# Patient Record
Sex: Female | Born: 1985 | Hispanic: No | Marital: Single | Smoking: Never smoker
Health system: Southern US, Community
[De-identification: ages and names within clinical notes are randomized; demographics above are authoritative.]

## PROBLEM LIST (undated history)

## (undated) ENCOUNTER — Inpatient Hospital Stay: Payer: Self-pay

## (undated) DIAGNOSIS — Z6741 Type O blood, Rh negative: Secondary | ICD-10-CM

## (undated) DIAGNOSIS — T7840XA Allergy, unspecified, initial encounter: Secondary | ICD-10-CM

## (undated) DIAGNOSIS — R519 Headache, unspecified: Secondary | ICD-10-CM

## (undated) DIAGNOSIS — O09899 Supervision of other high risk pregnancies, unspecified trimester: Secondary | ICD-10-CM

## (undated) DIAGNOSIS — Z803 Family history of malignant neoplasm of breast: Secondary | ICD-10-CM

## (undated) DIAGNOSIS — O26899 Other specified pregnancy related conditions, unspecified trimester: Secondary | ICD-10-CM

## (undated) DIAGNOSIS — R51 Headache: Secondary | ICD-10-CM

## (undated) DIAGNOSIS — O99210 Obesity complicating pregnancy, unspecified trimester: Secondary | ICD-10-CM

## (undated) DIAGNOSIS — Z98891 History of uterine scar from previous surgery: Secondary | ICD-10-CM

## (undated) DIAGNOSIS — Z6791 Unspecified blood type, Rh negative: Secondary | ICD-10-CM

## (undated) HISTORY — DX: Type O blood, Rh negative: Z67.41

## (undated) HISTORY — DX: Obesity complicating pregnancy, unspecified trimester: O99.210

## (undated) HISTORY — DX: Allergy, unspecified, initial encounter: T78.40XA

## (undated) HISTORY — DX: Other specified pregnancy related conditions, unspecified trimester: O26.899

## (undated) HISTORY — DX: Supervision of other high risk pregnancies, unspecified trimester: O09.899

## (undated) HISTORY — DX: Family history of malignant neoplasm of breast: Z80.3

## (undated) HISTORY — DX: Unspecified blood type, rh negative: Z67.91

## (undated) HISTORY — DX: History of uterine scar from previous surgery: Z98.891

---

## 2014-11-25 ENCOUNTER — Emergency Department: Payer: Self-pay | Admitting: Emergency Medicine

## 2015-05-23 ENCOUNTER — Ambulatory Visit (INDEPENDENT_AMBULATORY_CARE_PROVIDER_SITE_OTHER): Payer: 59 | Admitting: Family Medicine

## 2015-05-23 ENCOUNTER — Encounter: Payer: Self-pay | Admitting: Family Medicine

## 2015-05-23 VITALS — BP 118/82 | HR 79 | Temp 98.1°F | Resp 16 | Ht 65.25 in | Wt 190.7 lb

## 2015-05-23 DIAGNOSIS — N926 Irregular menstruation, unspecified: Secondary | ICD-10-CM | POA: Insufficient documentation

## 2015-05-23 DIAGNOSIS — Z2821 Immunization not carried out because of patient refusal: Secondary | ICD-10-CM

## 2015-05-23 DIAGNOSIS — Z572 Occupational exposure to dust: Secondary | ICD-10-CM

## 2015-05-23 MED ORDER — FLUTICASONE FUROATE-VILANTEROL 100-25 MCG/INH IN AEPB: 1.0000 | INHALATION_SPRAY | Freq: Every day | RESPIRATORY_TRACT | Status: DC

## 2015-05-23 MED ORDER — MONTELUKAST SODIUM 10 MG PO TABS: 10.0000 mg | ORAL_TABLET | Freq: Every day | ORAL | Status: DC

## 2015-05-23 NOTE — Progress Notes (Signed)
Name: Loretta Jensen   MRN: [id number]    DOB: [birth year]   Date:05/23/2015       Progress Note  Subjective  Chief Complaint  Chief Complaint  Patient presents with  . Establish Care  . Breathing Problem    patient has had some issues breathing at her place of employment. she stated that she works aroung a lot of dust and had some shortness of breath.    HPI  Loretta Jensen is a 29 year old female who is here today to establish care. She reports no significant past medical chronic conditions. Denies history of asthma but does have seasonal allergies which changes in season. Works at [employer] with frequent aerosols of particles, including dust, and chemicals. Does not wear masks. For the past 1-2 weeks has had periods of chest tightness and difficulty taking deep satisfying breaths only while at work. Works at [employer] shifts. Not associated with chest pain, palpitations, dizziness, syncope, cough, sinus drainage.  In addition she has had more stress at work. During the month of July this year she had her menses twice, regular flow without clotting. Has had her thyroid checked 2 years ago normal.  She has also had a CXR that was normal 11/2014.   Active Ambulatory Problems    Diagnosis Date Noted  . Occupational exposure to dust 05/23/2015  . Irregular menstrual bleeding 05/23/2015  . Obesity, Class I, BMI 30-34.9 05/23/2015  . Influenza vaccination declined by patient 05/23/2015   Resolved Ambulatory Problems    Diagnosis Date Noted  . No Resolved Ambulatory Problems   Past Medical History  Diagnosis Date  . Allergy    Social History  Substance Use Topics  . Smoking status: Never Smoker   . Smokeless tobacco: Not on file  . Alcohol Use: 0.0 oz/week    0 Standard drinks or equivalent per week     Comment: occasionally     Current outpatient prescriptions:  .  diphenhydrAMINE (BENADRYL) 25 MG tablet, Take 25 mg by mouth every 6 (six) hours as needed., Disp: ,  Rfl:   Past Surgical History  Procedure Laterality Date  . Cesarean section  2009    Family History  Problem Relation Age of Onset  . Cancer Mother     No Known Allergies   Review of Systems  CONSTITUTIONAL: No significant weight changes, fever, chills, weakness or fatigue.  HEENT:  - Eyes: No visual changes.  - Ears: No auditory changes. No pain.  - Nose: No sneezing, congestion, runny nose. - Throat: No sore throat. No changes in swallowing. SKIN: No rash or itching.  CARDIOVASCULAR: No chest pain, chest pressure or chest discomfort. No palpitations or edema.  RESPIRATORY: No shortness of breath, cough or sputum.  GASTROINTESTINAL: No anorexia, nausea, vomiting. No changes in bowel habits. No abdominal pain or blood.  GENITOURINARY: No dysuria. No frequency. No discharge.  NEUROLOGICAL: No headache, dizziness, syncope, paralysis, ataxia, numbness or tingling in the extremities. No memory changes. No change in bowel or bladder control.  MUSCULOSKELETAL: No joint pain. No muscle pain. HEMATOLOGIC: No anemia, bleeding or bruising.  LYMPHATICS: No enlarged lymph nodes.  PSYCHIATRIC: No change in mood. No change in sleep pattern.  ENDOCRINOLOGIC: No reports of sweating, cold or heat intolerance. No polyuria or polydipsia.     Objective  BP 118/82 mmHg  Pulse 79  Temp(Src) 98.1 F (36.7 C) (Oral)  Resp 16  Ht 5' 5.25" (1.657 m)  Wt 190 lb 11.2 oz (86.501 kg)  BMI 31.50 kg/m2  SpO2 98%  LMP 04/29/2015 (Approximate) Body mass index is 31.5 kg/(m^2).  Physical Exam  Constitutional: Patient is overweight and well-nourished. In no distress.  HEENT:  - Head: Normocephalic and atraumatic.  - Ears: Bilateral TMs gray, no erythema or effusion - Nose: Nasal mucosa moist, boggy. - Mouth/Throat: Oropharynx is clear and moist. No tonsillar hypertrophy or erythema. No post nasal drainage.  - Eyes: Conjunctivae clear, EOM movements normal. PERRLA. No scleral icterus.  Neck:  Normal range of motion. Neck supple. No JVD present. No thyromegaly present.  Cardiovascular: Normal rate, regular rhythm and normal heart sounds.  No murmur heard.  Pulmonary/Chest: Effort normal and breath sounds normal. No respiratory distress. Musculoskeletal: Normal range of motion bilateral UE and LE, no joint effusions. Peripheral vascular: Bilateral LE no edema. Neurological: CN II-XII grossly intact with no focal deficits. Alert and oriented to person, place, and time. Coordination, balance, strength, speech and gait are normal.  Skin: Skin is warm and dry. No rash noted. No erythema.  Psychiatric: Patient has a normal mood and affect. Behavior is normal in office today. Judgment and thought content normal in office today.   Assessment & Plan  1. Occupational exposure to dust Recommend wearing mask while at work. Clinical exam unremarkable. May have irritative inflammation within lung field will start her on inhaled corticosteroid for 2 weeks and wean off.  Start singulair daily.  If symptoms continue will get CXR and PFTs.  - Fluticasone Furoate-Vilanterol 100-25 MCG/INH AEPB; Inhale 1 puff into the lungs daily.  Dispense: 60 each; Refill: 1 - montelukast (SINGULAIR) 10 MG tablet; Take 1 tablet (10 mg total) by mouth at bedtime.  Dispense: 30 tablet; Refill: 3  2. Irregular menstrual bleeding Likely stress related as they have normalized now. If recurrent will get lab work.

## 2015-06-20 ENCOUNTER — Encounter: Payer: 59 | Admitting: Family Medicine

## 2015-06-28 ENCOUNTER — Ambulatory Visit
Admission: RE | Admit: 2015-06-28 | Discharge: 2015-06-28 | Disposition: A | Discharge status: Home / Self Care | Payer: 59 | Source: Ambulatory Visit | Attending: Family Medicine | Admitting: Family Medicine

## 2015-06-28 ENCOUNTER — Encounter: Payer: Self-pay | Admitting: Family Medicine

## 2015-06-28 ENCOUNTER — Telehealth: Payer: Self-pay

## 2015-06-28 ENCOUNTER — Encounter (INDEPENDENT_AMBULATORY_CARE_PROVIDER_SITE_OTHER): Payer: Self-pay

## 2015-06-28 ENCOUNTER — Ambulatory Visit (INDEPENDENT_AMBULATORY_CARE_PROVIDER_SITE_OTHER): Payer: 59 | Admitting: Family Medicine

## 2015-06-28 VITALS — BP 122/68 | HR 83 | Temp 98.7°F | Resp 16 | Ht 65.0 in | Wt 194.7 lb

## 2015-06-28 DIAGNOSIS — Z13220 Encounter for screening for lipoid disorders: Secondary | ICD-10-CM

## 2015-06-28 DIAGNOSIS — R05 Cough: Secondary | ICD-10-CM | POA: Diagnosis present

## 2015-06-28 DIAGNOSIS — E669 Obesity, unspecified: Secondary | ICD-10-CM

## 2015-06-28 DIAGNOSIS — R221 Localized swelling, mass and lump, neck: Secondary | ICD-10-CM

## 2015-06-28 DIAGNOSIS — Z124 Encounter for screening for malignant neoplasm of cervix: Secondary | ICD-10-CM

## 2015-06-28 DIAGNOSIS — R059 Cough, unspecified: Secondary | ICD-10-CM | POA: Insufficient documentation

## 2015-06-28 DIAGNOSIS — Z136 Encounter for screening for cardiovascular disorders: Secondary | ICD-10-CM

## 2015-06-28 DIAGNOSIS — Z113 Encounter for screening for infections with a predominantly sexual mode of transmission: Secondary | ICD-10-CM

## 2015-06-28 DIAGNOSIS — IMO0001 Reserved for inherently not codable concepts without codable children: Secondary | ICD-10-CM

## 2015-06-28 DIAGNOSIS — Z0000 Encounter for general adult medical examination without abnormal findings: Secondary | ICD-10-CM | POA: Diagnosis not present

## 2015-06-28 DIAGNOSIS — R09A2 Foreign body sensation, throat: Secondary | ICD-10-CM

## 2015-06-28 DIAGNOSIS — E66811 Obesity, class 1: Secondary | ICD-10-CM

## 2015-06-28 NOTE — Telephone Encounter (Signed)
Patient returned my call so that I could review Dr. Sherley Bounds' message:  Notes Recorded by Edwena Felty, MD on 06/28/2015 at 1:09 PM Let patient know CXR and Neck X-ray were normal did not show mass or swelling to explain the lump in her throat sensation. I want her to start claritin, zyrtec or allegra found OTC daily, gargle with warm salt water or mouth wash twice daily. If symptoms do not improve call me and I will refer her to ENT specialist to look down her throat.  Patient agreed and said ok. Sh also mentioned that she will have her blood work done on this Friday.

## 2015-06-28 NOTE — Progress Notes (Signed)
Name: Loretta Jensen   MRN: [id number]    DOB: 1986/06/10   Date:06/28/2015       Progress Note  Subjective  Chief Complaint  Chief Complaint  Patient presents with  . Annual Exam    HPI  Patient is here today for a Complete Female Physical Exam:  The patient has has no unusual complaints and complains of lump in throat sensation still. Overall feels healthy. Diet is well balanced. In general does not exercise regularly. Sees dentist regularly and addresses vision concerns with ophthalmologist if applicable. In regards to sexual activity the patient is currently sexually active. Currently is concerned about exposure to any STDs.   Menstrual history is positive for irregular menses.   If you recall, Letica works at [employer] with frequent aerosols of particles, including dust, and chemicals. Does not wear masks but at our last visit she was encouraged to wear a mask while working which she reports has improved her chest tightness and breathing. Works at [employer] shifts usually but recently this has changed.  She is complaining of lump sensation down her throat at the base of her neck. Not associated with chest pain, palpitations, dizziness, syncope, choking. In addition she has had more stress at work. Has had her thyroid checked 2 years ago normal. She has also had a CXR that was normal 11/2014.   Past Medical History  Diagnosis Date  . Allergy     Past Surgical History  Procedure Laterality Date  . Cesarean section  2009    Family History  Problem Relation Age of Onset  . Cancer Mother     Social History   Social History  . Marital Status: Single    Spouse Name: N/A  . Number of Children: N/A  . Years of Education: N/A   Occupational History  . Not on file.   Social History Main Topics  . Smoking status: Never Smoker   . Smokeless tobacco: Not on file  . Alcohol Use: 0.0 oz/week    0 Standard drinks or equivalent per week     Comment: occasionally  .  Drug Use: No  . Sexual Activity:    Partners: Male   Other Topics Concern  . Not on file   Social History Narrative     Current outpatient prescriptions:  .  diphenhydrAMINE (BENADRYL) 25 MG tablet, Take 25 mg by mouth every 6 (six) hours as needed., Disp: , Rfl:  .  Fluticasone Furoate-Vilanterol 100-25 MCG/INH AEPB, Inhale 1 puff into the lungs daily., Disp: 60 each, Rfl: 1 .  montelukast (SINGULAIR) 10 MG tablet, Take 1 tablet (10 mg total) by mouth at bedtime., Disp: 30 tablet, Rfl: 3  No Known Allergies  ROS  CONSTITUTIONAL: No significant weight changes, fever, chills, weakness or fatigue.  HEENT:  - Eyes: No visual changes.  - Ears: No auditory changes. No pain.  - Nose: No sneezing, congestion, runny nose. - Throat: No sore throat. No changes in swallowing. Lump sensation in throat. SKIN: No rash or itching.  CARDIOVASCULAR: No chest pain, chest pressure or chest discomfort. No palpitations or edema.  RESPIRATORY: No shortness of breath, cough or sputum.  GASTROINTESTINAL: No anorexia, nausea, vomiting. No changes in bowel habits. No abdominal pain or blood.  GENITOURINARY: No dysuria. No frequency. No discharge.  NEUROLOGICAL: No headache, dizziness, syncope, paralysis, ataxia, numbness or tingling in the extremities. No memory changes. No change in bowel or bladder control.  MUSCULOSKELETAL: No joint pain. No muscle pain. HEMATOLOGIC: No  anemia, bleeding or bruising.  LYMPHATICS: No enlarged lymph nodes.  PSYCHIATRIC: No change in mood. No change in sleep pattern.  ENDOCRINOLOGIC: No reports of sweating, cold or heat intolerance. No polyuria or polydipsia.   Objective  Filed Vitals:   06/28/15 1110  BP: 122/68  Pulse: 83  Temp: 98.7 F (37.1 C)  TempSrc: Oral  Resp: 16  Height:  (1.651 m)  Weight: 194 lb 11.2 oz (88.315 kg)  SpO2: 98%   Body mass index is 32.4 kg/(m^2).  Depression screen [facility] 2/9 06/28/2015 05/23/2015  Decreased Interest 0 0  Down,  Depressed, Hopeless 0 0  PHQ - 2 Score 0 0     Physical Exam  Constitutional: Patient obese and well-nourished. In no distress.  HEENT:  - Head: Normocephalic and atraumatic.  - Ears: Bilateral TMs gray, no erythema or effusion - Nose: Nasal mucosa moist - Mouth/Throat: Oropharynx is clear and moist. No tonsillar hypertrophy or erythema. Positive for tonsil stones. No post nasal drainage.  - Eyes: Conjunctivae clear, EOM movements normal. PERRLA. No scleral icterus.  Neck: Normal range of motion. Neck supple. No JVD present. No thyromegaly present.  Cardiovascular: Normal rate, regular rhythm and normal heart sounds.  No murmur heard.  Pulmonary/Chest: Effort normal and breath sounds normal. No respiratory distress. Abdominal: Soft. Bowel sounds are normal, no distension. There is no tenderness. no masses BREAST: Bilateral breast exam normal with no masses, skin changes or nipple discharge FEMALE GENITALIA:  External genitalia normal External urethra normal Vaginal vault normal without discharge or lesions Cervix normal without discharge or lesions Bimanual exam normal without masses RECTAL: no rectal masses or hemorrhoids Musculoskeletal: Normal range of motion bilateral UE and LE, no joint effusions. Peripheral vascular: Bilateral LE no edema. Neurological: CN II-XII grossly intact with no focal deficits. Alert and oriented to person, place, and time. Coordination, balance, strength, speech and gait are normal.  Skin: Skin is warm and dry. No rash noted. No erythema.  Psychiatric: Patient has a normal mood and affect. Behavior is normal in office today. Judgment and thought content normal in office today.   Assessment & Plan  1. Annual physical exam Discussed long term preventative goals.   2. Encounter for screening for malignant neoplasm of cervix  - Pap IG w/ reflex to HPV when ASC-U  3. Encounter for cholesteral screening for cardiovascular disease  - Lipid  panel  4. Screening for STD (sexually transmitted disease)  - Chlamydia/Gonococcus/Trichomonas, NAA - HIV antibody (with reflex)  5. Cough Not so much a classical cough more like irritation in her throat that makes her want to cough or clear her throat. Will get CXR and neck X-ray. If symptoms ongoing may benefit from ENT evaluation.  - CBC with Differential/Platelet - Comprehensive metabolic panel - TSH - DG Chest 2 View; Future - DG Neck Soft Tissue; Future  6. Obesity, Class I, BMI 30-34.9 The patient has been counseled on their higher than normal BMI.  They have verbally expressed understanding their increased risk for other diseases.  In efforts to meet a better target BMI goal the patient has been counseled on lifestyle, diet and exercise modification tactics. Start with moderate intensity aerobic exercise (walking, jogging, elliptical, swimming, group or individual sports, hiking) at least a day at least 4 days a week and increase intensity, duration, frequency as tolerated. Diet should include well balance fresh fruits and vegetables avoiding processed foods, carbohydrates and sugars. Drink at least 8oz 10 glasses a day avoiding sodas,  sugary fruit drinks, sweetened tea. Check weight on a reliable scale daily and monitor weight loss progress daily. Consider investing in mobile phone apps that will help keep track of weight loss goals.  - TSH  7. Sensation of lump in throat Will get CXR and neck X-ray. If symptoms ongoing may benefit from ENT evaluation.  - DG Neck Soft Tissue; Future

## 2015-06-30 LAB — CHLAMYDIA/GONOCOCCUS/TRICHOMONAS, NAA
Chlamydia by NAA: NEGATIVE
Gonococcus by NAA: NEGATIVE
Trich vag by NAA: NEGATIVE

## 2015-07-01 LAB — LIPID PANEL
CHOL/HDL RATIO: 2.3 ratio (ref 0.0–4.4)
CHOLESTEROL TOTAL: 131 mg/dL (ref 100–199)
HDL: 57 mg/dL (ref >39)
LDL CALC: 63 mg/dL (ref 0–99)
TRIGLYCERIDES: 55 mg/dL (ref 0–149)
VLDL Cholesterol Cal: 11 mg/dL (ref 5–40)

## 2015-07-01 LAB — COMPREHENSIVE METABOLIC PANEL
ALK PHOS: 60 IU/L (ref 39–117)
ALT: 16 IU/L (ref 0–32)
AST: 14 IU/L (ref 0–40)
Albumin/Globulin Ratio: 1.4 (ref 1.1–2.5)
Albumin: 3.9 g/dL (ref 3.5–5.5)
BUN/Creatinine Ratio: 15 (ref 8–20)
BUN: 10 mg/dL (ref 6–20)
Bilirubin Total: 0.3 mg/dL (ref 0.0–1.2)
CALCIUM: 9 mg/dL (ref 8.7–10.2)
CO2: 23 mmol/L (ref 18–29)
CREATININE: 0.67 mg/dL (ref 0.57–1.00)
Chloride: 102 mmol/L (ref 97–108)
GFR calc Af Amer: 139 mL/min/{1.73_m2} (ref >59)
GFR, EST NON AFRICAN AMERICAN: 121 mL/min/{1.73_m2} (ref >59)
Globulin, Total: 2.8 g/dL (ref 1.5–4.5)
Glucose: 76 mg/dL (ref 65–99)
POTASSIUM: 3.9 mmol/L (ref 3.5–5.2)
Sodium: 141 mmol/L (ref 134–144)
Total Protein: 6.7 g/dL (ref 6.0–8.5)

## 2015-07-01 LAB — CBC WITH DIFFERENTIAL/PLATELET
BASOS ABS: 0 10*3/uL (ref 0.0–0.2)
Basos: 0 %
EOS (ABSOLUTE): 0.1 10*3/uL (ref 0.0–0.4)
Eos: 1 %
Hematocrit: 35.4 % (ref 34.0–46.6)
Hemoglobin: 11.8 g/dL (ref 11.1–15.9)
IMMATURE GRANULOCYTES: 1 %
Immature Grans (Abs): 0.1 10*3/uL (ref 0.0–0.1)
Lymphocytes Absolute: 2.4 10*3/uL (ref 0.7–3.1)
Lymphs: 24 %
MCH: 27.1 pg (ref 26.6–33.0)
MCHC: 33.3 g/dL (ref 31.5–35.7)
MCV: 81 fL (ref 79–97)
MONOS ABS: 0.5 10*3/uL (ref 0.1–0.9)
Monocytes: 5 %
NEUTROS PCT: 69 %
Neutrophils Absolute: 7.2 10*3/uL — ABNORMAL HIGH (ref 1.4–7.0)
PLATELETS: 311 10*3/uL (ref 150–379)
RBC: 4.36 x10E6/uL (ref 3.77–5.28)
RDW: 14.3 % (ref 12.3–15.4)
WBC: 10.3 10*3/uL (ref 3.4–10.8)

## 2015-07-01 LAB — HIV ANTIBODY (ROUTINE TESTING W REFLEX): HIV SCREEN 4TH GENERATION: NONREACTIVE

## 2015-07-01 LAB — TSH: TSH: 1.4 u[IU]/mL (ref 0.450–4.500)

## 2015-07-02 LAB — PAP IG W/ RFLX HPV ASCU: PAP SMEAR COMMENT: 0

## 2015-09-01 ENCOUNTER — Ambulatory Visit (INDEPENDENT_AMBULATORY_CARE_PROVIDER_SITE_OTHER): Payer: 59

## 2015-09-01 DIAGNOSIS — Z23 Encounter for immunization: Secondary | ICD-10-CM

## 2015-09-29 ENCOUNTER — Ambulatory Visit (INDEPENDENT_AMBULATORY_CARE_PROVIDER_SITE_OTHER): Payer: 59 | Admitting: Family Medicine

## 2015-09-29 ENCOUNTER — Encounter: Payer: Self-pay | Admitting: Family Medicine

## 2015-09-29 ENCOUNTER — Telehealth: Payer: Self-pay | Admitting: Family Medicine

## 2015-09-29 VITALS — BP 118/82 | HR 89 | Temp 98.4°F | Resp 16 | Ht 65.0 in | Wt 201.7 lb

## 2015-09-29 DIAGNOSIS — J0100 Acute maxillary sinusitis, unspecified: Secondary | ICD-10-CM | POA: Diagnosis not present

## 2015-09-29 DIAGNOSIS — R059 Cough, unspecified: Secondary | ICD-10-CM

## 2015-09-29 DIAGNOSIS — R05 Cough: Secondary | ICD-10-CM | POA: Diagnosis not present

## 2015-09-29 DIAGNOSIS — H65112 Acute and subacute allergic otitis media (mucoid) (sanguinous) (serous), left ear: Secondary | ICD-10-CM

## 2015-09-29 MED ORDER — AMOXICILLIN-POT CLAVULANATE 875-125 MG PO TABS: 1.0000 | ORAL_TABLET | Freq: Two times a day (BID) | ORAL | Status: DC

## 2015-09-29 MED ORDER — BENZONATATE 200 MG PO CAPS: 200.0000 mg | ORAL_CAPSULE | Freq: Three times a day (TID) | ORAL | Status: DC | PRN

## 2015-09-29 NOTE — Progress Notes (Signed)
Name: Loretta Jensen   MRN: [id number]    DOB: 1986-01-21   Date:09/29/2015       Progress Note  Subjective  Chief Complaint  Chief Complaint  Patient presents with  . URI    HPI  Patient is here today with concerns regarding the following symptoms sore throat, congestion, post nasal drip, sneezing, ear pressure, sinus pressure, productive cough, achiness and low grade fevers that started 3 days ago.  Associated with chills, sweats, fatigue and anorexia. Has tried the following home remedies: Mucinex, Robatussion, Nyquil.   Past Medical History  Diagnosis Date  . Allergy     Social History  Substance Use Topics  . Smoking status: Never Smoker   . Smokeless tobacco: Not on file  . Alcohol Use: 0.0 oz/week    0 Standard drinks or equivalent per week     Comment: occasionally     Current outpatient prescriptions:  .  diphenhydrAMINE (BENADRYL) 25 MG tablet, Take 25 mg by mouth every 6 (six) hours as needed., Disp: , Rfl:  .  Fluticasone Furoate-Vilanterol 100-25 MCG/INH AEPB, Inhale 1 puff into the lungs daily., Disp: 60 each, Rfl: 1 .  montelukast (SINGULAIR) 10 MG tablet, Take 1 tablet (10 mg total) by mouth at bedtime., Disp: 30 tablet, Rfl: 3  No Known Allergies  ROS  Positive for fatigue, nasal congestion, sinus pressure, ear fullness, cough as mentioned in HPI, otherwise all systems reviewed and are negative.  Objective  Filed Vitals:   09/29/15 1153  BP: 118/82  Pulse: 89  Temp: 98.4 F (36.9 C)  TempSrc: Oral  Resp: 16  Height: 5\' 5"  (1.651 m)  Weight: 201 lb 11.2 oz (91.491 kg)  SpO2: 98%   Body mass index is 33.56 kg/(m^2).   Physical Exam  Constitutional: Patient appears overweight and well-nourished. In no acute distress but does appear to be fatigued from acute illness. HEENT:  - Head: Normocephalic and atraumatic.  - Ears: RIGHT TM bulging with minimal clear exudate, LEFT TM mild erythema with bulging and fluid build up - Nose: Nasal  mucosa boggy and congested.  - Mouth/Throat: Oropharynx is moist with slight erythema of bilateral tonsils without hypertrophy or exudates. Post nasal drainage present.  - Eyes: Conjunctivae clear, EOM movements normal. PERRLA. No scleral icterus.  Neck: Normal range of motion. Neck supple. No JVD present. No thyromegaly present. No local lymphadenopathy. Cardiovascular: Regular rate, regular rhythm with no murmurs heard.  Pulmonary/Chest: Effort normal and breath sounds clear in all lung fields.  Musculoskeletal: Normal range of motion bilateral UE and LE, no joint effusions. Skin: Skin is warm and dry. No rash noted. Psychiatric: Patient has a normal mood and affect. Behavior is normal in office today. Judgment and thought content normal in office today.   Assessment & Plan  1. Acute allergic otitis media of left ear, recurrence not specified Etiologies include initial allergic rhinitis or viral infection progressing to superimposed bacterial infection. Instructed patient on increasing hydration, nasal saline spray, steam inhalation, NSAID if tolerated and not contraindicated. If not already doing so start taking daily anti-histamine and use a steroid nasal spray. If symptoms persist/worsen may consider antibiotic therapy.  - amoxicillin-clavulanate (AUGMENTIN) 875-125 MG tablet; Take 1 tablet by mouth 2 (two) times daily.  Dispense: 20 tablet; Refill: 0  2. Acute maxillary sinusitis, recurrence not specified  - amoxicillin-clavulanate (AUGMENTIN) 875-125 MG tablet; Take 1 tablet by mouth 2 (two) times daily.  Dispense: 20 tablet; Refill: 0  3. Cough  - benzonatate (  TESSALON) 200 MG capsule; Take 1 capsule (200 mg total) by mouth 3 (three) times daily as needed for cough.  Dispense: 30 capsule; Refill: 0

## 2015-09-29 NOTE — Telephone Encounter (Signed)
Contacted  Patient, she wanted to see if she could get antibiotic in liquid form.  I told her Dr. Sherley BoundsSundaram had already left for the weekend and might not get to her messages until next week.

## 2015-09-29 NOTE — Telephone Encounter (Signed)
Patient stated that she was just seen but had a question about her medication.

## 2016-07-30 LAB — OB RESULTS CONSOLE HGB/HCT, BLOOD
HCT: 36 %
Hemoglobin: 12.1 g/dL

## 2016-07-30 LAB — SICKLE CELL SCREEN: Sickle Cell Screen: NORMAL

## 2016-07-30 LAB — OB RESULTS CONSOLE ABO/RH: RH TYPE: NEGATIVE

## 2016-07-30 LAB — OB RESULTS CONSOLE PLATELET COUNT: Platelets: 300 10*3/uL

## 2016-07-30 LAB — OB RESULTS CONSOLE RPR: RPR: NONREACTIVE

## 2016-07-30 LAB — OB RESULTS CONSOLE RUBELLA ANTIBODY, IGM: RUBELLA: IMMUNE

## 2016-07-30 LAB — OB RESULTS CONSOLE HEPATITIS B SURFACE ANTIGEN: Hepatitis B Surface Ag: NEGATIVE

## 2016-07-30 LAB — HM PAP SMEAR

## 2016-07-30 LAB — OB RESULTS CONSOLE GC/CHLAMYDIA
Chlamydia: NEGATIVE
Gonorrhea: NEGATIVE

## 2016-07-30 LAB — OB RESULTS CONSOLE VARICELLA ZOSTER ANTIBODY, IGG: VARICELLA IGG: IMMUNE

## 2016-07-30 LAB — OB RESULTS CONSOLE HIV ANTIBODY (ROUTINE TESTING): HIV: NONREACTIVE

## 2016-11-27 ENCOUNTER — Telehealth: Payer: Self-pay

## 2016-11-27 NOTE — Telephone Encounter (Signed)
Pt is currently 26 wks. C/o pelvic pain and hurts to stand. Left msg for pt to call back.

## 2016-11-27 NOTE — Telephone Encounter (Signed)
Pt states she had a sharp, throbbing pain in stomach area on Tuesday morning around 3:00 or 4:00 but has since subsided. She works 12 hour shifts. Advised hydration, rest and maternity support belt if needed. Pt also c/o nausea. Pt encouraged to RTO prior to next appt if sxs worsen or persist.

## 2016-11-29 ENCOUNTER — Telehealth: Payer: Self-pay

## 2016-11-29 NOTE — Telephone Encounter (Signed)
Pt called triage again today (see previous task from 3/7) with same pelvic S&S. She is an Ob, Please call and schedule an appointment for today. Cb# [phone]

## 2016-12-02 NOTE — Telephone Encounter (Signed)
Pt called Frk.  Is still waiting on callback.

## 2016-12-02 NOTE — Telephone Encounter (Signed)
Attemped to call and schedule pt. Voicemail box not set up.

## 2016-12-03 NOTE — Telephone Encounter (Signed)
Pt calling triage again. Requests call back on work number to schedule appt at [phone] thank you

## 2016-12-04 ENCOUNTER — Ambulatory Visit (INDEPENDENT_AMBULATORY_CARE_PROVIDER_SITE_OTHER): Payer: 59 | Admitting: Obstetrics & Gynecology

## 2016-12-04 ENCOUNTER — Encounter: Payer: Self-pay | Admitting: Obstetrics & Gynecology

## 2016-12-04 VITALS — BP 110/68 | HR 86 | Wt 190.0 lb

## 2016-12-04 DIAGNOSIS — Z3A27 27 weeks gestation of pregnancy: Secondary | ICD-10-CM

## 2016-12-04 DIAGNOSIS — Z98891 History of uterine scar from previous surgery: Secondary | ICD-10-CM

## 2016-12-04 DIAGNOSIS — N3000 Acute cystitis without hematuria: Secondary | ICD-10-CM

## 2016-12-04 DIAGNOSIS — R1030 Lower abdominal pain, unspecified: Secondary | ICD-10-CM | POA: Diagnosis not present

## 2016-12-04 HISTORY — DX: History of uterine scar from previous surgery: Z98.891

## 2016-12-04 LAB — POCT URINALYSIS DIPSTICK
Bilirubin, UA: NEGATIVE
Clarity, UA: NEGATIVE
GLUCOSE UA: NEGATIVE
Ketones, UA: NEGATIVE
NITRITE UA: NEGATIVE
PH UA: 6.5
Protein, UA: NEGATIVE
RBC UA: NEGATIVE
Spec Grav, UA: 1.01
UROBILINOGEN UA: 0.2

## 2016-12-04 MED ORDER — NITROFURANTOIN MONOHYD MACRO 100 MG PO CAPS: 100.0000 mg | ORAL_CAPSULE | Freq: Two times a day (BID) | ORAL | 1 refills | Status: DC

## 2016-12-04 NOTE — Addendum Note (Signed)
Addended by: Nadara MustardHARRIS, Paxten Appelt P on: 12/04/2016 10:20 AM   Modules accepted: Orders

## 2016-12-04 NOTE — Telephone Encounter (Signed)
Pt is scheduled for 3/14 w/Harris

## 2016-12-04 NOTE — Progress Notes (Addendum)
Gynecology Pelvic Pain Evaluation   Chief Complaint:  Chief Complaint  Patient presents with  . rob    nausea, sharp pain in vagina    History of Present Illness:   Patient is a 31 y.o. G2P1 who LMP was Patient's last menstrual period was 05/29/2016., presents today for a problem visit.  She complains of pain.   Her pain is localized to the vaginal area that radiates up to her lower abdomen; pregnant [redacted] weeks w h/o prior CS area, described as intermittent and sharp, began a week ago and its severity is described as severe, at times. The pain radiates to the  Non-radiating. She has these associated symptoms which include headache. Patient has these modifiers which include nothing that make it better and unable to associate with any factor that make it worse.  Context includes: spontaneous.    PMHx: She  has a past medical history of Allergy. Also,  has a past surgical history that includes Cesarean section (2009)., family history includes Cancer in her mother.,  reports that she has never smoked. She does not have any smokeless tobacco history on file. She reports that she drinks alcohol. She reports that she does not use drugs.  She has a current medication list which includes the following prescription(s): amoxicillin-clavulanate, benzonatate, diphenhydramine, fluticasone furoate-vilanterol, montelukast, and nitrofurantoin (macrocrystal-monohydrate). Also, has No Known Allergies.  Review of Systems  Constitutional: Negative for chills, fever and malaise/fatigue.  HENT: Negative for congestion, sinus pain and sore throat.   Eyes: Negative for blurred vision and pain.  Respiratory: Negative for cough and wheezing.   Cardiovascular: Negative for chest pain and leg swelling.  Gastrointestinal: Negative for abdominal pain, constipation, diarrhea, heartburn, nausea and vomiting.  Genitourinary: Negative for dysuria, frequency, hematuria and urgency.  Musculoskeletal: Negative for back pain,  joint pain, myalgias and neck pain.  Skin: Negative for itching and rash.  Neurological: Negative for dizziness, tremors and weakness.  Endo/Heme/Allergies: Does not bruise/bleed easily.  Psychiatric/Behavioral: Negative for depression. The patient is not nervous/anxious and does not have insomnia.     Objective: BP 110/68   Pulse 86   Wt 190 lb (86.2 kg)   LMP 05/29/2016   BMI 31.62 kg/m  Physical Exam  Constitutional: She is oriented to person, place, and time. She appears well-developed and well-nourished. No distress.  Genitourinary: Rectum normal and vagina normal. Pelvic exam was performed with patient supine. There is no rash or lesion on the right labia. There is no rash or lesion on the left labia. Vagina exhibits no lesion. No bleeding in the vagina.  Cardiovascular: Normal rate.   Pulmonary/Chest: Effort normal.  Abdominal: Soft. Bowel sounds are normal. She exhibits no distension. There is no tenderness. There is no rebound.  Musculoskeletal: Normal range of motion.  Neurological: She is alert and oriented to person, place, and time.  Skin: Skin is warm and dry.  Psychiatric: She has a normal mood and affect.  Vitals reviewed. Cervix closed thick and high Female chaperone present for pelvic portion of the physical exam FHT 140s  Results for orders placed or performed in visit on 12/04/16  POCT urinalysis dipstick  Result Value Ref Range   Color, UA yellow    Clarity, UA neg    Glucose, UA neg    Bilirubin, UA neg    Ketones, UA neg    Spec Grav, UA 1.010 1.003, 1.005, 1.010, 1.015, 1.020, 1.025, 1.030, 1.035   Blood, UA neg    pH, UA 6.5  5.0, 5.5, 6.0, 6.5, 7.0, 7.5, 8.0   Protein, UA neg    Urobilinogen, UA 0.2 0.2, 1.0   Nitrite, UA neg    Leukocytes, UA moderate (2+) (A) Negative   Assessment: 31 y.o. G2P1 with Functional: lower abd and vag pain possibly related ot pregnancy; no s/sx PTL Pregnancy: Prior CS with risk for adhesions.  Problem List Items  Addressed This Visit      Other   History of cesarean delivery    Other Visit Diagnoses    Lower abdominal pain    -  Primary   [redacted] weeks gestation of pregnancy       Acute cystitis without hematuria       Relevant Medications   nitrofurantoin, macrocrystal-monohydrate, (MACROBID) 100 MG capsule    Macrobid ABX for UTI  Annamarie MajorPaul Praneel Haisley, MD, Merlinda FrederickFACOG [facility], [facility] 12/04/2016  10:11 AM

## 2016-12-04 NOTE — Patient Instructions (Signed)

## 2016-12-10 ENCOUNTER — Telehealth: Payer: Self-pay

## 2016-12-10 NOTE — Telephone Encounter (Signed)
Pt aware of [employer] Authoriz[employer] [facility] Copy made and placed on Tonyas desk for chart. KJ CMA

## 2016-12-13 ENCOUNTER — Ambulatory Visit (INDEPENDENT_AMBULATORY_CARE_PROVIDER_SITE_OTHER): Payer: 59 | Admitting: Certified Nurse Midwife

## 2016-12-13 ENCOUNTER — Other Ambulatory Visit: Payer: 59

## 2016-12-13 VITALS — BP 110/62 | Wt 194.0 lb

## 2016-12-13 DIAGNOSIS — O09899 Supervision of other high risk pregnancies, unspecified trimester: Secondary | ICD-10-CM

## 2016-12-13 DIAGNOSIS — Z3A28 28 weeks gestation of pregnancy: Secondary | ICD-10-CM

## 2016-12-13 DIAGNOSIS — Z131 Encounter for screening for diabetes mellitus: Secondary | ICD-10-CM | POA: Diagnosis not present

## 2016-12-13 DIAGNOSIS — Z6791 Unspecified blood type, Rh negative: Secondary | ICD-10-CM

## 2016-12-13 DIAGNOSIS — O0990 Supervision of high risk pregnancy, unspecified, unspecified trimester: Secondary | ICD-10-CM

## 2016-12-13 DIAGNOSIS — Z113 Encounter for screening for infections with a predominantly sexual mode of transmission: Secondary | ICD-10-CM

## 2016-12-13 DIAGNOSIS — Z98891 History of uterine scar from previous surgery: Secondary | ICD-10-CM

## 2016-12-13 DIAGNOSIS — O26899 Other specified pregnancy related conditions, unspecified trimester: Principal | ICD-10-CM

## 2016-12-13 DIAGNOSIS — O0993 Supervision of high risk pregnancy, unspecified, third trimester: Secondary | ICD-10-CM

## 2016-12-13 MED ORDER — RHO D IMMUNE GLOBULIN 1500 UNITS IM SOSY: 1500.0000 [IU] | PREFILLED_SYRINGE | Freq: Once | INTRAMUSCULAR | Status: DC

## 2016-12-13 NOTE — Progress Notes (Signed)
Sharpe pain starts in low stomach and shoots to vagina/28 week labs today/Rhogam given today KJ

## 2016-12-14 LAB — 28 WEEKS RH-PANEL
ANTIBODY SCREEN: NEGATIVE
BASOS ABS: 0 10*3/uL (ref 0.0–0.2)
Basos: 0 %
EOS (ABSOLUTE): 0.1 10*3/uL (ref 0.0–0.4)
Eos: 1 %
Gestational Diabetes Screen: 128 mg/dL (ref 65–139)
HIV Screen 4th Generation wRfx: NONREACTIVE
Hematocrit: 34 % (ref 34.0–46.6)
Hemoglobin: 11.4 g/dL (ref 11.1–15.9)
IMMATURE GRANS (ABS): 0.1 10*3/uL (ref 0.0–0.1)
IMMATURE GRANULOCYTES: 1 %
LYMPHS: 17 %
Lymphocytes Absolute: 1.6 10*3/uL (ref 0.7–3.1)
MCH: 28.3 pg (ref 26.6–33.0)
MCHC: 33.5 g/dL (ref 31.5–35.7)
MCV: 84 fL (ref 79–97)
MONOCYTES: 6 %
MONOS ABS: 0.5 10*3/uL (ref 0.1–0.9)
NEUTROS ABS: 6.9 10*3/uL (ref 1.4–7.0)
NEUTROS PCT: 75 %
PLATELETS: 252 10*3/uL (ref 150–379)
RBC: 4.03 x10E6/uL (ref 3.77–5.28)
RDW: 14.4 % (ref 12.3–15.4)
RPR Ser Ql: NONREACTIVE
WBC: 9.1 10*3/uL (ref 3.4–10.8)

## 2016-12-22 DIAGNOSIS — O0990 Supervision of high risk pregnancy, unspecified, unspecified trimester: Secondary | ICD-10-CM | POA: Insufficient documentation

## 2016-12-22 NOTE — Progress Notes (Signed)
Late note from 3/23/visit. Did not go to work yesterday due to intermittent lower abdominal/ shooting vaginal pains. No bleeding. No contractions. Finishing up antibiotics prescribed at last visit for UTI. Baby active.  History of prior CS for FTP. PROM/ labor augmented but did not progress past 3 cm. Desires VBAC. Explained risks of VBAC vs repeat CS. SHe is aware that labor would not be induced unless ripe cervix. Will schedule repeat CS at 40 weeks in case patient does not go into labor. ROB in 2 weeks.

## 2016-12-26 ENCOUNTER — Other Ambulatory Visit: Payer: Self-pay

## 2016-12-26 DIAGNOSIS — Z6741 Type O blood, Rh negative: Secondary | ICD-10-CM | POA: Insufficient documentation

## 2016-12-30 ENCOUNTER — Ambulatory Visit (INDEPENDENT_AMBULATORY_CARE_PROVIDER_SITE_OTHER): Payer: 59 | Admitting: Obstetrics and Gynecology

## 2016-12-30 VITALS — BP 114/70 | Wt 191.0 lb

## 2016-12-30 DIAGNOSIS — E669 Obesity, unspecified: Secondary | ICD-10-CM

## 2016-12-30 DIAGNOSIS — Z3A30 30 weeks gestation of pregnancy: Secondary | ICD-10-CM

## 2016-12-30 DIAGNOSIS — Z98891 History of uterine scar from previous surgery: Secondary | ICD-10-CM

## 2016-12-30 DIAGNOSIS — O0990 Supervision of high risk pregnancy, unspecified, unspecified trimester: Secondary | ICD-10-CM

## 2016-12-30 DIAGNOSIS — Z6741 Type O blood, Rh negative: Secondary | ICD-10-CM

## 2016-12-30 MED ORDER — AMOXICILLIN 500 MG PO CAPS: 500.0000 mg | ORAL_CAPSULE | Freq: Three times a day (TID) | ORAL | 0 refills | Status: AC

## 2016-12-30 NOTE — Progress Notes (Signed)
No vb. No lof. TDAP nv per pt. Rhogam last visit.  Tick exposure last week. Nausea/vomiting with exposure.  Amox for prophylaxis.

## 2017-01-03 ENCOUNTER — Encounter: Payer: Self-pay | Admitting: Obstetrics & Gynecology

## 2017-01-11 ENCOUNTER — Observation Stay
Admission: EM | Admit: 2017-01-11 | Discharge: 2017-01-12 | Disposition: A | Discharge status: Home / Self Care | Payer: 59 | Attending: Obstetrics and Gynecology | Admitting: Obstetrics and Gynecology

## 2017-01-11 ENCOUNTER — Encounter: Payer: Self-pay | Admitting: Emergency Medicine

## 2017-01-11 DIAGNOSIS — Z3A32 32 weeks gestation of pregnancy: Secondary | ICD-10-CM | POA: Insufficient documentation

## 2017-01-11 DIAGNOSIS — R10819 Abdominal tenderness, unspecified site: Secondary | ICD-10-CM | POA: Insufficient documentation

## 2017-01-11 DIAGNOSIS — O26893 Other specified pregnancy related conditions, third trimester: Secondary | ICD-10-CM
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
## 2017-01-12 DIAGNOSIS — O26893 Other specified pregnancy related conditions, third trimester: Secondary | ICD-10-CM | POA: Diagnosis not present
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
## 2017-01-13 ENCOUNTER — Ambulatory Visit (INDEPENDENT_AMBULATORY_CARE_PROVIDER_SITE_OTHER): Payer: 59 | Admitting: Obstetrics and Gynecology

## 2017-01-13 VITALS — BP 116/70 | Wt 190.0 lb

## 2017-01-13 DIAGNOSIS — Z3A32 32 weeks gestation of pregnancy: Secondary | ICD-10-CM

## 2017-01-13 DIAGNOSIS — B373 Candidiasis of vulva and vagina: Secondary | ICD-10-CM | POA: Diagnosis not present

## 2017-01-13 DIAGNOSIS — B3731 Acute candidiasis of vulva and vagina: Secondary | ICD-10-CM

## 2017-01-13 DIAGNOSIS — O0990 Supervision of high risk pregnancy, unspecified, unspecified trimester: Secondary | ICD-10-CM

## 2017-01-13 LAB — POCT WET PREP WITH KOH
Clue Cells Wet Prep HPF POC: NEGATIVE
KOH PREP POC: NEGATIVE
Trichomonas, UA: NEGATIVE

## 2017-01-13 MED ORDER — TERCONAZOLE 0.4 % VA CREA: 1.0000 | TOPICAL_CREAM | Freq: Every day | VAGINAL | 0 refills | Status: DC
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
## 2017-01-15 ENCOUNTER — Telehealth: Payer: Self-pay

## 2017-01-15 NOTE — Telephone Encounter (Signed)
Pt states she discussed w/CLG at 28 week visit about having c section if she hasn't gone into labor by 03/05/17. Her job is asking her for a note stating her proposed due date/first day out of work. Advisied patient I will create letter & leave copy at front desk for p/u per her request. She will also try to complete My Chart sign up for future communications.

## 2017-01-15 NOTE — Telephone Encounter (Signed)
Letter created/printed & left at front desk for p/u.

## 2017-01-15 NOTE — Telephone Encounter (Signed)
Pt would like to discuss maternity leave with someone. [phone].

## 2017-01-16 ENCOUNTER — Telehealth: Payer: Self-pay

## 2017-01-16 NOTE — Telephone Encounter (Signed)
Pt transferred to sara to make appt for pelvic pain

## 2017-01-17 ENCOUNTER — Ambulatory Visit (INDEPENDENT_AMBULATORY_CARE_PROVIDER_SITE_OTHER): Payer: 59 | Admitting: Advanced Practice Midwife

## 2017-01-17 VITALS — BP 100/70 | Wt 189.0 lb

## 2017-01-17 DIAGNOSIS — Z3A33 33 weeks gestation of pregnancy: Secondary | ICD-10-CM

## 2017-01-17 NOTE — Progress Notes (Signed)
Having pain and pressure and requesting off work today. Work note given. No regular contractions, no LOF, no VB.

## 2017-01-27 ENCOUNTER — Ambulatory Visit (INDEPENDENT_AMBULATORY_CARE_PROVIDER_SITE_OTHER): Payer: 59 | Admitting: Obstetrics and Gynecology

## 2017-01-27 VITALS — BP 106/68 | Wt 191.0 lb

## 2017-01-27 DIAGNOSIS — O0990 Supervision of high risk pregnancy, unspecified, unspecified trimester: Secondary | ICD-10-CM

## 2017-01-27 DIAGNOSIS — E669 Obesity, unspecified: Secondary | ICD-10-CM

## 2017-01-27 DIAGNOSIS — Z3A34 34 weeks gestation of pregnancy: Secondary | ICD-10-CM

## 2017-01-27 NOTE — Progress Notes (Signed)
Vaginal pressure

## 2017-02-06 ENCOUNTER — Telehealth: Payer: Self-pay

## 2017-02-06 NOTE — Telephone Encounter (Signed)
Pt calling c ctxs real bad, leg went numb last night, right side sharp pains, lower back pains really bad.  I called at 9:27 - female answered.  He wasn't with pt. Adv him to have pt call me and if ctxs 5-447min apart for an hour to go to hosp.   Called again at 10:21 - vm not set up.  Called at 10:45 - pt states ctxs are 5-2010min apart.  Adv if wants to wait until they are 5-317min apart before going to L&D ok or if not comfortable staying at home to go to L&D via ED.  Labor prec reviewed as well.

## 2017-02-12 ENCOUNTER — Other Ambulatory Visit: Payer: 59

## 2017-02-12 ENCOUNTER — Encounter: Payer: 59 | Admitting: Certified Nurse Midwife

## 2017-02-18 ENCOUNTER — Ambulatory Visit (INDEPENDENT_AMBULATORY_CARE_PROVIDER_SITE_OTHER): Payer: 59

## 2017-02-18 ENCOUNTER — Ambulatory Visit (INDEPENDENT_AMBULATORY_CARE_PROVIDER_SITE_OTHER): Payer: 59 | Admitting: Obstetrics and Gynecology

## 2017-02-18 VITALS — BP 106/58 | Wt 193.0 lb

## 2017-02-18 DIAGNOSIS — E66811 Obesity, class 1: Secondary | ICD-10-CM

## 2017-02-18 DIAGNOSIS — Z3685 Encounter for antenatal screening for Streptococcus B: Secondary | ICD-10-CM

## 2017-02-18 DIAGNOSIS — Z98891 History of uterine scar from previous surgery: Secondary | ICD-10-CM

## 2017-02-18 DIAGNOSIS — Z362 Encounter for other antenatal screening follow-up: Secondary | ICD-10-CM

## 2017-02-18 DIAGNOSIS — Z3A37 37 weeks gestation of pregnancy: Secondary | ICD-10-CM

## 2017-02-18 DIAGNOSIS — Z3A34 34 weeks gestation of pregnancy: Secondary | ICD-10-CM

## 2017-02-18 DIAGNOSIS — Z3483 Encounter for supervision of other normal pregnancy, third trimester: Secondary | ICD-10-CM

## 2017-02-18 DIAGNOSIS — O0990 Supervision of high risk pregnancy, unspecified, unspecified trimester: Secondary | ICD-10-CM

## 2017-02-18 DIAGNOSIS — E669 Obesity, unspecified: Secondary | ICD-10-CM

## 2017-02-18 DIAGNOSIS — Z3490 Encounter for supervision of normal pregnancy, unspecified, unspecified trimester: Secondary | ICD-10-CM | POA: Insufficient documentation

## 2017-02-18 NOTE — Progress Notes (Signed)
Cramping in calves/pain in legs and cervix

## 2017-02-18 NOTE — Progress Notes (Signed)
31 y.o. G2P1 at [redacted]w[redacted]d with Estimated Date of Delivery: 03/05/17 was seen today in office to discuss trial of labor after cesarean section (TOLAC) versus elective repeat cesarean delivery (ERCD). The following risks were discussed with the patient.  Risk of uterine rupture at term is 0.78 percent with TOLAC and 0.22 percent with ERCD. 1 in 10 uterine ruptures will result in neonatal death or neurological injury. The benefits of a trial of labor after cesarean (TOLAC) resulting in a vaginal birth after cesarean (VBAC) include the following: shorter length of hospital stay and postpartum recovery (in most cases); fewer complications, such as postpartum fever, wound or uterine infection, thromboembolism (blood clots in the leg or lung), need for blood transfusion and fewer neonatal breathing problems. The risks of an attempted VBAC or TOLAC include the following: Risk of failed trial of labor after cesarean (TOLAC) without a vaginal birth after cesarean (VBAC) resulting in repeat cesarean delivery (RCD) in about 20 to 40 percent of women who attempt VBAC.  Her individualized success rate using the MFMU VBAC risk calculator is 50.3%.   Risk of rupture of uterus resulting in an emergency cesarean delivery. The risk of uterine rupture may be related in part to the type of uterine incision made during the first cesarean delivery. A previous transverse uterine incision has the lowest risk of rupture (0.2 to 1.5 percent risk). Vertical or T-shaped uterine incisions have a higher risk of uterine rupture (4 to 9 percent risk)The risk of fetal death is very low with both VBAC and elective repeat cesarean delivery (ERCD), but the likelihood of fetal death is higher with VBAC than with ERCD. Maternal death is very rare with either type of delivery. The risks of an elective repeat cesarean delivery (ERCD) were reviewed with the patient including but not limited to: 10/998 risk of uterine rupture which could have serious  consequences, bleeding which may require transfusion; infection which may require antibiotics; injury to bowel, bladder or other surrounding organs (bowel, bladder, ureters); injury to the fetus; need for additional procedures including hysterectomy in the event of a life-threatening hemorrhage; thromboembolic phenomenon; abnormal placentation; incisional problems; death and other postoperative or anesthesia complications.    In addition we discussed that our collective office practice is to allow patient's who desire to attempt TOLAC to go into labor naturally.  There is some limited data that rupture rate may increase past [redacted] weeks gestation, but it is reasonable for women who are strongly committed to [facility] to continue pregnancy into the 41st week.  Medical indications necessetating early delivery may arise during the course of any pregnancy.  Given the contraindication on the use of prostaglandins for use in cervical ripening,  recommendation would be to proceed with repeat cesarean for delivery for patient's with unfavorable cervix (low Bishops score) who reach 41 weeks or who otherwise have a medical indication for early delivery.   These risks and benefits are summarized on the consent form, which was reviewed with the patient during the visit.  All her questions answered and she signed a consent indicating a preference for TOLAC/ERCD. A copy of the consent was given to the patient.  MFMU VBAC calculator success rate of 50.3% with 95% CI of 47.3% to 53.3%.  The patient was counseled regarding recommendation for elective repeat cesarean section (ERCS) given that she has failed to go into spontaneous labor by [redacted]w[redacted]d gestation and has an unfavorable cervix.  Previous studies examining have noted an increased risk or maternal and fetal morbidity  for patient attempting a trial of labor whose success rated is <70% compared to ERCS, while morbidity was similar for patient attempting a trial of labor vs ERCS  with success rates >70. ("Can a prediction model for vaginal birth after cesarean section also predict the probablity of  Morbidity related to a trial of labor?" American Jourcal of Obstetric and Gynecology 2009  January).  This fetus is measuring appropriately at 7lbs 2oz but the patient had failure to progress with 7lbs 5oz fetus.  GBS collected today as not previously obtained  Vena AustriaAndreas Shandy Vi, MD, Merlinda FrederickFACOG [facility]

## 2017-02-19 ENCOUNTER — Telehealth: Payer: Self-pay | Admitting: Obstetrics and Gynecology

## 2017-02-19 NOTE — Telephone Encounter (Signed)
Patient is aware of H&P on 03/10/17 @ 10:10am w/ Pre-admit Testing afterwards, and OR on 03/11/17.

## 2017-02-19 NOTE — Telephone Encounter (Signed)
-----   Message from Vena AustriaAndreas Staebler, MD sent at 02/18/2017  4:48 PM EDT ----- Regarding: surgery Surgery Date: 03/11/17  LOS: inpatient  Surgery Booking Request Patient Full Name: Loretta Jensen, Loretta Jensen MRN: [id number]  DOB: [birth year]  Surgeon: On call provider Requested Surgery Date and Time: 03/11/17 Primary Diagnosis and Code: History prior c-section Secondary Diagnosis and Code:  Surgical Procedure: Cesarean Section L&D Notification:yes Admission Status: surgery admit Length of Surgery: 1hr Special Case Needs:none H&P: (date) Phone Interview or Office Pre-Admit: yes Interpreter: no Language: no Medical Clearance: no Special Scheduling Instructions: none

## 2017-02-22 LAB — STREP GP B NAA: Strep Gp B NAA: NEGATIVE

## 2017-02-23 ENCOUNTER — Encounter: Payer: Self-pay | Admitting: Obstetrics and Gynecology

## 2017-02-25 ENCOUNTER — Telehealth: Payer: Self-pay | Admitting: Obstetrics & Gynecology

## 2017-02-25 NOTE — Telephone Encounter (Signed)
Patient is aware of H&P on Friday, 03/07/17 2 8am w/ Dr Tiburcio PeaHarris and PAT on Monday, 03/10/17 @ 11:15am. Patient is aware Dr Tiburcio PeaHarris will do her C/S on 03/11/17.

## 2017-02-25 NOTE — Telephone Encounter (Signed)
Patient's C/S is scheduled w/ Dr Bonney AidStaebler but he is not in the OR that day. Dr Tiburcio PeaHarris will be the surgeon. I need to reschedule the patient's H&P to Dr Tiburcio PeaHarris. Lmtrc.

## 2017-02-27 ENCOUNTER — Telehealth: Payer: Self-pay | Admitting: Obstetrics and Gynecology

## 2017-02-27 ENCOUNTER — Ambulatory Visit (INDEPENDENT_AMBULATORY_CARE_PROVIDER_SITE_OTHER): Payer: 59 | Admitting: Obstetrics and Gynecology

## 2017-02-27 VITALS — BP 102/66 | Wt 188.0 lb

## 2017-02-27 DIAGNOSIS — Z3A39 39 weeks gestation of pregnancy: Secondary | ICD-10-CM

## 2017-02-27 DIAGNOSIS — Z98891 History of uterine scar from previous surgery: Secondary | ICD-10-CM

## 2017-02-27 DIAGNOSIS — O0990 Supervision of high risk pregnancy, unspecified, unspecified trimester: Secondary | ICD-10-CM

## 2017-02-27 NOTE — Progress Notes (Signed)
VBAC consent form signed today faxed to L&D given AVS showing data calculated for her last visit

## 2017-02-27 NOTE — Telephone Encounter (Signed)
Please advise 

## 2017-02-27 NOTE — Telephone Encounter (Signed)
Pt is calling about being seen today and is having more bleeding then when she was here and would like to have Dr. Bonney AidStaebler to call me back please

## 2017-02-27 NOTE — Patient Instructions (Signed)
Risk of uterine rupture at term is 0.78 percent with TOLAC and 0.22 percent with ERCD. 1 in 10 uterine ruptures will result in neonatal death or neurological injury. The benefits of a trial of labor after cesarean (TOLAC) resulting in a vaginal birth after cesarean (VBAC) include the following: shorter length of hospital stay and postpartum recovery (in most cases); fewer complications, such as postpartum fever, wound or uterine infection, thromboembolism (blood clots in the leg or lung), need for blood transfusion and fewer neonatal breathing problems. The risks of an attempted VBAC or TOLAC include the following:  Risk of failed trial of labor after cesarean (TOLAC) without a vaginal birth after cesarean (VBAC) resulting in repeat cesarean delivery (RCD) in about 20 to 40 percent of women who attempt VBAC.  Her individualized success rate using the MFMU VBAC risk calculator is 50.3%.    Risk of rupture of uterus resulting in an emergency cesarean delivery. The risk of uterine rupture may be related in part to the type of uterine incision made during the first cesarean delivery. A previous transverse uterine incision has the lowest risk of rupture (0.2 to 1.5 percent risk). Vertical or T-shaped uterine incisions have a higher risk of uterine rupture (4 to 9 percent risk)The risk of fetal death is very low with both VBAC and elective repeat cesarean delivery (ERCD), but the likelihood of fetal death is higher with VBAC than with ERCD. Maternal death is very rare with either type of delivery. The risks of an elective repeat cesarean delivery (ERCD) were reviewed with the patient including but not limited to: 10/998 risk of uterine rupture which could have serious consequences, bleeding which may require transfusion; infection which may require antibiotics; injury to bowel, bladder or other surrounding organs (bowel, bladder, ureters); injury to the fetus; need for additional procedures including hysterectomy  in the event of a life-threatening hemorrhage; thromboembolic phenomenon; abnormal placentation; incisional problems; death and other postoperative or anesthesia complications.    In addition we discussed that our collective office practice is to allow patient's who desire to attempt TOLAC to go into labor naturally.  There is some limited data that rupture rate may increase past [redacted] weeks gestation, but it is reasonable for women who are strongly [facility] to continue pregnancy into the 41st week.  Medical indications necessetating early delivery may arise during the course of any pregnancy.  Given the contraindication on the use of prostaglandins for use in cervical ripening,  recommendation would be to proceed with repeat cesarean for delivery for patient's with unfavorable cervix (low Bishops score) who reach 41 weeks or who otherwise have a medical indication for early delivery.   These risks and benefits are summarized on the consent form, which was reviewed with the patient during the visit.  All her questions answered and she signed a consent indicating a preference for TOLAC/ERCD. A copy of the consent was given to the patient.  MFMU VBAC calculator success rate of 50.3% with 95% CI of 47.3% to 53.3%.  The patient was counseled regarding recommendation for elective repeat cesarean section (ERCS) given that she has failed to go into spontaneous labor by 170w3d gestation and has an unfavorable cervix.  Previous studies examining have noted an increased risk or maternal and fetal morbidity for patient attempting a trial of labor whose success rated is <70% compared to [facility], while morbidity was similar for patient attempting a trial of labor vs ERCS with success rates >70. ("Can a prediction model for vaginal  birth after cesarean section also predict the probablity of  Morbidity related to a trial of labor?" American Jourcal of Obstetric and Gynecology 2009  January).  This fetus is measuring  appropriately at 7lbs 2oz but the patient had failure to progress with 7lbs 5oz fetus.

## 2017-02-27 NOTE — Telephone Encounter (Signed)
Pt calling stating she has been bleeding since she got home today.  She thinks she saw a clot - not sure if mucus plug.  No ctx.  Please call.

## 2017-03-01 ENCOUNTER — Inpatient Hospital Stay
Admission: EM | Admit: 2017-03-01 | Discharge: 2017-03-05 | DRG: 765 | Disposition: A | Discharge status: Home / Self Care | Payer: 59 | Source: Ambulatory Visit | Attending: Obstetrics and Gynecology | Admitting: Obstetrics and Gynecology

## 2017-03-01 DIAGNOSIS — O34211 Maternal care for low transverse scar from previous cesarean delivery: Secondary | ICD-10-CM | POA: Diagnosis not present

## 2017-03-01 DIAGNOSIS — Z6791 Unspecified blood type, Rh negative: Secondary | ICD-10-CM | POA: Diagnosis not present

## 2017-03-01 DIAGNOSIS — O99214 Obesity complicating childbirth: Secondary | ICD-10-CM | POA: Diagnosis present

## 2017-03-01 DIAGNOSIS — D62 Acute posthemorrhagic anemia: Secondary | ICD-10-CM | POA: Diagnosis not present

## 2017-03-01 DIAGNOSIS — O4292 Full-term premature rupture of membranes, unspecified as to length of time between rupture and onset of labor: Secondary | ICD-10-CM | POA: Diagnosis present

## 2017-03-01 DIAGNOSIS — E669 Obesity, unspecified: Secondary | ICD-10-CM | POA: Diagnosis present

## 2017-03-01 DIAGNOSIS — Z3493 Encounter for supervision of normal pregnancy, unspecified, third trimester: Secondary | ICD-10-CM | POA: Diagnosis not present

## 2017-03-01 DIAGNOSIS — Z6830 Body mass index (BMI) 30.0-30.9, adult: Secondary | ICD-10-CM

## 2017-03-01 DIAGNOSIS — O4290 Premature rupture of membranes, unspecified as to length of time between rupture and onset of labor, unspecified weeks of gestation: Secondary | ICD-10-CM | POA: Diagnosis present

## 2017-03-01 DIAGNOSIS — Z3A39 39 weeks gestation of pregnancy: Secondary | ICD-10-CM

## 2017-03-01 DIAGNOSIS — O9081 Anemia of the puerperium: Secondary | ICD-10-CM | POA: Diagnosis not present

## 2017-03-01 DIAGNOSIS — Z98891 History of uterine scar from previous surgery: Secondary | ICD-10-CM

## 2017-03-01 DIAGNOSIS — Z6741 Type O blood, Rh negative: Secondary | ICD-10-CM | POA: Diagnosis present

## 2017-03-01 DIAGNOSIS — Z3490 Encounter for supervision of normal pregnancy, unspecified, unspecified trimester: Secondary | ICD-10-CM

## 2017-03-01 DIAGNOSIS — O26893 Other specified pregnancy related conditions, third trimester: Secondary | ICD-10-CM | POA: Diagnosis present

## 2017-03-01 DIAGNOSIS — O0990 Supervision of high risk pregnancy, unspecified, unspecified trimester: Secondary | ICD-10-CM

## 2017-03-01 LAB — CBC
HEMATOCRIT: 36.9 % (ref 35.0–47.0)
HEMOGLOBIN: 12.7 g/dL (ref 12.0–16.0)
MCH: 28.7 pg (ref 26.0–34.0)
MCHC: 34.4 g/dL (ref 32.0–36.0)
MCV: 83.4 fL (ref 80.0–100.0)
Platelets: 256 10*3/uL (ref 150–440)
RBC: 4.42 MIL/uL (ref 3.80–5.20)
RDW: 14.6 % — ABNORMAL HIGH (ref 11.5–14.5)
WBC: 11.3 10*3/uL — AB (ref 3.6–11.0)

## 2017-03-01 MED ORDER — OXYTOCIN BOLUS FROM INFUSION: 500.0000 mL | Freq: Once | INTRAVENOUS | Status: DC

## 2017-03-01 MED ORDER — AMMONIA AROMATIC IN INHA
RESPIRATORY_TRACT | Status: AC
Start: 2017-03-01 — End: 2017-03-02
  Filled 2017-03-01: qty 10

## 2017-03-01 MED ORDER — LACTATED RINGERS IV SOLN
INTRAVENOUS | Status: DC
  Administered 2017-03-01 – 2017-03-02 (×2): via INTRAVENOUS

## 2017-03-01 MED ORDER — ONDANSETRON HCL 4 MG/2ML IJ SOLN: 4.0000 mg | Freq: Four times a day (QID) | INTRAMUSCULAR | Status: DC | PRN

## 2017-03-01 MED ORDER — LACTATED RINGERS IV SOLN: 500.0000 mL | INTRAVENOUS | Status: DC | PRN

## 2017-03-01 MED ORDER — LIDOCAINE HCL (PF) 1 % IJ SOLN
30.0000 mL | INTRAMUSCULAR | Status: DC | PRN
  Filled 2017-03-01: qty 30

## 2017-03-01 MED ORDER — OXYTOCIN 10 UNIT/ML IJ SOLN
10.0000 [IU] | Freq: Once | INTRAMUSCULAR | Status: DC
  Filled 2017-03-01: qty 2

## 2017-03-01 MED ORDER — SOD CITRATE-CITRIC ACID 500-334 MG/5ML PO SOLN
30.0000 mL | ORAL | Status: DC | PRN
  Filled 2017-03-01: qty 15

## 2017-03-01 MED ORDER — TERBUTALINE SULFATE 1 MG/ML IJ SOLN: 0.2500 mg | Freq: Once | INTRAMUSCULAR | Status: DC | PRN

## 2017-03-01 MED ORDER — MISOPROSTOL 200 MCG PO TABS
ORAL_TABLET | ORAL | Status: AC
  Filled 2017-03-01: qty 4

## 2017-03-01 MED ORDER — OXYTOCIN 40 UNITS IN LACTATED RINGERS INFUSION - SIMPLE MED
1.0000 m[IU]/min | INTRAVENOUS | Status: DC
  Administered 2017-03-02: 1 m[IU]/min via INTRAVENOUS

## 2017-03-01 MED ORDER — OXYTOCIN 40 UNITS IN LACTATED RINGERS INFUSION - SIMPLE MED
2.5000 [IU]/h | INTRAVENOUS | Status: DC
  Filled 2017-03-01 (×2): qty 1000

## 2017-03-01 NOTE — Progress Notes (Signed)
Patient has decided for cervical ripening/induction of labor with pitocin.  Risks/benefits reviewed. Not to exceed dose of 5 units/hr until in active phase Will give ~6-12 hours to have made significant cervical change with ripening. If no significant change, will proceed with cesarean section.   Thomasene MohairStephen Jojo Pehl, MD 03/01/2017 11:42 PM

## 2017-03-01 NOTE — H&P (Signed)
OB History & Physical   History of Present Illness:  Chief Complaint: leaking fluid  HPI:  Loretta Jensen is a 31 y.o. G2P1 female at [redacted]w[redacted]d dated by LMP consistent with an 11 week ultrasound.  Her pregnancy has been complicated by a history of cesarean delivery for failure to dilate, obesity with BMI 30-35, rh negative status.    She denies contractions.   She reports leakage of fluid since about 730 pm this evening. The fluid was clear and continues to leak.   She denies vaginal bleeding.   She reports fetal movement.    Maternal Medical History:   Past Medical History:  Diagnosis Date  . Allergy   . Type O blood, Rh negative     Past Surgical History:  Procedure Laterality Date  . CESAREAN SECTION  2009   Allergies: No Known Allergies  Prior to Admission medications   Medication Sig Start Date End Date Taking? Authorizing Provider  Prenatal Vit-Fe Fumarate-FA (PRENATAL MULTIVITAMIN) TABS tablet Take 1 tablet by mouth daily at 12 noon.   Yes [provider]    OB History  Gravida Para Term Preterm AB Living  2 1       1   SAB TAB Ectopic Multiple Live Births          1    # Outcome Date GA Lbr Len/2nd Weight Sex Delivery Anes PTL Lv  2 Current           1 Para 09/18/08 [redacted]w[redacted]d  7 lb 5 oz (3.317 kg) F CS-LTranv   LIV     Complications: Failure to Progress in Second Stage      Prenatal care site: [facility]  Social History: She  reports that she has never smoked. She has never used smokeless tobacco. She reports that she drinks alcohol. She reports that she does not use drugs.  Family History: family history includes Breast cancer in her maternal grandmother; Breast cancer (age of onset: 2) in her mother; Cancer in her mother.   Review of Systems:  Review of Systems  Constitutional: Negative.   HENT: Negative.   Eyes: Negative.   Respiratory: Negative.   Cardiovascular: Negative.   Gastrointestinal: Negative.   Genitourinary:       See HPI   Musculoskeletal: Negative.   Skin: Negative.   Neurological: Negative.   Psychiatric/Behavioral: Negative.      Physical Exam:  Vital Signs: BP 125/72   Pulse 78   Temp 98.3 F (36.8 C) (Oral)   Resp 18   Ht 5\' 4"  (1.626 m)   Wt 188 lb (85.3 kg)   LMP 05/29/2016   BMI 32.27 kg/m  Physical Exam  Constitutional: She is oriented to person, place, and time and well-developed, well-nourished, and in no distress. No distress.  HENT:  Head: Normocephalic and atraumatic.  Eyes: Conjunctivae are normal. No scleral icterus.  Neck: Normal range of motion. Neck supple.  Cardiovascular: Normal rate, regular rhythm and normal heart sounds.  Exam reveals no gallop and no friction rub.   No murmur heard. Pulmonary/Chest: Effort normal and breath sounds normal. No respiratory distress. She has no wheezes. She has no rales. She exhibits no tenderness.  Abdominal: Soft. Bowel sounds are normal. She exhibits no distension. There is no tenderness. There is no rebound and no guarding.  Gravid, NT, EFW 8 pounds  Musculoskeletal: Normal range of motion. She exhibits no edema.  Neurological: She is alert and oriented to person, place, and time. No cranial nerve  deficit.  Skin: Skin is warm and dry. No erythema.  Psychiatric: Mood, affect and judgment normal.  Cervix: 1.5/60/-3 per RN Grossly ruptured   Pertinent Results:  Prenatal Labs: Blood type/Rh O negative  Antibody screen negative  Rubella Immune  Varicella Immune    RPR NR  HBsAg negative  HIV negative  GC negative  Chlamydia negative  Genetic screening neg  1 hour GTT Early 96, 28 wk - 128  3 hour GTT n/a  GBS negative on 02/19/17   Baseline FHR: 125 beats/min   Variability: moderate   Accelerations: present   Decelerations: absent Contractions: present frequency: 4-5 q 10 min Overall assessment: category 1  Assessment:  Loretta Jensen is a 31 y.o. G2P1 female at 6356w3d with PROM, history of cesarean delivery, rh negative  status, obesity (BMI 30-35).   Plan:  1. Admit to Labor & Delivery  2. CBC, T&S, NPO, IVF 3. GBS negative on 02/19/17 4. Fetwal well-being: reassuring 5. History of cesarean delivery: patient counseled extensively previously regarding chance of success with TOLAC.  However, her present circumstance has not been fully covered.  With her presenting with ruptured membranes without labor and without a favorable cervix, our options are 1) proceed with cesarean delivery and 2) perhaps attempt to help her a little with pitocin to get her cervix more favorable to labor.  She is contracting now, but not really feeling them at all.  She has made no cervical change since 4 days ago.  So, her options are those above. Discussed the increased risk for TOLAC with the use of pitocin, especially in the setting of her cervix being unfavorable and her not being in labor.  She will consider. She will be NPO for now and the OR and anesthesia is aware of this patient.    Thomasene MohairStephen Bibi Economos, MD 03/01/2017 10:22 PM

## 2017-03-01 NOTE — OB Triage Note (Signed)
Patient c/o of possible ROM at 1930. Patient was sitting at home when she felt the gush of fluid. Patient reports some bloody show afterwards. Denies pain and denies contractions. Reports positive fetal movement.

## 2017-03-02 ENCOUNTER — Inpatient Hospital Stay: Payer: 59 | Admitting: Anesthesiology

## 2017-03-02 ENCOUNTER — Encounter: Payer: Self-pay | Admitting: *Deleted

## 2017-03-02 ENCOUNTER — Encounter
Admission: EM | Disposition: A | Discharge status: Home / Self Care | Payer: Self-pay | Source: Ambulatory Visit | Attending: Obstetrics and Gynecology

## 2017-03-02 DIAGNOSIS — Z3A39 39 weeks gestation of pregnancy: Secondary | ICD-10-CM

## 2017-03-02 DIAGNOSIS — O34211 Maternal care for low transverse scar from previous cesarean delivery: Secondary | ICD-10-CM

## 2017-03-02 DIAGNOSIS — Z98891 History of uterine scar from previous surgery: Secondary | ICD-10-CM

## 2017-03-02 HISTORY — DX: History of uterine scar from previous surgery: Z98.891

## 2017-03-02 LAB — TYPE AND SCREEN
ABO/RH(D): O NEG
ANTIBODY SCREEN: NEGATIVE

## 2017-03-02 SURGERY — Surgical Case
Anesthesia: Spinal | Wound class: Clean Contaminated

## 2017-03-02 MED ORDER — SOD CITRATE-CITRIC ACID 500-334 MG/5ML PO SOLN
30.0000 mL | ORAL | Status: AC
  Administered 2017-03-02: 30 mL via ORAL

## 2017-03-02 MED ORDER — FENTANYL CITRATE (PF) 100 MCG/2ML IJ SOLN
INTRAMUSCULAR | Status: DC | PRN
Start: 2017-03-02 — End: 2017-03-02
  Administered 2017-03-02: 15 ug via INTRATHECAL

## 2017-03-02 MED ORDER — MEPERIDINE HCL 25 MG/ML IJ SOLN: 6.2500 mg | INTRAMUSCULAR | Status: DC | PRN

## 2017-03-02 MED ORDER — DIPHENHYDRAMINE HCL 25 MG PO CAPS
25.0000 mg | ORAL_CAPSULE | Freq: Four times a day (QID) | ORAL | Status: DC | PRN
Start: 2017-03-02 — End: 2017-03-05

## 2017-03-02 MED ORDER — BUPIVACAINE HCL (PF) 0.5 % IJ SOLN
5.0000 mL | Freq: Once | INTRAMUSCULAR | Status: DC
  Filled 2017-03-02: qty 30

## 2017-03-02 MED ORDER — HYDROMORPHONE HCL 1 MG/ML IJ SOLN
0.5000 mg | INTRAMUSCULAR | Status: DC | PRN
  Administered 2017-03-02 (×2): 0.5 mg via INTRAVENOUS
  Filled 2017-03-02 (×2): qty 0.5

## 2017-03-02 MED ORDER — SCOPOLAMINE 1 MG/3DAYS TD PT72: 1.0000 | MEDICATED_PATCH | Freq: Once | TRANSDERMAL | Status: DC

## 2017-03-02 MED ORDER — BUPIVACAINE HCL (PF) 0.5 % IJ SOLN: 5.0000 mL | Freq: Once | INTRAMUSCULAR | Status: DC

## 2017-03-02 MED ORDER — EPHEDRINE SULFATE 50 MG/ML IJ SOLN
INTRAMUSCULAR | Status: DC | PRN
  Administered 2017-03-02 (×5): 5 mg via INTRAVENOUS

## 2017-03-02 MED ORDER — SODIUM CHLORIDE 0.9% FLUSH: 3.0000 mL | INTRAVENOUS | Status: DC | PRN

## 2017-03-02 MED ORDER — PRENATAL MULTIVITAMIN CH
1.0000 | ORAL_TABLET | Freq: Every day | ORAL | Status: DC
  Administered 2017-03-03 – 2017-03-04 (×2): 1 via ORAL
  Filled 2017-03-02 (×3): qty 1

## 2017-03-02 MED ORDER — MORPHINE SULFATE (PF) 0.5 MG/ML IJ SOLN
INTRAMUSCULAR | Status: DC | PRN
  Administered 2017-03-02: .1 mg via INTRATHECAL

## 2017-03-02 MED ORDER — ONDANSETRON HCL 4 MG/2ML IJ SOLN
INTRAMUSCULAR | Status: DC | PRN
  Administered 2017-03-02: 4 mg via INTRAVENOUS

## 2017-03-02 MED ORDER — NALOXONE HCL 2 MG/2ML IJ SOSY
1.0000 ug/kg/h | PREFILLED_SYRINGE | INTRAVENOUS | Status: DC | PRN
  Filled 2017-03-02: qty 2

## 2017-03-02 MED ORDER — ACETAMINOPHEN 325 MG PO TABS
650.0000 mg | ORAL_TABLET | Freq: Four times a day (QID) | ORAL | Status: AC
  Administered 2017-03-02 – 2017-03-03 (×4): 650 mg via ORAL
  Filled 2017-03-02 (×4): qty 2

## 2017-03-02 MED ORDER — BUPIVACAINE 0.25 % ON-Q PUMP DUAL CATH 400 ML
400.0000 mL | INJECTION | Status: DC
  Filled 2017-03-02: qty 400

## 2017-03-02 MED ORDER — MORPHINE SULFATE (PF) 0.5 MG/ML IJ SOLN
INTRAMUSCULAR | Status: AC
  Filled 2017-03-02: qty 10

## 2017-03-02 MED ORDER — SIMETHICONE 80 MG PO CHEW
80.0000 mg | CHEWABLE_TABLET | Freq: Three times a day (TID) | ORAL | Status: DC
  Administered 2017-03-02 – 2017-03-05 (×7): 80 mg via ORAL
  Filled 2017-03-02 (×7): qty 1

## 2017-03-02 MED ORDER — NALOXONE HCL 0.4 MG/ML IJ SOLN: 0.4000 mg | INTRAMUSCULAR | Status: DC | PRN

## 2017-03-02 MED ORDER — DIBUCAINE 1 % RE OINT: 1.0000 "application " | TOPICAL_OINTMENT | RECTAL | Status: DC | PRN

## 2017-03-02 MED ORDER — OXYCODONE HCL 5 MG PO TABS: 10.0000 mg | ORAL_TABLET | ORAL | Status: DC | PRN

## 2017-03-02 MED ORDER — NALBUPHINE HCL 10 MG/ML IJ SOLN: 5.0000 mg | INTRAMUSCULAR | Status: DC | PRN

## 2017-03-02 MED ORDER — SENNOSIDES-DOCUSATE SODIUM 8.6-50 MG PO TABS
2.0000 | ORAL_TABLET | ORAL | Status: DC
  Administered 2017-03-03 – 2017-03-05 (×3): 2 via ORAL
  Filled 2017-03-02 (×3): qty 2

## 2017-03-02 MED ORDER — OXYCODONE-ACETAMINOPHEN 5-325 MG PO TABS
1.0000 | ORAL_TABLET | ORAL | Status: DC | PRN
  Administered 2017-03-03 – 2017-03-05 (×6): 1 via ORAL
  Filled 2017-03-02 (×6): qty 1

## 2017-03-02 MED ORDER — OXYTOCIN 40 UNITS IN LACTATED RINGERS INFUSION - SIMPLE MED
INTRAVENOUS | Status: DC | PRN
  Administered 2017-03-02: 800 mL via INTRAVENOUS

## 2017-03-02 MED ORDER — DIPHENHYDRAMINE HCL 50 MG/ML IJ SOLN: 12.5000 mg | INTRAMUSCULAR | Status: DC | PRN

## 2017-03-02 MED ORDER — FENTANYL CITRATE (PF) 100 MCG/2ML IJ SOLN
INTRAMUSCULAR | Status: AC
  Filled 2017-03-02: qty 2

## 2017-03-02 MED ORDER — NALBUPHINE HCL 10 MG/ML IJ SOLN: 5.0000 mg | Freq: Once | INTRAMUSCULAR | Status: DC | PRN

## 2017-03-02 MED ORDER — BUPIVACAINE 0.25 % ON-Q PUMP DUAL CATH 400 ML: 400.0000 mL | INJECTION | Status: DC

## 2017-03-02 MED ORDER — ONDANSETRON HCL 4 MG/2ML IJ SOLN
INTRAMUSCULAR | Status: AC
  Filled 2017-03-02: qty 2

## 2017-03-02 MED ORDER — COCONUT OIL OIL
1.0000 "application " | TOPICAL_OIL | Status: DC | PRN
  Administered 2017-03-03: 1 via TOPICAL
  Filled 2017-03-02: qty 120

## 2017-03-02 MED ORDER — KETOROLAC TROMETHAMINE 30 MG/ML IJ SOLN
30.0000 mg | Freq: Four times a day (QID) | INTRAMUSCULAR | Status: AC
  Administered 2017-03-02 – 2017-03-03 (×4): 30 mg via INTRAVENOUS
  Filled 2017-03-02 (×4): qty 1

## 2017-03-02 MED ORDER — DIPHENHYDRAMINE HCL 25 MG PO CAPS: 25.0000 mg | ORAL_CAPSULE | ORAL | Status: DC | PRN

## 2017-03-02 MED ORDER — ONDANSETRON HCL 4 MG/2ML IJ SOLN: 4.0000 mg | Freq: Three times a day (TID) | INTRAMUSCULAR | Status: DC | PRN

## 2017-03-02 MED ORDER — CEFAZOLIN SODIUM-DEXTROSE 2-4 GM/100ML-% IV SOLN
2.0000 g | INTRAVENOUS | Status: AC
  Administered 2017-03-02: 2 g via INTRAVENOUS
  Filled 2017-03-02: qty 100

## 2017-03-02 MED ORDER — IBUPROFEN 600 MG PO TABS
600.0000 mg | ORAL_TABLET | Freq: Four times a day (QID) | ORAL | Status: DC
  Administered 2017-03-03 – 2017-03-05 (×7): 600 mg via ORAL
  Filled 2017-03-02 (×8): qty 1

## 2017-03-02 MED ORDER — PHENYLEPHRINE HCL 10 MG/ML IJ SOLN
INTRAMUSCULAR | Status: DC | PRN
Start: 2017-03-02 — End: 2017-03-02
  Administered 2017-03-02 (×9): 100 ug via INTRAVENOUS

## 2017-03-02 MED ORDER — BUPIVACAINE HCL 0.25 % IJ SOLN
INTRAMUSCULAR | Status: DC | PRN
  Administered 2017-03-02: 10 mL

## 2017-03-02 MED ORDER — LACTATED RINGERS IV SOLN
INTRAVENOUS | Status: DC
  Administered 2017-03-02: 12:00:00 via INTRAVENOUS

## 2017-03-02 MED ORDER — WITCH HAZEL-GLYCERIN EX PADS: 1.0000 "application " | MEDICATED_PAD | CUTANEOUS | Status: DC | PRN

## 2017-03-02 MED ORDER — BUPIVACAINE IN DEXTROSE 0.75-8.25 % IT SOLN
INTRATHECAL | Status: DC | PRN
  Administered 2017-03-02: 1.8 mL via INTRATHECAL

## 2017-03-02 MED ORDER — OXYTOCIN 40 UNITS IN LACTATED RINGERS INFUSION - SIMPLE MED
2.5000 [IU]/h | INTRAVENOUS | Status: AC
  Administered 2017-03-02: 2.5 [IU]/h via INTRAVENOUS
  Filled 2017-03-02: qty 1000

## 2017-03-02 MED ORDER — MENTHOL 3 MG MT LOZG
1.0000 | LOZENGE | OROMUCOSAL | Status: DC | PRN
  Filled 2017-03-02: qty 9

## 2017-03-02 MED ORDER — OXYCODONE-ACETAMINOPHEN 5-325 MG PO TABS
2.0000 | ORAL_TABLET | ORAL | Status: DC | PRN
  Administered 2017-03-04 (×3): 2 via ORAL
  Filled 2017-03-02 (×3): qty 2

## 2017-03-02 MED ORDER — OXYCODONE HCL 5 MG PO TABS: 5.0000 mg | ORAL_TABLET | ORAL | Status: DC | PRN

## 2017-03-02 MED ORDER — KETOROLAC TROMETHAMINE 30 MG/ML IJ SOLN: 30.0000 mg | Freq: Four times a day (QID) | INTRAMUSCULAR | Status: AC

## 2017-03-02 MED ORDER — FERROUS SULFATE 325 (65 FE) MG PO TABS
325.0000 mg | ORAL_TABLET | Freq: Two times a day (BID) | ORAL | Status: DC
  Administered 2017-03-02 – 2017-03-05 (×6): 325 mg via ORAL
  Filled 2017-03-02 (×6): qty 1

## 2017-03-02 SURGICAL SUPPLY — 28 items
CANISTER SUCT 3000ML PPV (MISCELLANEOUS) ×3 IMPLANT
CATH KIT ON-Q SILVERSOAK 5IN (CATHETERS) ×6 IMPLANT
CHLORAPREP W/TINT 26ML (MISCELLANEOUS) ×6 IMPLANT
DERMABOND ADVANCED (GAUZE/BANDAGES/DRESSINGS) ×2
DERMABOND ADVANCED .7 DNX12 (GAUZE/BANDAGES/DRESSINGS) ×1 IMPLANT
DRSG OPSITE POSTOP 4X10 (GAUZE/BANDAGES/DRESSINGS) ×3 IMPLANT
ELECT CAUTERY BLADE 6.4 (BLADE) ×3 IMPLANT
ELECT REM PT RETURN 9FT ADLT (ELECTROSURGICAL) ×3
ELECTRODE REM PT RTRN 9FT ADLT (ELECTROSURGICAL) ×1 IMPLANT
GLOVE SKINSENSE NS SZ8.0 LF (GLOVE) ×2
GLOVE SKINSENSE STRL SZ8.0 LF (GLOVE) ×1 IMPLANT
GOWN STRL REUS W/ TWL LRG LVL3 (GOWN DISPOSABLE) ×3 IMPLANT
GOWN STRL REUS W/ TWL XL LVL3 (GOWN DISPOSABLE) ×2 IMPLANT
GOWN STRL REUS W/TWL LRG LVL3 (GOWN DISPOSABLE) ×6
GOWN STRL REUS W/TWL XL LVL3 (GOWN DISPOSABLE) ×4
HANDLE YANKAUER SUCT BULB TIP (MISCELLANEOUS) ×3 IMPLANT
NS IRRIG 1000ML POUR BTL (IV SOLUTION) ×3 IMPLANT
PACK C SECTION AR (MISCELLANEOUS) ×3 IMPLANT
PAD OB MATERNITY 4.3X12.25 (PERSONAL CARE ITEMS) ×3 IMPLANT
PAD PREP 24X41 OB/GYN DISP (PERSONAL CARE ITEMS) ×3 IMPLANT
SPONGE LAP 18X18 5 PK (GAUZE/BANDAGES/DRESSINGS) ×3 IMPLANT
SUT MAXON ABS #0 GS21 30IN (SUTURE) IMPLANT
SUT MNCRL AB 4-0 PS2 18 (SUTURE) ×3 IMPLANT
SUT VIC AB 1 CT1 36 (SUTURE) ×6 IMPLANT
SUT VIC AB 2-0 CT1 36 (SUTURE) ×3 IMPLANT
SUT VIC AB 3-0 SH 27 (SUTURE) ×2
SUT VIC AB 3-0 SH 27X BRD (SUTURE) ×1 IMPLANT
SUT VIC AB 4-0 FS2 27 (SUTURE) ×3 IMPLANT

## 2017-03-02 NOTE — Progress Notes (Signed)
On-Q pump tubed to LDR at 10:54.

## 2017-03-02 NOTE — OR Nursing (Signed)
Sex: female Born [phone] Weight: 7Lb   3170 grams Apgar: 8   9

## 2017-03-02 NOTE — Op Note (Signed)
Cesarean Section Operative Note    Sabel Medero   03/02/2017   Pre-operative Diagnosis:  1) intrauterine pregnancy at 4050w4d  2) history of cesarean section, failed TOLAC 3) fetal intolerance of labor  Post-operative Diagnosis:  1) intrauterine pregnancy at 1550w4d  2) history of cesarean section, failed TOLAC 3) fetal intolerance of labor  Procedure: repeat low transverse cesarean section via pfannenstiel incision with double layer uterine closure  Surgeon: Surgeon(s) and Role:    Conard Novak* Damon Hargrove D, MD - Primary   Anesthesia: spinal   Findings:  1) normal appearing gravid uterus, fallopian tubes, and ovaries 2) viable female infant with APGARs of 8 and 9 with weight of 3,170 grams   Estimated Blood Loss: 800 mL  Total IV Fluids: 1,800 ml crystalloid  Urine Output: 250 mL clear urine at end of procedure  Specimens: none  Complications: no complications  Disposition: PACU - hemodynamically stable.   Maternal Condition: stable   Baby condition / location:  Couplet care / Skin to Skin  Procedure Details:  The patient was seen in the Holding Room. The risks, benefits, complications, treatment options, and expected outcomes were discussed with the patient. The patient concurred with the proposed plan, giving informed consent. identified as Advertising account plannerTurquoise Hohler and the procedure verified as C-Section Delivery. A Time Out was held and the above information confirmed.   After induction of anesthesia, the patient was draped and prepped in the usual sterile manner. A Pfannenstiel incision was made and carried down through the subcutaneous tissue to the fascia. Fascial incision was made and extended transversely. The fascia was separated from the underlying rectus tissue superiorly and inferiorly. The peritoneum was identified and entered. Peritoneal incision was extended longitudinally. The bladder flap was sharply freed from the lower uterine segment. A low transverse uterine  incision was made and the hysterotomy was extended with cranial-caudal tension. Delivered from cephalic presentation was a 3,170 gram Living newborn infant(s) or Female with Apgar scores of 8 at one minute and 9 at five minutes. Cord ph was not sent the umbilical cord was clamped and cut cord blood was obtained for evaluation. The placenta was removed Intact and appeared normal. The uterine outline, tubes and ovaries appeared normal. The uterine incision was closed with running locked sutures of 0 Vicryl.  A second layer of the same suture was thrown in an imbricating fashion.  Hemostasis was assured.  The uterus was returned to the abdomen and the paracolic gutters were cleared of all clots and debris.  The rectus muscles were inspected and found to be hemostatic.  The On-Q catheter pumps were inserted in accordance with the manufacturer's recommendations.  The catheters were inserted approximately 4cm cephelad to the incision line, approximately 1cm apart, straddling the midline.  They were inserted to a depth of the 4th mark. They were positioned superficial to the rectus abdominus muscles and deep to the rectus fascia.    The fascia was then reapproximated with running sutures of 1-0 PDS, looped. The subcutaneous tissue was reapproximated with 3 interrupted 3-0 vicryl stitches to reinforce the skin.  The subcuticular closure was performed using 4-0 monocryl. The skin closure was reinforced using surgical skin glue.  The On-Q catheters were bolused with 5 mL of 0.5% marcaine plain for a total of 10 mL.  The catheters were affixed to the skin with surgical skin glue, steri-strips, and tegaderm.    Instrument, sponge, and needle counts were correct prior the abdominal closure and were correct at the conclusion  of the case.  The patient received Ancef 2 gram IV prior to skin incision (within 30 minutes). For VTE prophylaxis she was wearing SCDs throughout the case.  Signed: Conard Novak,  MD 03/02/2017 9:12 AM

## 2017-03-02 NOTE — Anesthesia Procedure Notes (Signed)
Spinal  Patient location during procedure: OR Start time: 03/02/2017 7:54 AM End time: 03/02/2017 7:56 AM Staffing Anesthesiologist: Martha Clan Resident/CRNA: Demetrius Charity Performed: resident/CRNA  Preanesthetic Checklist Completed: patient identified, site marked, surgical consent, pre-op evaluation, timeout performed, IV checked, risks and benefits discussed and monitors and equipment checked Spinal Block Patient position: sitting Prep: ChloraPrep Patient monitoring: heart rate, continuous pulse ox, blood pressure and cardiac monitor Approach: midline Location: L3-4 Injection technique: single-shot Needle Needle type: Whitacre and Introducer  Needle gauge: 25 G Needle length: 9 cm Assessment Sensory level: T10 Additional Notes Negative paresthesia. Negative blood return. Positive free-flowing CSF. Expiration date of kit checked and confirmed. Patient tolerated procedure well, without complications.

## 2017-03-02 NOTE — Anesthesia Preprocedure Evaluation (Signed)
Anesthesia Evaluation  Patient identified by MRN, date of birth, ID band Patient awake    Reviewed: Allergy & Precautions, NPO status , Patient's Chart, lab work & pertinent test results  History of Anesthesia Complications Negative for: history of anesthetic complications  Airway Mallampati: III  TM Distance: >3 FB Neck ROM: Full    Dental no notable dental hx.    Pulmonary neg pulmonary ROS, neg sleep apnea, neg COPD,    breath sounds clear to auscultation- rhonchi (-) wheezing      Cardiovascular Exercise Tolerance: Good (-) hypertension(-) CAD and (-) Past MI  Rhythm:Regular Rate:Normal - Systolic murmurs and - Diastolic murmurs    Neuro/Psych negative neurological ROS  negative psych ROS   GI/Hepatic negative GI ROS, Neg liver ROS,   Endo/Other  negative endocrine ROSneg diabetes  Renal/GU negative Renal ROS     Musculoskeletal negative musculoskeletal ROS (+)   Abdominal (+) + obese, Gravid abdomen  Peds  Hematology negative hematology ROS (+)   Anesthesia Other Findings Hx of prior CD for failure to progress  Reproductive/Obstetrics (+) Pregnancy                             Anesthesia Physical Anesthesia Plan  ASA: II  Anesthesia Plan: Spinal   Post-op Pain Management:    Induction:   PONV Risk Score and Plan: 1 and Ondansetron  Airway Management Planned: Natural Airway  Additional Equipment:   Intra-op Plan:   Post-operative Plan:   Informed Consent: I have reviewed the patients History and Physical, chart, labs and discussed the procedure including the risks, benefits and alternatives for the proposed anesthesia with the patient or authorized representative who has indicated his/her understanding and acceptance.   Dental advisory given  Plan Discussed with: Anesthesiologist and CRNA  Anesthesia Plan Comments:         Lab Results  Component Value Date   WBC 11.3 (H) 03/01/2017   HGB 12.7 03/01/2017   HCT 36.9 03/01/2017   MCV 83.4 03/01/2017   PLT 256 03/01/2017    Anesthesia Quick Evaluation

## 2017-03-02 NOTE — Discharge Summary (Signed)
OB Discharge Summary     Patient Name: Loretta Jensen DOB: 1985-12-02 MRN: [id number]  Date of admission: 03/01/2017 Delivering MD: Thomasene MohairStephen Jackson, MD  Date of Delivery: 03/02/2017  Date of discharge: 03/05/2017  Admitting diagnosis:  1) intrauterine pregnancy at 5081w3d 2) Premature rupture of membranes 3) history of cesarean section  Intrauterine pregnancy: 6889w4d      Secondary diagnosis: TOLAC     Discharge diagnosis: Term Pregnancy Delivered and failed VBAC                                                                                                Post partum procedures:None  Augmentation: Pitocin  Complications: None  Hospital course:  Patient admitted with PROM. Cervical ripening/labor induction began. Fetus did not tolerate contractions well with low-dose pitocin.  Patient taken to operating room for repeat cesarean delivery, which occurred without incident.  Postoperative/postpartum course routine.  Ambulates well, regualr diet, + flatus, min bleeding, min pain.  Physical exam  Vitals:   03/04/17 0909 03/04/17 1629 03/04/17 1905 03/05/17 0805  BP: 121/71  111/68 105/64  Pulse: 84  62 64  Resp: 18  18 18   Temp: 98.1 F (36.7 C) 98 F (36.7 C) 97.7 F (36.5 C) 98.2 F (36.8 C)  TempSrc: Oral Oral Oral Oral  SpO2: 100%  98% 100%  Weight:      Height:       General: alert, cooperative and no distress Lochia: appropriate Uterine Fundus: firm Incision: Healing well with no significant drainage DVT Evaluation: No evidence of DVT seen on physical exam. Negative Homan's sign.  Labs: Lab Results  Component Value Date   WBC 11.3 (H) 03/03/2017   HGB 10.5 (L) 03/03/2017   HCT 31.0 (L) 03/03/2017   MCV 84.1 03/03/2017   PLT 200 03/03/2017    Discharge instruction: per After Visit Summary.  Medications:  Allergies as of 03/05/2017   No Known Allergies     Medication List    TAKE these medications   norethindrone 0.35 MG tablet Commonly known as:   MICRONOR,CAMILA,ERRIN Take 1 tablet (0.35 mg total) by mouth daily. Start in 2 weeks.   oxyCODONE-acetaminophen 5-325 MG tablet Commonly known as:  PERCOCET/ROXICET Take 1 tablet by mouth every 4 (four) hours as needed for moderate pain (pain score 4-7/10).   prenatal multivitamin Tabs tablet Take 1 tablet by mouth daily at 12 noon.       Diet: routine diet  Activity: Advance as tolerated. Pelvic rest for 6 weeks.   Outpatient follow up: Follow-up Information    Conard NovakJackson, Stephen D, MD Follow up in 1 week(s).   Specialty:  Obstetrics and Gynecology Why:  postop incision check and depression screen Contact information: [address] [place] [address] [phone]             Postpartum contraception: Progesterone only pills Rhogam Given postpartum: no Rubella vaccine given postpartum: no Varicella vaccine given postpartum: no TDaP given antepartum or postpartum: AP  Newborn Data: Live born female  Birth Weight: 6 lb 15.8 oz (3170 g) APGAR: 8, 9   Baby Feeding: Breast  Disposition:home with mother  SIGNED: Annamarie Major, MD, [facility], [facility] 03/05/2017  9:44 AM

## 2017-03-02 NOTE — Anesthesia Procedure Notes (Signed)
Procedure Name: MAC Date/Time: 03/02/2017 8:15 AM Performed by: Demetrius Charity Pre-anesthesia Checklist: Patient identified, Emergency Drugs available, Suction available, Patient being monitored and Timeout performed Oxygen Delivery Method: Nasal cannula

## 2017-03-02 NOTE — Anesthesia Post-op Follow-up Note (Cosign Needed)
Anesthesia QCDR form completed.        

## 2017-03-02 NOTE — Plan of Care (Signed)
Problem: Life Cycle: Goal: Risk for postpartum hemorrhage will decrease Outcome: Progressing Lochial flow is small and Fundus is U/-1  Problem: Nutritional: Goal: Dietary intake will improve Outcome: Completed/Met Date Met: 03/02/17 Tolerating Regular Diet  Problem: Pain Management: Goal: General experience of comfort will improve and pain level will decrease Outcome: Progressing Has On Q Pump and scheduled tylenol and Tordol  Problem: Urinary Elimination: Goal: Ability to reestablish a normal urinary elimination pattern will improve Outcome: Not Progressing Has indwelling Catheter

## 2017-03-02 NOTE — Transfer of Care (Signed)
Immediate Anesthesia Transfer of Care Note  Patient: Loretta Jensen  Procedure(s) Performed: Procedure(s): CESAREAN SECTION (N/A)  Patient Location: PACU  Anesthesia Type:Spinal  Level of Consciousness: awake, alert  and oriented  Airway & Oxygen Therapy: Patient Spontanous Breathing  Post-op Assessment: Report given to RN and Post -op Vital signs reviewed and stable  Post vital signs: Reviewed and stable  Last Vitals:  Vitals:   03/02/17 0531 03/02/17 0632  BP:  126/71  Pulse:  79  Resp:    Temp: 37.2 C     Last Pain:  Vitals:   03/02/17 0531  TempSrc: Axillary  PainSc:       Patients Stated Pain Goal: 0 (03/02/17 0128)  Complications: No apparent anesthesia complications

## 2017-03-02 NOTE — Progress Notes (Addendum)
Patient ID: Loretta Jensen, female   DOB: 26-Nov-1985, 31 y.o.   MRN: [id number]  Labor Check  Subj:  Complaints: painful ctx   Obj:  BP 126/71   Pulse 79   Temp 99 F (37.2 C) (Axillary)   Resp 18   Ht 5\' 4"  (1.626 m)   Wt 188 lb (85.3 kg)   LMP 05/29/2016   BMI 32.27 kg/m  Dose (milli-units/min) Oxytocin: 0 milli-units/min  Cervix: Dilation: 3 / Effacement (%): 80 / Station: -2  Baseline FHR: 135 beats/min   Variability: moderate   Accelerations: absent   Decelerations: present (difficult to characterize due to timing of ctx, late decelerations vs early) Contractions: present frequency: 3-4 q 10 min  Overall assessment: cat 2  A/P: 31 y.o. G2P1 female at 2162w4d with PROM, failed TOLAC. 1.  Labor: stopped pitocin due to decelerations, likely late given station of fetus and overall characteristics of decels.  Have discussed the new information with the patient. She wishes to proceed with cesarean delivery at this point.  I personally reviewed the consent and the patient agrees. Given the fetal tracing and the indication with PROM, will call urgent.  2.  FWB: guarded, Overall assessment: category 2.  Intra-uterine fetal resuscitation efforts underway.   3.  GBS negative  4.  Pain: prn 5.  Recheck: prn   Thomasene MohairStephen Malayasia Mirkin, MD 03/02/2017 7:24 AM

## 2017-03-03 ENCOUNTER — Encounter: Payer: Self-pay | Admitting: Obstetrics and Gynecology

## 2017-03-03 LAB — CBC
HCT: 31 % — ABNORMAL LOW (ref 35.0–47.0)
Hemoglobin: 10.5 g/dL — ABNORMAL LOW (ref 12.0–16.0)
MCH: 28.4 pg (ref 26.0–34.0)
MCHC: 33.7 g/dL (ref 32.0–36.0)
MCV: 84.1 fL (ref 80.0–100.0)
PLATELETS: 200 10*3/uL (ref 150–440)
RBC: 3.69 MIL/uL — ABNORMAL LOW (ref 3.80–5.20)
RDW: 15 % — AB (ref 11.5–14.5)
WBC: 11.3 10*3/uL — AB (ref 3.6–11.0)

## 2017-03-03 LAB — RPR: RPR: NONREACTIVE

## 2017-03-03 NOTE — Anesthesia Postprocedure Evaluation (Signed)
Anesthesia Post Note  Patient: Loretta Jensen  Procedure(s) Performed: Procedure(s) (LRB): CESAREAN SECTION (N/A)  Patient location during evaluation: Mother Baby Anesthesia Type: Spinal Level of consciousness: awake, awake and alert and oriented Pain management: pain level controlled Vital Signs Assessment: post-procedure vital signs reviewed and stable Respiratory status: spontaneous breathing, nonlabored ventilation and respiratory function stable Cardiovascular status: blood pressure returned to baseline and stable Postop Assessment: no headache and no backache Anesthetic complications: no     Last Vitals:  Vitals:   03/03/17 0001 03/03/17 0404  BP: (!) 94/57 (!) 95/53  Pulse: 85 72  Resp: 16 16  Temp: 36.9 C 36.7 C    Last Pain:  Vitals:   03/03/17 0624  TempSrc:   PainSc: 0-No pain                 [name]

## 2017-03-03 NOTE — Progress Notes (Signed)
  Subjective:  No concerns, pain well controlled, minimal lochia, no fevers, no chills.  Tolerating po  Objective:  Blood pressure (!) 104/52, pulse 77, temperature 98.7 F (37.1 C), temperature source Oral, resp. rate 18, height 5\' 4"  (1.626 m), weight 188 lb (85.3 kg), last menstrual period 05/29/2016, SpO2 99 %, unknown if currently breastfeeding.  General: NAD Pulmonary: no increased work of breathing Abdomen: non-distended, non-tender, fundus firm at level of umbilicus Incision: D/C/I Extremities: no edema, no erythema, no tenderness  Results for orders placed or performed during the hospital encounter of 03/01/17 (from the past 72 hour(s))  CBC     Status: Abnormal   Collection Time: 03/01/17 10:28 PM  Result Value Ref Range   WBC 11.3 (H) 3.6 - 11.0 K/uL   RBC 4.42 3.80 - 5.20 MIL/uL   Hemoglobin 12.7 12.0 - 16.0 g/dL   HCT 16.136.9 09.635.0 - 04.547.0 %   MCV 83.4 80.0 - 100.0 fL   MCH 28.7 26.0 - 34.0 pg   MCHC 34.4 32.0 - 36.0 g/dL   RDW 40.914.6 (H) 81.111.5 - 91.414.5 %   Platelets 256 150 - 440 K/uL  RPR     Status: None   Collection Time: 03/01/17 10:28 PM  Result Value Ref Range   RPR Ser Ql Non Reactive Non Reactive    Comment: (NOTE) Performed At: [facility] [address] [place], [place] [id number] Mila HomerHancock William F MD [phone]   Type and screen     Status: None   Collection Time: 03/01/17 11:08 PM  Result Value Ref Range   ABO/RH(D) O NEG    Antibody Screen NEG    Sample Expiration 03/04/2017   CBC     Status: Abnormal   Collection Time: 03/03/17  3:46 AM  Result Value Ref Range   WBC 11.3 (H) 3.6 - 11.0 K/uL   RBC 3.69 (L) 3.80 - 5.20 MIL/uL   Hemoglobin 10.5 (L) 12.0 - 16.0 g/dL   HCT 52.831.0 (L) 41.335.0 - 24.447.0 %   MCV 84.1 80.0 - 100.0 fL   MCH 28.4 26.0 - 34.0 pg   MCHC 33.7 32.0 - 36.0 g/dL   RDW 01.015.0 (H) 27.211.5 - 53.614.5 %   Platelets 200 150 - 440 K/uL     Assessment:   31 y.o. G2P1002 postoperativeday # 1 RLTCS   Plan:  1) Acute blood loss anemia -  hemodynamically stable and asymptomatic - po ferrous sulfate  2) Blood Type --/--/O NEG (06/09 2308) / Rubella Immune (11/07 0000) / Varicella Immune  3) TDAP status UTD  4) Breast/undecided  5) Disposition anticipate discharge POD2-3

## 2017-03-03 NOTE — Anesthesia Post-op Follow-up Note (Signed)
  Anesthesia Pain Follow-up Note  Patient: Loretta Jensen  Day #: 1  Date of Follow-up: 03/03/2017 Time: 7:13 AM  Last Vitals:  Vitals:   03/03/17 0001 03/03/17 0404  BP: (!) 94/57 (!) 95/53  Pulse: 85 72  Resp: 16 16  Temp: 36.9 C 36.7 C    Level of Consciousness: alert  Pain: none   Side Effects:None  Catheter Site Exam:clean, dry, no drainage     Plan: d/c from anesthesia care  [facility] Tesa Meadors

## 2017-03-04 ENCOUNTER — Telehealth: Payer: Self-pay

## 2017-03-04 ENCOUNTER — Encounter: Payer: Self-pay | Admitting: Certified Nurse Midwife

## 2017-03-04 NOTE — Progress Notes (Signed)
Post Partum Day 2 Subjective: voiding, tolerating PO and + flatus. Having pain in the left side of her incision/LLQ. Breast feeding  Objective: Blood pressure 121/71, pulse 84, temperature 98.1 F (36.7 C), temperature source Oral, resp. rate 18, height 5\' 4"  (1.626 m), weight 85.3 kg (188 lb), last menstrual period 05/29/2016, SpO2 100 %, unknown if currently breastfeeding.  Physical Exam:  General: alert, cooperative and no distress  Heart: RRR without murmur Lungs: CTAB Lochia: appropriate Uterine Fundus: firm/ U-1/ ML Abdominal Incision: healing well, no significant drainage, no significant erythema. ON Q intact BS active x 4, soft, non distended DVT Evaluation: No evidence of DVT seen on physical exam.   Recent Labs  03/01/17 2228 03/03/17 0346  HGB 12.7 10.5*  HCT 36.9 31.0*  WBC 11.3* 11.3*  PLT 256 200   Both mother and baby O negative blood type  Assessment/Plan: Stable POD #2 Rhogam not indicated Continue postoperative/ postpartum care.-ambulate more today. Plan for discharge tomorrow RI/ VI/ TDAP UTD Breast/ undecided   LOS: 3 days   Farrel ConnersColleen Analayah Brooke 03/04/2017, 11:27 AM

## 2017-03-04 NOTE — Progress Notes (Signed)
Pt and family watching period of purple cry. 

## 2017-03-04 NOTE — Telephone Encounter (Signed)
Melissa from [facility] calling to confirm date of delivery, type of delivery, hospital dates, and recovery time period approved by provider.  Left detailed msg of above c exception of discharge date as pt is still in hosp.

## 2017-03-05 MED ORDER — OXYCODONE-ACETAMINOPHEN 5-325 MG PO TABS: 1.0000 | ORAL_TABLET | ORAL | 0 refills | Status: DC | PRN

## 2017-03-05 MED ORDER — NORETHINDRONE 0.35 MG PO TABS: 1.0000 | ORAL_TABLET | Freq: Every day | ORAL | 11 refills | Status: DC

## 2017-03-05 NOTE — Progress Notes (Signed)
Post Partum Day 3 Subjective: voiding, tolerating PO and + flatus.  Breast feeding  Objective: Blood pressure 105/64, pulse 64, temperature 98.2 F (36.8 C), temperature source Oral, resp. rate 18, height 5\' 4"  (1.626 m), weight 188 lb (85.3 kg), last menstrual period 05/29/2016, SpO2 100 %, unknown if currently breastfeeding.  Physical Exam:  General: alert, cooperative and no distress  Heart: RRR without murmur Lungs: CTAB Lochia: appropriate Uterine Fundus: firm/ U-1/ ML Abdominal Incision: healing well, no significant drainage, no significant erythema. ON Q intact BS active x 4, soft, non distended DVT Evaluation: No evidence of DVT seen on physical exam.   Recent Labs  03/03/17 0346  HGB 10.5*  HCT 31.0*  WBC 11.3*  PLT 200   Both mother and baby O negative blood type  Assessment/Plan: Stable POD #3 Rhogam not indicated Continue postoperative/ postpartum care. Plan for discharge  RI/ VI/ TDAP UTD [facility]   LOS: 4 days   Loretta LibraRobert Paul Jensen 03/05/2017, 9:44 AM

## 2017-03-07 ENCOUNTER — Ambulatory Visit: Payer: Self-pay

## 2017-03-07 ENCOUNTER — Encounter: Payer: 59 | Admitting: Obstetrics & Gynecology

## 2017-03-07 NOTE — Lactation Note (Signed)
This note was copied from a baby's chart. Lactation Consultation Note  Patient Name: Loretta Jensen Today's Date: 03/07/2017     Maternal Data    Feeding    LATCH Score/Interventions                      Lactation Tools Discussed/Used     Consult Status      Loretta Jensen 03/07/2017, 3:19 PM

## 2017-03-07 NOTE — Lactation Note (Signed)
This note was copied from a baby's chart. Lactation Consultation Note  Patient Name: Loretta Jensen Today's Date: 03/07/2017     Maternal Data  Mom has cracked nipples from d/c on 03/05/17 and started using nipple shield and manual pump that same day. Parents wanted to come in for breastfeeding consultation. Mom presented with slight engorgement and red patches, not hot to touch, and she denies low grade fever or flu-like symptoms.   Feeding  "Loretta Jensen" was fed 20mL of Similac before the feeding time due to hunger. She immediately went onto the breast and took 20mL, fell asleep, and then took in an estimated 10mL more (no other pre/post wt was done) Mom was starting to get engorged.   LATCH Score/Interventions   Loretta Jensen was able to stay on the breast w/ the help of a 24mm nipple shield and took 20mL in 10mins on the breast after receiving 20mL in a bottle. Baby breastfeeds extremely well.                    Lactation Tools Discussed/Used  DEBP, Manual pump, 24mm nipple shield, cool gels   Consult Status  Mom is to make sure her breasts stay firm and soft and feeding baby every 3-4hrs on demand at the breast.     Burnadette PeterJaniya M Zach Tietje 03/07/2017, 2:08 PM

## 2017-03-10 ENCOUNTER — Inpatient Hospital Stay: Admission: RE | Admit: 2017-03-10 | Payer: 59 | Source: Ambulatory Visit

## 2017-03-10 ENCOUNTER — Encounter: Payer: 59 | Admitting: Obstetrics and Gynecology

## 2017-03-11 ENCOUNTER — Inpatient Hospital Stay: Admit: 2017-03-11 | Payer: No Typology Code available for payment source | Admitting: Obstetrics and Gynecology

## 2017-04-07 ENCOUNTER — Encounter: Payer: Self-pay | Admitting: Obstetrics and Gynecology

## 2017-04-07 ENCOUNTER — Ambulatory Visit (INDEPENDENT_AMBULATORY_CARE_PROVIDER_SITE_OTHER): Payer: 59 | Admitting: Obstetrics and Gynecology

## 2017-04-07 NOTE — Progress Notes (Signed)
Postpartum Visit   Chief Complaint  Patient presents with  . Postpartum Care    sporadic pain in C/S area    History of Present Illness: Patient is a 31 y.o. [id number] presents for postpartum visit.  Date of delivery: 03/02/17 Type of delivery: cesarean section  Episiotomy No.  Laceration: not applicable  Pregnancy or labor problems:  Yes, fetal intolerance during TOLAC (failed VBAC) Any problems since the delivery:  no  Newborn Details:  SINGLETON :  1. Baby's name: Loretta Jensen. Birth weight: 7 lb (3,170 grams) Maternal Details:  Breast Feeding:  Breast and formula Post partum depression/anxiety noted:  no Edinburgh Post-Partum Depression Score:  1  Date of last PAP: 07/2016  normal   Past Medical History:  Diagnosis Date  . Allergy   . Type O blood, Rh negative     Past Surgical History:  Procedure Laterality Date  . CESAREAN SECTION  2009  . CESAREAN SECTION N/A 03/02/2017   Procedure: CESAREAN SECTION;  Surgeon: Conard NovakJackson, Larrissa Stivers D, MD;  Location: [facility] ORS;  Service: Obstetrics;  Laterality: N/A;      Medication Sig Start Date End Date Taking? Authorizing Provider  norethindrone (MICRONOR,CAMILA,ERRIN) 0.35 MG tablet Take 1 tablet (0.35 mg total) by mouth daily. Start in 2 weeks. 03/05/17   Loretta Jensen, Robert P, MD  Prenatal Vit-Fe Fumarate-FA (PRENATAL MULTIVITAMIN) TABS tablet Take 1 tablet by mouth daily at 12 noon.    [provider]   Allergies: No Known Allergies   Social History   Social History  . Marital status: Single    Spouse name: N/A  . Number of children: N/A  . Years of education: N/A   Occupational History  . Not on file.   Social History Main Topics  . Smoking status: Never Smoker  . Smokeless tobacco: Never Used  . Alcohol use 0.0 oz/week     Comment: occasionally  . Drug use: No  . Sexual activity: Yes    Partners: Male   Other Topics Concern  . Not on file   Social History Narrative  . No narrative on file    Family  History  Problem Relation Age of Onset  . Cancer Mother   . Breast cancer Mother 1135  . Breast cancer Maternal Grandmother     Review of Systems  Constitutional: Negative.   HENT: Negative.   Eyes: Negative.   Respiratory: Negative.   Cardiovascular: Negative.   Gastrointestinal: Negative.   Genitourinary: Negative.   Musculoskeletal: Negative.   Skin: Negative.   Neurological: Negative.   Psychiatric/Behavioral: Negative.      Physical Exam BP 108/66 (BP Location: Left Arm, Patient Position: Sitting, Cuff Size: Normal)   Pulse 75   Ht 5\' 4"  (1.626 m)   Wt 173 lb (78.5 kg)   Breastfeeding? Yes   BMI 29.70 kg/m   Physical Exam  Constitutional: She is oriented to person, place, and time. She appears well-developed and well-nourished. No distress.  Genitourinary: Vagina normal and uterus normal. Pelvic exam was performed with patient supine. There is no rash, tenderness, lesion or injury on the right labia. There is no rash, tenderness, lesion or injury on the left labia. Right adnexum does not display mass, does not display tenderness and does not display fullness. Left adnexum does not display mass, does not display tenderness and does not display fullness. Cervix does not exhibit motion tenderness, lesion, discharge or polyp.   Uterus is mobile and anteverted. Uterus is not enlarged, tender, exhibiting a mass or  irregular (is regular).  HENT:  Head: Normocephalic and atraumatic.  Eyes: EOM are normal. No scleral icterus.  Neck: Normal range of motion. Neck supple.  Cardiovascular: Normal rate and regular rhythm.   Pulmonary/Chest: Effort normal and breath sounds normal. No respiratory distress. She has no wheezes. She has no rales.  Abdominal: Soft. Bowel sounds are normal. She exhibits no distension and no mass. There is no tenderness. There is no rebound and no guarding.  Incision without erythema, induration, warmth, and tenderness. It is clean, dry, and intact    Musculoskeletal: Normal range of motion. She exhibits no edema.  Neurological: She is alert and oriented to person, place, and time. No cranial nerve deficit.  Skin: Skin is warm and dry. No erythema.  Psychiatric: She has a normal mood and affect. Her behavior is normal. Judgment normal.    Female Chaperone present during breast and/or pelvic exam.  Assessment: 31 y.o. [id number] presenting for 6 week postpartum visit  Plan: Problem List Items Addressed This Visit    None       1) Contraception Education given regarding options for contraception, including Progesterone only OCPs.  2)  Pap - ASCCP guidelines and rational discussed.  Patient opts for routine screening interval  3) Patient underwent screening for postpartum depression with no concerns noted.  4) Follow up 1 year for routine annual exam  Thomasene Mohair, MD 04/07/2017 10:54 AM

## 2017-07-10 DIAGNOSIS — Z23 Encounter for immunization: Secondary | ICD-10-CM | POA: Diagnosis not present

## 2018-01-06 ENCOUNTER — Ambulatory Visit
Admission: EM | Admit: 2018-01-06 | Discharge: 2018-01-06 | Disposition: A | Discharge status: Home / Self Care | Payer: 59 | Attending: Family Medicine | Admitting: Family Medicine

## 2018-01-06 ENCOUNTER — Other Ambulatory Visit: Payer: Self-pay

## 2018-01-06 ENCOUNTER — Encounter: Payer: Self-pay | Admitting: Emergency Medicine

## 2018-01-06 DIAGNOSIS — J0100 Acute maxillary sinusitis, unspecified: Secondary | ICD-10-CM | POA: Diagnosis not present

## 2018-01-06 DIAGNOSIS — Z3201 Encounter for pregnancy test, result positive: Secondary | ICD-10-CM

## 2018-01-06 LAB — PREGNANCY, URINE: PREG TEST UR: POSITIVE — AB

## 2018-01-06 MED ORDER — AMOXICILLIN-POT CLAVULANATE 875-125 MG PO TABS: 1.0000 | ORAL_TABLET | Freq: Two times a day (BID) | ORAL | 0 refills | Status: DC

## 2018-01-06 NOTE — ED Provider Notes (Signed)
[facility] ____________________________________________  Time seen: Approximately 9:33 AM  I have reviewed the triage vital signs and the nursing notes.   HISTORY  Chief Complaint Cough   HPI Loretta Jensen is a 32 y.o. female presented for evaluation of 1-1.5 weeks of nasal congestion, nasal drainage, sinus pressure and cough.  States increased sinus pressure over the last 2 days.  States thick yellow nasal drainage.  States some throat irritation but denies typical sore throat.  States frequently coughing.  States continues to overall eat and drink well.  Denies known fevers.  States she has a history of seasonal allergies and feels that this is what initiated current sickness.  Has tried some over-the-counter Mucinex and Benadryl without much changes. Denies chest pain, shortness of breath, abdominal pain, dysuria, or rash. Denies recent sickness. Denies recent antibiotic use. denies other complaints.   Patient reports last period was in February.  States her menstrual's are normally irregular, and when asked if chance of pregnancy, patient states she does not know.  Discussed will evaluate urine pregnancy.  States that she is sexually active with same partner and they use barrier contraception.  Edwena Felty, MD: PCP Patient's last menstrual period was 11/05/2017 (approximate).   Past Medical History:  Diagnosis Date  . Allergy   . Type O blood, Rh negative     Patient Active Problem List   Diagnosis Date Noted  . Status post cesarean delivery 03/02/2017  . History of cesarean delivery 12/04/2016  . Allergic otitis media 09/29/2015  . Acute maxillary sinusitis 09/29/2015  . Occupational exposure to dust 05/23/2015  . Obesity, Class I, BMI 30-34.9 05/23/2015  . Influenza vaccination declined by patient 05/23/2015    Past Surgical History:  Procedure Laterality Date  . CESAREAN SECTION  2009  . CESAREAN SECTION N/A 03/02/2017   Procedure: CESAREAN  SECTION;  Surgeon: Conard Novak, MD;  Location: [facility] ORS;  Service: Obstetrics;  Laterality: N/A;     No current facility-administered medications for this encounter.   Current Outpatient Medications:  .  amoxicillin-clavulanate (AUGMENTIN) 875-125 MG tablet, Take 1 tablet by mouth every 12 (twelve) hours., Disp: 20 tablet, Rfl: 0  Allergies Patient has no known allergies.  Family History  Problem Relation Age of Onset  . Cancer Mother   . Breast cancer Mother 10  . Breast cancer Maternal Grandmother     Social History Social History   Tobacco Use  . Smoking status: Never Smoker  . Smokeless tobacco: Never Used  Substance Use Topics  . Alcohol use: Yes    Alcohol/week: 0.0 oz    Comment: occasionally  . Drug use: No    Review of Systems Constitutional: No fever/chills ENT: As above.  Cardiovascular: Denies chest pain. Respiratory: Denies shortness of breath. Gastrointestinal: No abdominal pain.  No nausea, no vomiting.  No diarrhea.   Genitourinary: Negative for dysuria. Musculoskeletal: Negative for back pain. Skin: Negative for rash. Neurological: Negative for focal weakness or numbness. _____________   PHYSICAL EXAM:  VITAL SIGNS: ED Triage Vitals  Enc Vitals Group     BP 01/06/18 0913 124/82     Pulse Rate 01/06/18 0913 94     Resp 01/06/18 0913 16     Temp 01/06/18 0913 98.4 F (36.9 C)     Temp Source 01/06/18 0913 Oral     SpO2 01/06/18 0913 97 %     Weight 01/06/18 0910 199 lb (90.3 kg)     Height 01/06/18 0910 5\' 4"  (1.626  m)     Head Circumference --      Peak Flow --      Pain Score 01/06/18 0910 10     Pain Loc --      Pain Edu? --      Excl. in GC? --     Constitutional: Alert and oriented. Well appearing and in no acute distress. Eyes: Conjunctivae are normal.  Head: Atraumatic.Mild tenderness to palpation bilateral frontal and mild to moderate tenderness bilateral maxillary sinuses. No swelling. No erythema.   Ears: no  erythema, normal TMs bilaterally.   Nose: nasal congestion with bilateral nasal turbinate erythema and edema.   Mouth/Throat: Mucous membranes are moist.  Oropharynx non-erythematous.No tonsillar swelling or exudate.  Neck: No stridor.  No cervical spine tenderness to palpation. Hematological/Lymphatic/Immunilogical: No cervical lymphadenopathy. Cardiovascular: Normal rate, regular rhythm. Grossly normal heart sounds.  Good peripheral circulation. Respiratory: Normal respiratory effort.  No retractions. No wheezes, rales or rhonchi. Good air movement.  Gastrointestinal: Soft and nontender.  No CVA tenderness. Musculoskeletal: No cervical, thoracic or lumbar tenderness to palpation.  Neurologic:  Normal speech and language. No gross focal neurologic deficits are appreciated. No gait instability. Skin:  Skin is warm, dry and intact. No rash noted. Psychiatric: Mood and affect are normal. Speech and behavior are normal.  ___________________________________________   LABS (all labs ordered are listed, but only abnormal results are displayed)  Labs Reviewed  PREGNANCY, URINE - Abnormal; Notable for the following components:      Result Value   Preg Test, Ur POSITIVE (*)    All other components within normal limits   ____________________________________________ RADIOLOGY  No results found. ____________________________________________   PROCEDURES Procedures   INITIAL IMPRESSION / ASSESSMENT AND PLAN / ED COURSE  Pertinent labs & imaging results that were available during my care of the patient were reviewed by me and considered in my medical decision making (see chart for details).  Well-appearing patient.  No acute distress.  Suspect sinusitis.  Discussed with patient, concern for possible pregnancy.  Urine pregnancy evaluated and found positive.  Patient last menstrual in February.  Unsure how far along patient is.  Patient denies any dysuria, vaginal discharge, vaginal bleeding or  abdominal pain.  Afebrile.  [facility]  Patient has a 5475-month-old and is not nursing.  Recommend for close follow-up with OB/GYN and schedule appointment today.  Start oral prenatal vitamins.  Patient reports that she called to schedule this prior to even leave in urgent care.  Will treat patient with oral Augmentin, over-the-counter Claritin as needed.Discussed indication, risks and benefits of medications with patient.  Discussed follow up with Primary care physician this week. Discussed follow up and return parameters including no resolution or any worsening concerns. Patient verbalized understanding and agreed to plan.   ____________________________________________   FINAL CLINICAL IMPRESSION(S) / ED DIAGNOSES  Final diagnoses:  Acute maxillary sinusitis, recurrence not specified  Positive pregnancy test     ED Discharge Orders        Ordered    amoxicillin-clavulanate (AUGMENTIN) 875-125 MG tablet  Every 12 hours     01/06/18 1041       Note: This dictation was prepared with Dragon dictation along with smaller phrase technology. Any transcriptional errors that result from this process are unintentional.         Renford DillsMiller, Mariselda Badalamenti, NP 01/06/18 1243

## 2018-01-06 NOTE — ED Triage Notes (Signed)
Patient c/o cough and chest congestion off and on for a week.  Patient also reports nasal congestion and sinus pressure.

## 2018-01-06 NOTE — Discharge Instructions (Addendum)
Take medication as prescribed. Rest. Drink plenty of fluids.  ° °Follow up with your primary care physician this week as needed. Return to Urgent care for new or worsening concerns.  ° °

## 2018-01-07 ENCOUNTER — Telehealth: Payer: Self-pay | Admitting: Emergency Medicine

## 2018-01-07 NOTE — Telephone Encounter (Signed)
Patient called requesting a work note for tomorrow.  Patient states that her throat is still very sore.  Patient was seen here on 4/16 and previously given a work note for 4/16 and 17.  Dr. Adriana Simasook was notified and approved giving her off from work 4/18. Patient notified to pick up her work note at the front desk.  Patient verbalized understanding.

## 2018-01-19 ENCOUNTER — Encounter: Payer: Self-pay | Admitting: Maternal Newborn

## 2018-01-19 ENCOUNTER — Ambulatory Visit (INDEPENDENT_AMBULATORY_CARE_PROVIDER_SITE_OTHER): Payer: 59 | Admitting: Maternal Newborn

## 2018-01-19 VITALS — BP 110/60 | Wt 197.0 lb

## 2018-01-19 DIAGNOSIS — E669 Obesity, unspecified: Secondary | ICD-10-CM

## 2018-01-19 DIAGNOSIS — B373 Candidiasis of vulva and vagina: Secondary | ICD-10-CM

## 2018-01-19 DIAGNOSIS — O09899 Supervision of other high risk pregnancies, unspecified trimester: Secondary | ICD-10-CM

## 2018-01-19 DIAGNOSIS — O0990 Supervision of high risk pregnancy, unspecified, unspecified trimester: Secondary | ICD-10-CM

## 2018-01-19 DIAGNOSIS — B3731 Acute candidiasis of vulva and vagina: Secondary | ICD-10-CM

## 2018-01-19 DIAGNOSIS — Z0189 Encounter for other specified special examinations: Secondary | ICD-10-CM

## 2018-01-19 HISTORY — DX: Supervision of other high risk pregnancies, unspecified trimester: O09.899

## 2018-01-19 MED ORDER — TERCONAZOLE 0.4 % VA CREA: 1.0000 | TOPICAL_CREAM | Freq: Every day | VAGINAL | 0 refills | Status: AC

## 2018-01-19 NOTE — Patient Instructions (Signed)
First Trimester of Pregnancy The first trimester of pregnancy is from week 1 until the end of week 13 (months 1 through 3). A week after a sperm fertilizes an egg, the egg will implant on the wall of the uterus. This embryo will begin to develop into a baby. Genes from you and your partner will form the baby. The female genes will determine whether the baby will be a boy or a girl. At 6-8 weeks, the eyes and face will be formed, and the heartbeat can be seen on ultrasound. At the end of 12 weeks, all the baby's organs will be formed. Now that you are pregnant, you will want to do everything you can to have a healthy baby. Two of the most important things are to get good prenatal care and to follow your health care provider's instructions. Prenatal care is all the medical care you receive before the baby's birth. This care will help prevent, find, and treat any problems during the pregnancy and childbirth. Body changes during your first trimester Your body goes through many changes during pregnancy. The changes vary from woman to woman.  You may gain or lose a couple of pounds at first.  You may feel sick to your stomach (nauseous) and you may throw up (vomit). If the vomiting is uncontrollable, call your health care provider.  You may tire easily.  You may develop headaches that can be relieved by medicines. All medicines should be approved by your health care provider.  You may urinate more often. Painful urination may mean you have a bladder infection.  You may develop heartburn as a result of your pregnancy.  You may develop constipation because certain hormones are causing the muscles that push stool through your intestines to slow down.  You may develop hemorrhoids or swollen veins (varicose veins).  Your breasts may begin to grow larger and become tender. Your nipples may stick out more, and the tissue that surrounds them (areola) may become darker.  Your gums may bleed and may be  sensitive to brushing and flossing.  Dark spots or blotches (chloasma, mask of pregnancy) may develop on your face. This will likely fade after the baby is born.  Your menstrual periods will stop.  You may have a loss of appetite.  You may develop cravings for certain kinds of food.  You may have changes in your emotions from day to day, such as being excited to be pregnant or being concerned that something may go wrong with the pregnancy and baby.  You may have more vivid and strange dreams.  You may have changes in your hair. These can include thickening of your hair, rapid growth, and changes in texture. Some women also have hair loss during or after pregnancy, or hair that feels dry or thin. Your hair will most likely return to normal after your baby is born.  What to expect at prenatal visits During a routine prenatal visit:  You will be weighed to make sure you and the baby are growing normally.  Your blood pressure will be taken.  Your abdomen will be measured to track your baby's growth.  The fetal heartbeat will be listened to between weeks 10 and 14 of your pregnancy.  Test results from any previous visits will be discussed.  Your health care provider may ask you:  How you are feeling.  If you are feeling the baby move.  If you have had any abnormal symptoms, such as leaking fluid, bleeding, severe headaches,   or abdominal cramping.  If you are using any tobacco products, including cigarettes, chewing tobacco, and electronic cigarettes.  If you have any questions.  Other tests that may be performed during your first trimester include:  Blood tests to find your blood type and to check for the presence of any previous infections. The tests will also be used to check for low iron levels (anemia) and protein on red blood cells (Rh antibodies). Depending on your risk factors, or if you previously had diabetes during pregnancy, you may have tests to check for high blood  sugar that affects pregnant women (gestational diabetes).  Urine tests to check for infections, diabetes, or protein in the urine.  An ultrasound to confirm the proper growth and development of the baby.  Fetal screens for spinal cord problems (spina bifida) and Down syndrome.  HIV (human immunodeficiency virus) testing. Routine prenatal testing includes screening for HIV, unless you choose not to have this test.  You may need other tests to make sure you and the baby are doing well.  Follow these instructions at home: Medicines  Follow your health care provider's instructions regarding medicine use. Specific medicines may be either safe or unsafe to take during pregnancy.  Take a prenatal vitamin that contains at least 600 micrograms (mcg) of folic acid.  If you develop constipation, try taking a stool softener if your health care provider approves. Eating and drinking  Eat a balanced diet that includes fresh fruits and vegetables, whole grains, good sources of protein such as meat, eggs, or tofu, and low-fat dairy. Your health care provider will help you determine the amount of weight gain that is right for you.  Avoid raw meat and uncooked cheese. These carry germs that can cause birth defects in the baby.  Eating four or five small meals rather than three large meals a day may help relieve nausea and vomiting. If you start to feel nauseous, eating a few soda crackers can be helpful. Drinking liquids between meals, instead of during meals, also seems to help ease nausea and vomiting.  Limit foods that are high in fat and processed sugars, such as fried and sweet foods.  To prevent constipation: ? Eat foods that are high in fiber, such as fresh fruits and vegetables, whole grains, and beans. ? Drink enough fluid to keep your urine clear or pale yellow. Activity  Exercise only as directed by your health care provider. Most women can continue their usual exercise routine during  pregnancy. Try to exercise for 30 minutes at least 5 days a week. Exercising will help you: ? Control your weight. ? Stay in shape. ? Be prepared for labor and delivery.  Experiencing pain or cramping in the lower abdomen or lower back is a good sign that you should stop exercising. Check with your health care provider before continuing with normal exercises.  Try to avoid standing for long periods of time. Move your legs often if you must stand in one place for a long time.  Avoid heavy lifting.  Wear low-heeled shoes and practice good posture.  You may continue to have sex unless your health care provider tells you not to. Relieving pain and discomfort  Wear a good support bra to relieve breast tenderness.  Take warm sitz baths to soothe any pain or discomfort caused by hemorrhoids. Use hemorrhoid cream if your health care provider approves.  Rest with your legs elevated if you have leg cramps or low back pain.  If you develop   varicose veins in your legs, wear support hose. Elevate your feet for 15 minutes, 3-4 times a day. Limit salt in your diet. Prenatal care  Schedule your prenatal visits by the twelfth week of pregnancy. They are usually scheduled monthly at first, then more often in the last 2 months before delivery.  Write down your questions. Take them to your prenatal visits.  Keep all your prenatal visits as told by your health care provider. This is important. Safety  Wear your seat belt at all times when driving.  Make a list of emergency phone numbers, including numbers for family, friends, the hospital, and police and fire departments. General instructions  Ask your health care provider for a referral to a local prenatal education class. Begin classes no later than the beginning of month 6 of your pregnancy.  Ask for help if you have counseling or nutritional needs during pregnancy. Your health care provider can offer advice or refer you to specialists for help  with various needs.  Do not use hot tubs, steam rooms, or saunas.  Do not douche or use tampons or scented sanitary pads.  Do not cross your legs for long periods of time.  Avoid cat litter boxes and soil used by cats. These carry germs that can cause birth defects in the baby and possibly loss of the fetus by miscarriage or stillbirth.  Avoid all smoking, herbs, alcohol, and medicines not prescribed by your health care provider. Chemicals in these products affect the formation and growth of the baby.  Do not use any products that contain nicotine or tobacco, such as cigarettes and e-cigarettes. If you need help quitting, ask your health care provider. You may receive counseling support and other resources to help you quit.  Schedule a dentist appointment. At home, brush your teeth with a soft toothbrush and be gentle when you floss. Contact a health care provider if:  You have dizziness.  You have mild pelvic cramps, pelvic pressure, or nagging pain in the abdominal area.  You have persistent nausea, vomiting, or diarrhea.  You have a bad smelling vaginal discharge.  You have pain when you urinate.  You notice increased swelling in your face, hands, legs, or ankles.  You are exposed to fifth disease or chickenpox.  You are exposed to German measles (rubella) and have never had it. Get help right away if:  You have a fever.  You are leaking fluid from your vagina.  You have spotting or bleeding from your vagina.  You have severe abdominal cramping or pain.  You have rapid weight gain or loss.  You vomit blood or material that looks like coffee grounds.  You develop a severe headache.  You have shortness of breath.  You have any kind of trauma, such as from a fall or a car accident. Summary  The first trimester of pregnancy is from week 1 until the end of week 13 (months 1 through 3).  Your body goes through many changes during pregnancy. The changes vary from  woman to woman.  You will have routine prenatal visits. During those visits, your health care provider will examine you, discuss any test results you may have, and talk with you about how you are feeling. This information is not intended to replace advice given to you by your health care provider. Make sure you discuss any questions you have with your health care provider. Document Released: 09/03/2001 Document Revised: 08/21/2016 Document Reviewed: 08/21/2016 Elsevier Interactive Patient Education  2018 [employer]  

## 2018-01-19 NOTE — Progress Notes (Addendum)
01/19/2018   Chief Complaint: Amenorrhea, positive home pregnancy test, desires prenatal care.  Transfer of Care Patient: no  History of Present Illness: Loretta Jensen is a 32 y.o. G3P2002 at 1w5dbased on Patient's last menstrual period on 11/05/2017 (approximate), with an Estimated Date of Delivery: 08/12/18, with the above CC.   Her periods were: irregular periods, about every other month even while on OCP. She was using no method when she conceived. Stopped OCP about two months ago. She has Positive signs or symptoms of nausea of pregnancy. She has Negative signs or symptoms of miscarriage or preterm labor She identifies Negative Zika risk factors for her and her partner On any different medications around the time she conceived/early pregnancy: No, occasionally takes Benadryl for seasonal allergy symptoms.  History of varicella: Yes   Recently treated with Augmentin for an acute sinus infection and has since developed vaginal itching/irritation and some white discharge.  ROS: A 12-point review of systems was performed and negative, except as stated in the above HPI.  OBGYN History: As per HPI. OB History  Gravida Para Term Preterm AB Living  _0 SAB TAB Ectopic Multiple Live Births        0 2    # Outcome Date GA Lbr Len/2nd Weight Sex Delivery Anes PTL Lv  3 Current           2 Term 03/02/17 320w4d6 lb 15.8 oz (3.17 kg) F CS-LTranv Spinal  LIV  1 Term 09/18/08 4062w0d lb 5 oz (3.317 kg) F CS-LTranv   LIV     Complications: Failure to Progress in Second Stage    Any issues with any prior pregnancies: no Any prior children are healthy, doing well, without any problems or issues: yes History of pap smears: Yes. Last pap smear 07/30/2016, NIL. History of STIs: Remote hx of chlamydia 9 years ago.   Past Medical History: Past Medical History:  Diagnosis Date  . Allergy   . Type O blood, Rh negative     Past Surgical History: Past Surgical History:  Procedure  Laterality Date  . CESAREAN SECTION  2009  . CESAREAN SECTION N/A 03/02/2017   Procedure: CESAREAN SECTION;  Surgeon: JacWill BonnetD;  Location: [facility] ORS;  Service: Obstetrics;  Laterality: N/A;    Family History:  Family History  Problem Relation Age of Onset  . Cancer Mother   . Breast cancer Mother 35 87 Breast cancer Maternal Grandmother   . Cancer Maternal Grandfather        2018 - liver  . Breast cancer Other        2018  . Breast cancer Other        2013  . Breast cancer Other        1990s   She denies any female cancers, bleeding or blood clotting disorders.  She denies any history of intellectual disability, birth defects or genetic disorders in her or the FOB's history  Social History:  Social History   Socioeconomic History  . Marital status: Single    Spouse name: Not on file  . Number of children: 2  . Years of education: 13 74 Highest education level: Not on file  Occupational History  . Occupation: laminate and crush foam    Comment: [employer]  Social Needs  . Financial resource strain: Not on file  . Food insecurity:    Worry: Not on file  Inability: Not on file  . Transportation needs:    Medical: Not on file    Non-medical: Not on file  Tobacco Use  . Smoking status: Never Smoker  . Smokeless tobacco: Never Used  Substance and Sexual Activity  . Alcohol use: Not Currently    Alcohol/week: 0.0 oz    Comment: occasionally  . Drug use: No  . Sexual activity: Yes    Partners: Male    Birth control/protection: None  Lifestyle  . Physical activity:    Days per week: Not on file    Minutes per session: Not on file  . Stress: Not on file  Relationships  . Social connections:    Talks on phone: Not on file    Gets together: Not on file    Attends religious service: Not on file    Active member of club or organization: Not on file    Attends meetings of clubs or organizations: Not on file    Relationship status: Not on file  .  Intimate partner violence:    Fear of current or ex partner: Not on file    Emotionally abused: Not on file    Physically abused: Not on file    Forced sexual activity: Not on file  Other Topics Concern  . Not on file  Social History Narrative  . Not on file   Any cats in the household: no Denies history of and current domestic violence.  Allergy: No Known Allergies  Current Outpatient Medications:  Current Outpatient Medications:  .  diphenhydrAMINE (BENADRYL) 25 MG tablet, Take 25 mg by mouth as needed., Disp: , Rfl:    Physical Exam:   BP 110/60   Wt 197 lb (89.4 kg)   LMP 11/05/2017 (Approximate)   BMI 33.81 kg/m  Body mass index is 33.81 kg/m. Constitutional: Well nourished, well developed female in no acute distress.  Neck:  Supple, normal appearance, and no thyromegaly  Cardiovascular: S1, S2 normal, no murmur, rub or gallop, regular rate and rhythm Respiratory:  Clear to auscultation bilaterally. Normal respiratory effort Abdomen: no masses, hernias; diffusely non tender to palpation, non distended Breasts: breasts appear normal, no suspicious masses, no skin or nipple changes or axillary nodes. Neuro/Psych:  Normal mood and affect.  Skin:  Warm and dry.  Lymphatic:  No inguinal lymphadenopathy.   Pelvic exam: is not limited by body habitus External genitalia, Bartholin's glands, Urethra, Skene's glands: within normal limits except for some thick white discharge Vagina: within normal limits and with no blood in the vault  Cervix: normal appearing cervix without discharge or lesions, closed/long/high Uterus:  enlarged: consistent with pregnancy, some tenderness around right edge of Cesarean scar externally Adnexa:  no mass, fullness, tenderness  Assessment: Loretta Jensen is a 32 y.o. O0H2122 at 52w5dbased on Patient's last menstrual period on 11/05/2017 (approximate), with an Estimated Date of Delivery: 08/12/18, presenting for prenatal care.  Plan:  1) Avoid  alcoholic beverages. 2) Patient encouraged not to smoke.  3) Discontinue the use of all non-medicinal drugs and chemicals.  4) Take prenatal vitamins daily.  5) Seatbelt use advised 6) Nutrition, food safety (fish, cheese advisories, and high nitrite foods) and exercise discussed. 7) [facility]  8) Patient is asked about travel to areas at risk for the ZBlairsvirus, and counseled to avoid travel and exposure to mosquitoes or sexual partners who may have themselves been exposed to the virus. Testing is discussed, and will be  ordered as appropriate.  9) Childbirth classes at [facility] advised 10) Genetic Screening, such as with 1st Trimester Screening, cell free fetal DNA, AFP testing, and Ultrasound, as well as with amniocentesis and CVS as appropriate, is discussed with patient. She plans to have genetic testing this pregnancy. 11) Sample of Bonjesta given, she will call for Rx if effective. 12) GTT for BMI>30 and NOB labs ordered, patient to return fasting to complete. 13) Ultrasound ordered for dating and viability. 14) Terconazole sent to pharmacy for yeast infection. 15) Myriad genetic screening for patient due to family history of breast cancer to be drawn with NOB labs.  Problem list reviewed and updated.  Return in about 1 day (around 01/20/2018) for [place] with ultrasound/GTT/NOB labs.  Avel Sensor, CNM [facility], [facility] 01/19/2018  1:43 PM

## 2018-01-19 NOTE — Progress Notes (Signed)
C/O thinks she has a yeast inf from antibx she has taken.rj

## 2018-01-20 ENCOUNTER — Other Ambulatory Visit: Payer: 59

## 2018-01-20 ENCOUNTER — Encounter: Payer: 59 | Admitting: Obstetrics and Gynecology

## 2018-01-20 DIAGNOSIS — O0990 Supervision of high risk pregnancy, unspecified, unspecified trimester: Secondary | ICD-10-CM | POA: Diagnosis not present

## 2018-01-20 DIAGNOSIS — E669 Obesity, unspecified: Secondary | ICD-10-CM

## 2018-01-21 LAB — GC/CHLAMYDIA PROBE AMP
Chlamydia trachomatis, NAA: NEGATIVE
Neisseria gonorrhoeae by PCR: NEGATIVE

## 2018-01-21 LAB — URINE DRUG PANEL 7
Amphetamines, Urine: NEGATIVE ng/mL
Barbiturate Quant, Ur: NEGATIVE ng/mL
Benzodiazepine Quant, Ur: NEGATIVE ng/mL
CANNABINOID QUANT UR: NEGATIVE ng/mL
COCAINE (METAB.): NEGATIVE ng/mL
OPIATE QUANT UR: NEGATIVE ng/mL
PCP QUANT UR: NEGATIVE ng/mL

## 2018-01-21 LAB — URINE CULTURE

## 2018-01-22 LAB — HEMOGLOBINOPATHY EVALUATION
HEMOGLOBIN F QUANTITATION: 0 % (ref 0.0–2.0)
HGB A: 97.9 % (ref 96.4–98.8)
HGB C: 0 %
HGB S: 0 %
HGB VARIANT: 0 %
Hemoglobin A2 Quantitation: 2.1 % (ref 1.8–3.2)

## 2018-01-22 LAB — RPR+RH+ABO+RUB AB+AB SCR+CB...
ANTIBODY SCREEN: NEGATIVE
HEMATOCRIT: 37.1 % (ref 34.0–46.6)
HIV Screen 4th Generation wRfx: NONREACTIVE
Hemoglobin: 12.2 g/dL (ref 11.1–15.9)
Hepatitis B Surface Ag: NEGATIVE
MCH: 26.3 pg — AB (ref 26.6–33.0)
MCHC: 32.9 g/dL (ref 31.5–35.7)
MCV: 80 fL (ref 79–97)
Platelets: 296 10*3/uL (ref 150–379)
RBC: 4.64 x10E6/uL (ref 3.77–5.28)
RDW: 15.6 % — AB (ref 12.3–15.4)
RH TYPE: NEGATIVE
RPR Ser Ql: NONREACTIVE
Rubella Antibodies, IGG: 3.62 index (ref >0.99)
Varicella zoster IgG: 970 index (ref >165)
WBC: 9.1 10*3/uL (ref 3.4–10.8)

## 2018-01-22 LAB — GLUCOSE, 1 HOUR GESTATIONAL: GESTATIONAL DIABETES SCREEN: 96 mg/dL (ref 65–139)

## 2018-01-23 ENCOUNTER — Ambulatory Visit (INDEPENDENT_AMBULATORY_CARE_PROVIDER_SITE_OTHER): Payer: 59

## 2018-01-23 ENCOUNTER — Ambulatory Visit (INDEPENDENT_AMBULATORY_CARE_PROVIDER_SITE_OTHER): Payer: 59 | Admitting: Maternal Newborn

## 2018-01-23 VITALS — BP 110/60 | Wt 195.0 lb

## 2018-01-23 DIAGNOSIS — Z3689 Encounter for other specified antenatal screening: Secondary | ICD-10-CM

## 2018-01-23 DIAGNOSIS — G43909 Migraine, unspecified, not intractable, without status migrainosus: Secondary | ICD-10-CM | POA: Insufficient documentation

## 2018-01-23 DIAGNOSIS — O0990 Supervision of high risk pregnancy, unspecified, unspecified trimester: Secondary | ICD-10-CM

## 2018-01-23 DIAGNOSIS — G43709 Chronic migraine without aura, not intractable, without status migrainosus: Secondary | ICD-10-CM

## 2018-01-23 DIAGNOSIS — Z0189 Encounter for other specified special examinations: Secondary | ICD-10-CM

## 2018-01-23 DIAGNOSIS — R8271 Bacteriuria: Secondary | ICD-10-CM

## 2018-01-23 MED ORDER — AMOXICILLIN 500 MG PO CAPS: 500.0000 mg | ORAL_CAPSULE | Freq: Three times a day (TID) | ORAL | 0 refills | Status: AC

## 2018-01-23 NOTE — Progress Notes (Signed)
    Routine Prenatal Care Visit  Subjective  Loretta Jensen is a 32 y.o. G3P2002 at 67w2dbeing seen today for ongoing prenatal care.  She is currently monitored for the following issues for this high-risk pregnancy and has Occupational exposure to dust; Obesity, Class I, BMI 30-34.9; Influenza vaccination declined by patient; Allergic otitis media; History of cesarean delivery; Status post cesarean delivery; Supervision of high risk pregnancy, antepartum; Short interval between pregnancies affecting pregnancy, antepartum; and Migraine headache on their problem list.  ----------------------------------------------------------------------------------- Patient reports recent migraine with vomiting. This has resolved. Vag. Bleeding: None.   ----------------------------------------------------------------------------------- The following portions of the patient's history were reviewed and updated as appropriate: allergies, current medications, past family history, past medical history, past social history, past surgical history and problem list. Problem list updated.   Objective  Blood pressure 110/60, weight 195 lb (88.5 kg), last menstrual period 11/05/2017, not currently breastfeeding. Pregravid weight 199 lb (90.3 kg) Total Weight Gain -4 lb (-1.814 kg) Urinalysis: Urine Protein: Negative Urine Glucose: Negative  Fetal Status: Fetal Heart Rate (bpm): 153         General:  Alert, oriented and cooperative. Patient is in no acute distress.  Skin: Skin is warm and dry. No rash noted.   Cardiovascular: Normal heart rate noted  Respiratory: Normal respiratory effort, no problems with respiration noted  Abdomen: Soft, gravid, appropriate for gestational age. Pain/Pressure: Absent     Pelvic:  Cervical exam deferred        Extremities: Normal range of motion.     Mental Status: Normal mood and affect. Normal behavior. Normal judgment and thought content.     Assessment   32y.o. [id number] at 15w 0d, EDD 07/17/2018 by Last Menstrual Period presenting for routine prenatal visit.  Plan   THIRD Problems (from 01/19/18 to present)    Problem Noted Resolved   Supervision of high risk pregnancy, antepartum 01/19/2018 by SRexene Agent CNM No   Overview Addendum 01/19/2018  2:35 PM by SRexene Agent CCheswoldPrenatal Labs  Dating  Blood type: --/--/O NEG (06/09 2308)   Genetic Screen 1 Screen:    AFP:     Quad:     NIPS: Antibody:NEG (06/09 2308)  Anatomic [place] Rubella:   Varicella:    GTT Early:               Third trimester:  RPR: Non Reactive (06/09 2228)   Rhogam  HBsAg:     TDaP vaccine                       Flu Shot: HIV:     Baby Food                                GCVE:LFYBOFBP(05/30 1534)  Contraception  Pap: 07/30/16, NIL  CBB     CS/VBAC    Support Person                 Single IUP with measurement correlating with 15w, which is >3 weeks later than LMP dating. EDD changed to 07/17/2018 per ACOG Dating Criteria.   Rx sent for amoxicillin for asymptomatic bacteruria - GBS.  Desires Quad screen and Myriad testing next visit.  Return in about 1 month (around 02/20/2018) for ROB and anatomy scan/Quad screen.  JAvel Sensor CNM 01/23/2018  1:56 PM

## 2018-01-23 NOTE — Progress Notes (Signed)
C/o vomiting.rj

## 2018-02-05 ENCOUNTER — Encounter: Payer: 59 | Admitting: Family Medicine

## 2018-02-05 ENCOUNTER — Encounter

## 2018-02-23 ENCOUNTER — Encounter: Payer: 59 | Admitting: Maternal Newborn

## 2018-02-23 ENCOUNTER — Other Ambulatory Visit: Payer: 59

## 2018-03-02 ENCOUNTER — Ambulatory Visit (INDEPENDENT_AMBULATORY_CARE_PROVIDER_SITE_OTHER): Payer: 59

## 2018-03-02 ENCOUNTER — Encounter: Payer: 59 | Admitting: Obstetrics and Gynecology

## 2018-03-02 DIAGNOSIS — Z3A20 20 weeks gestation of pregnancy: Secondary | ICD-10-CM

## 2018-03-02 DIAGNOSIS — Z3689 Encounter for other specified antenatal screening: Secondary | ICD-10-CM

## 2018-03-04 ENCOUNTER — Ambulatory Visit (INDEPENDENT_AMBULATORY_CARE_PROVIDER_SITE_OTHER): Payer: 59 | Admitting: Obstetrics and Gynecology

## 2018-03-04 ENCOUNTER — Encounter: Payer: Self-pay | Admitting: Obstetrics and Gynecology

## 2018-03-04 VITALS — BP 100/60 | Wt 194.0 lb

## 2018-03-04 DIAGNOSIS — Z98891 History of uterine scar from previous surgery: Secondary | ICD-10-CM

## 2018-03-04 DIAGNOSIS — Z3A20 20 weeks gestation of pregnancy: Secondary | ICD-10-CM

## 2018-03-04 DIAGNOSIS — O99210 Obesity complicating pregnancy, unspecified trimester: Secondary | ICD-10-CM

## 2018-03-04 DIAGNOSIS — O0990 Supervision of high risk pregnancy, unspecified, unspecified trimester: Secondary | ICD-10-CM

## 2018-03-04 DIAGNOSIS — O99212 Obesity complicating pregnancy, second trimester: Secondary | ICD-10-CM

## 2018-03-04 DIAGNOSIS — O09899 Supervision of other high risk pregnancies, unspecified trimester: Secondary | ICD-10-CM

## 2018-03-04 DIAGNOSIS — Z6834 Body mass index (BMI) 34.0-34.9, adult: Secondary | ICD-10-CM

## 2018-03-04 HISTORY — DX: Obesity complicating pregnancy, unspecified trimester: O99.210

## 2018-03-04 NOTE — Progress Notes (Signed)
Routine Prenatal Care Visit  Subjective  Loretta Jensen is a 32 y.o. G3P2002 at 5034w5d being seen today for ongoing prenatal care.  She is currently monitored for the following issues for this high-risk pregnancy and has Occupational exposure to dust; Obesity, Class I, BMI 30-34.9; Influenza vaccination declined by patient; Allergic otitis media; History of cesarean delivery; Status post cesarean delivery; Supervision of high risk pregnancy, antepartum; Short interval between pregnancies affecting pregnancy, antepartum; Migraine headache; Obesity affecting pregnancy; and BMI 34.0-34.9,adult on their problem list.  ----------------------------------------------------------------------------------- Patient reports still has mild n/v.  Managing it with conservative measures.    . Vag. Bleeding: None.   . Denies leaking of fluid.  ----------------------------------------------------------------------------------- The following portions of the patient's history were reviewed and updated as appropriate: allergies, current medications, past family history, past medical history, past social history, past surgical history and problem list. Problem list updated.   Objective  Blood pressure 100/60, weight 194 lb (88 kg), last menstrual period 11/05/2017, not currently breastfeeding. Pregravid weight 199 lb (90.3 kg) Total Weight Gain -5 lb (-2.268 kg) Urinalysis: Urine Protein: Negative Urine Glucose: Negative  Fetal Status: Fetal Heart Rate (bpm): Present         General:  Alert, oriented and cooperative. Patient is in no acute distress.  Skin: Skin is warm and dry. No rash noted.   Cardiovascular: Normal heart rate noted  Respiratory: Normal respiratory effort, no problems with respiration noted  Abdomen: Soft, gravid, appropriate for gestational age. Pain/Pressure: Absent     Pelvic:  Cervical exam deferred        Extremities: Normal range of motion.     Mental Status: Normal mood and affect.  Normal behavior. Normal judgment and thought content.   Assessment   32 y.o. [id number] at 7234w5d by  07/17/2018, by Ultrasound presenting for routine prenatal visit  Plan   THIRD Problems (from 01/19/18 to present)    Problem Noted Resolved   Obesity affecting pregnancy 03/04/2018 by Conard NovakJackson, Cheryln Balcom D, MD No   BMI 34.0-34.9,adult 03/04/2018 by Conard NovakJackson, Tracer Gutridge D, MD No   Migraine headache 01/23/2018 by Oswaldo ConroySchmid, Jacelyn Y, CNM No   Supervision of high risk pregnancy, antepartum 01/19/2018 by Oswaldo ConroySchmid, Jacelyn Y, CNM No   Overview Addendum 01/19/2018  2:35 PM by Oswaldo ConroySchmid, Jacelyn Y, CNM    Clinic [facility] Prenatal Labs  Dating  Blood type: --/--/O NEG (06/09 2308)   Genetic Screen 1 Screen:    AFP:     Quad:     NIPS: Antibody:NEG (06/09 2308)  Anatomic US  Rubella:   Varicella:    GTT Early:               Third trimester:  RPR: Non Reactive (06/09 2228)   Rhogam  HBsAg:     TDaP vaccine                       Flu Shot: HIV:     Baby Food                                AVW:UJWJXBJYGBS:Negative (05/30 1534)  Contraception  Pap: 07/30/16, NIL  CBB     CS/VBAC    Support Person              Short interval between pregnancies affecting pregnancy, antepartum 01/19/2018 by Oswaldo ConroySchmid, Jacelyn Y, CNM No   History of cesarean delivery 12/04/2016 by Nadara MustardHarris, Robert P, MD  No       Preterm labor symptoms and general obstetric precautions including but not limited to vaginal bleeding, contractions, leaking of fluid and fetal movement were reviewed in detail with the patient. Please refer to After Visit Summary for other counseling recommendations.   -Reviewed findings on U/s of renal pyelectasis. Will repeat u/s at 28 weeks to verify has resolved.  Return in about 1 month (around 04/01/2018) for Routine Prenatal Appointment.  Thomasene Mohair, MD, [facility], [facility] 03/04/2018 4:28 PM

## 2018-03-04 NOTE — Progress Notes (Incomplete)
Routine Prenatal Care Visit  Subjective  Loretta Jensen is a 32 y.o. G3P2002 at 6780w5d being seen today for ongoing prenatal care.  She is currently monitored for the following issues for this {Blank single:19197::"high-risk","low-risk"} pregnancy and has Occupational exposure to dust; Obesity, Class I, BMI 30-34.9; Influenza vaccination declined by patient; Allergic otitis media; History of cesarean delivery; Status post cesarean delivery; Supervision of high risk pregnancy, antepartum; Short interval between pregnancies affecting pregnancy, antepartum; Migraine headache; Obesity affecting pregnancy; and BMI 34.0-34.9,adult on their problem list.  ----------------------------------------------------------------------------------- Patient reports {sx:14538}.    . Vag. Bleeding: None.   . Denies leaking of fluid.  ----------------------------------------------------------------------------------- The following portions of the patient's history were reviewed and updated as appropriate: allergies, current medications, past family history, past medical history, past social history, past surgical history and problem list. Problem list updated.   Objective  Blood pressure 100/60, weight 194 lb (88 kg), last menstrual period 11/05/2017, not currently breastfeeding. Pregravid weight 199 lb (90.3 kg) Total Weight Gain -5 lb (-2.268 kg) Urinalysis: Urine Protein: Negative Urine Glucose: Negative  Fetal Status: Fetal Heart Rate (bpm): Present         General:  Alert, oriented and cooperative. Patient is in no acute distress.  Skin: Skin is warm and dry. No rash noted.   Cardiovascular: Normal heart rate noted  Respiratory: Normal respiratory effort, no problems with respiration noted  Abdomen: Soft, gravid, appropriate for gestational age. Pain/Pressure: Absent     Pelvic:  {Blank single:19197::"Cervical exam performed","Cervical exam deferred"}        Extremities: Normal range of motion.       Mental Status: Normal mood and affect. Normal behavior. Normal judgment and thought content.   Assessment   31 y.o. [id number] at 4680w5d by  07/17/2018, by Ultrasound presenting for {Blank single:19197::"routine","work-in"} prenatal visit  Plan   THIRD Problems (from 01/19/18 to present)    Problem Noted Resolved   Obesity affecting pregnancy 03/04/2018 by Conard NovakJackson, Zeah Germano D, MD No   BMI 34.0-34.9,adult 03/04/2018 by Conard NovakJackson, Claudean Leavelle D, MD No   Migraine headache 01/23/2018 by Oswaldo ConroySchmid, Jacelyn Y, CNM No   Supervision of high risk pregnancy, antepartum 01/19/2018 by Oswaldo ConroySchmid, Jacelyn Y, CNM No   Overview Addendum 01/19/2018  2:35 PM by Oswaldo ConroySchmid, Jacelyn Y, CNM    Clinic [facility] Prenatal Labs  Dating  Blood type: --/--/O NEG (06/09 2308)   Genetic Screen 1 Screen:    AFP:     Quad:     NIPS: Antibody:NEG (06/09 2308)  Anatomic US  Rubella:   Varicella:    GTT Early:               Third trimester:  RPR: Non Reactive (06/09 2228)   Rhogam  HBsAg:     TDaP vaccine                       Flu Shot: HIV:     Baby Food                                AVW:UJWJXBJYGBS:Negative (05/30 1534)  Contraception  Pap: 07/30/16, NIL  CBB     CS/VBAC    Support Person              Short interval between pregnancies affecting pregnancy, antepartum 01/19/2018 by Oswaldo ConroySchmid, Jacelyn Y, CNM No   History of cesarean delivery 12/04/2016 by Nadara MustardHarris, Robert P, MD No       {  Blank single:19197::"Term","Preterm"} labor symptoms and general obstetric precautions including but not limited to vaginal bleeding, contractions, leaking of fluid and fetal movement were reviewed in detail with the patient. Please refer to After Visit Summary for other counseling recommendations.   Return in about 1 month (around 04/01/2018) for Routine Prenatal Appointment.  Thomasene Mohair, MD, [facility], [facility] 03/04/2018 4:28 PM

## 2018-03-12 ENCOUNTER — Telehealth: Payer: Self-pay

## 2018-03-12 NOTE — Telephone Encounter (Signed)
FMLA/DISABILITY form filled [place] for [employer] [facility], signature obtained, and given to [place] for processing.

## 2018-03-12 NOTE — Telephone Encounter (Signed)
Pt called triage line this morning concerned about having low back pain and feeling nauseated.   Pt reassured about round ligament pain. I also, advised bland food/staying hydrated and pelvic rest. She was thankful of the call.  Kim CMA (AAMA)

## 2018-04-03 ENCOUNTER — Ambulatory Visit (INDEPENDENT_AMBULATORY_CARE_PROVIDER_SITE_OTHER): Payer: 59 | Admitting: Obstetrics & Gynecology

## 2018-04-03 VITALS — BP 116/70 | Wt 194.0 lb

## 2018-04-03 DIAGNOSIS — O2342 Unspecified infection of urinary tract in pregnancy, second trimester: Secondary | ICD-10-CM | POA: Diagnosis not present

## 2018-04-03 DIAGNOSIS — O99212 Obesity complicating pregnancy, second trimester: Secondary | ICD-10-CM

## 2018-04-03 DIAGNOSIS — O09899 Supervision of other high risk pregnancies, unspecified trimester: Secondary | ICD-10-CM

## 2018-04-03 DIAGNOSIS — O0990 Supervision of high risk pregnancy, unspecified, unspecified trimester: Secondary | ICD-10-CM

## 2018-04-03 DIAGNOSIS — O34219 Maternal care for unspecified type scar from previous cesarean delivery: Secondary | ICD-10-CM

## 2018-04-03 DIAGNOSIS — O99820 Streptococcus B carrier state complicating pregnancy: Secondary | ICD-10-CM

## 2018-04-03 DIAGNOSIS — Z98891 History of uterine scar from previous surgery: Secondary | ICD-10-CM

## 2018-04-03 DIAGNOSIS — Z3A25 25 weeks gestation of pregnancy: Secondary | ICD-10-CM

## 2018-04-03 DIAGNOSIS — B951 Streptococcus, group B, as the cause of diseases classified elsewhere: Secondary | ICD-10-CM

## 2018-04-03 DIAGNOSIS — Z362 Encounter for other antenatal screening follow-up: Secondary | ICD-10-CM

## 2018-04-03 NOTE — Progress Notes (Signed)
  Subjective  Fetal Movement? yes Contractions? no Leaking Fluid? no Vaginal Bleeding? no  Objective  BP 116/70   Wt 194 lb (88 kg)   LMP 11/05/2017 (Approximate)   BMI 33.30 kg/m  General: NAD Pumonary: no increased work of breathing Abdomen: gravid, non-tender Extremities: no edema Psychiatric: mood appropriate, affect full  Assessment  31 y.o. [id number] at 5437w0d by  07/17/2018, by Ultrasound presenting for routine prenatal visit  Plan   Problem List Items Addressed This Visit      Other   Status post cesarean delivery   Supervision of high risk pregnancy, antepartum   Short interval between pregnancies affecting pregnancy, antepartum   Obesity affecting pregnancy    Other Visit Diagnoses    [redacted] weeks gestation of pregnancy    -  Primary   Relevant Orders   28 Weeks RH-Panel   Encounter for other antenatal screening follow-up       Relevant Orders   US OB Follow Up   Group B Streptococcus urinary tract infection affecting pregnancy in second trimester       Relevant Orders   Urine Culture    Pain in back and lower abdominal pain(pt fell at work 04/01/18)    Monitor for worsening pain; no sign of concern today US nv for prior renal pyelectasis 28 week labs and Rhogam nv U Culture today Plan CS 39 weeks  Annamarie MajorPaul Emalene Welte, MD, Merlinda FrederickFACOG [facility], [facility] 04/03/2018  4:38 PM

## 2018-04-03 NOTE — Patient Instructions (Signed)

## 2018-04-08 LAB — URINE CULTURE

## 2018-04-14 ENCOUNTER — Encounter: Payer: Self-pay | Admitting: Obstetrics & Gynecology

## 2018-04-21 ENCOUNTER — Encounter (HOSPITAL_COMMUNITY): Payer: Self-pay

## 2018-04-24 ENCOUNTER — Encounter: Payer: Self-pay | Admitting: Maternal Newborn

## 2018-04-24 ENCOUNTER — Ambulatory Visit (INDEPENDENT_AMBULATORY_CARE_PROVIDER_SITE_OTHER): Payer: 59 | Admitting: Maternal Newborn

## 2018-04-24 VITALS — BP 120/80 | Wt 191.0 lb

## 2018-04-24 DIAGNOSIS — R197 Diarrhea, unspecified: Secondary | ICD-10-CM

## 2018-04-24 DIAGNOSIS — R109 Unspecified abdominal pain: Secondary | ICD-10-CM

## 2018-04-24 DIAGNOSIS — O34219 Maternal care for unspecified type scar from previous cesarean delivery: Secondary | ICD-10-CM

## 2018-04-24 DIAGNOSIS — O9989 Other specified diseases and conditions complicating pregnancy, childbirth and the puerperium: Secondary | ICD-10-CM

## 2018-04-24 DIAGNOSIS — O0990 Supervision of high risk pregnancy, unspecified, unspecified trimester: Secondary | ICD-10-CM

## 2018-04-24 DIAGNOSIS — O219 Vomiting of pregnancy, unspecified: Secondary | ICD-10-CM

## 2018-04-24 DIAGNOSIS — Z6834 Body mass index (BMI) 34.0-34.9, adult: Secondary | ICD-10-CM

## 2018-04-24 DIAGNOSIS — O99213 Obesity complicating pregnancy, third trimester: Secondary | ICD-10-CM

## 2018-04-24 DIAGNOSIS — Z3A28 28 weeks gestation of pregnancy: Secondary | ICD-10-CM

## 2018-04-24 MED ORDER — ONDANSETRON 4 MG PO TBDP: 4.0000 mg | ORAL_TABLET | Freq: Four times a day (QID) | ORAL | 0 refills | Status: DC | PRN

## 2018-04-24 NOTE — Progress Notes (Signed)
Prenatal Care Problem Visit  Subjective  Loretta Jensen is a 32 y.o. G3P2002 at [redacted]w[redacted]d being seen today for ongoing prenatal care.  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  ----------------------------------------------------------------------------------- Patient reports pain in her stomach. She has intermittent pain that has lasted for several days and is not relieved by Tylenol. She has had some nausea and vomiting and some diarrhea as well. Able to keep down food and liquids at this time. Some uterine irritation but not regular contractions.  Vag. Bleeding: None.  Movement: Present. No leaking of fluid.  ----------------------------------------------------------------------------------- The following portions of the patient's history were reviewed and updated as appropriate: allergies, current medications, past family history, past medical history, past social history, past surgical history and problem list. Problem list updated.   Objective  Blood pressure 120/80, weight 191 lb (86.6 kg), last menstrual period 11/05/2017. Pregravid weight 199 lb (90.3 kg) Total Weight Gain -8 lb (-3.629 kg) Urinalysis:      Fetal Status: Fetal Heart Rate (bpm): 144   Movement: Present     General:  Alert, oriented and cooperative. Patient is in no acute distress.  Skin: Skin is warm and dry. No rash noted.   Cardiovascular: Normal heart rate noted  Respiratory: Normal respiratory effort, no problems with respiration  noted  Abdomen: Soft, gravid, appropriate for gestational age. Pain/Pressure: Present     Pelvic:  Cervical exam deferred        Extremities: Normal range of motion.     Mental Status: Normal mood and affect. Normal behavior. Normal judgment and thought content.     Assessment   31 y.o. [id number] at [redacted]w[redacted]d, EDD 07/17/2018 by Ultrasound presenting for work-in prenatal visit.  Plan   THIRD Problems (from 01/19/18 to present)    Problem Noted Resolved   Obesity affecting pregnancy 03/04/2018 by Conard Novak, MD No   BMI 34.0-34.9,adult 03/04/2018 by Conard Novak, MD No   Migraine headache 01/23/2018 by Oswaldo Conroy, CNM No   Supervision of high risk pregnancy, antepartum 01/19/2018 by Oswaldo Conroy, CNM No   Overview Addendum 01/19/2018  2:35 PM by Oswaldo Conroy, CNM    Clinic [facility] Prenatal Labs  Dating  Blood type: --/--/O NEG (06/09 2308)   Genetic Screen 1 Screen:    AFP:     Quad:     NIPS: Antibody:NEG (06/09 2308)  Anatomic [place]  Rubella:   Varicella:    GTT Early:               Third trimester:  RPR: Non Reactive (06/09 2228)   Rhogam  HBsAg:     TDaP vaccine                       Flu Shot: HIV:     Baby Food                                AVW:[id number] (05/30 1534)  Contraception  Pap: 07/30/16, NIL  CBB     CS/VBAC    Support Person  Short interval between pregnancies affecting pregnancy, antepartum 01/19/2018 by Oswaldo ConroySchmid, Loretta Y, CNM No   History of cesarean delivery 12/04/2016 by Nadara MustardHarris, Robert P, MD No    Discussed that symptoms are likely caused by a viral gastrointestinal infection. Advised bland foods, rest, continuing good hydration. She will go to triage if symptoms get worse or she becomes unable to keep down food and drink.  Preterm labor symptoms and general obstetric precautions including but not limited to vaginal bleeding, contractions, leaking of fluid and fetal movement were reviewed.   Keep scheduled ROB.  Loretta BruinsJacelyn  Jensen, CNM 04/24/2018  11:39 AM

## 2018-04-30 ENCOUNTER — Encounter: Payer: Self-pay | Admitting: Maternal Newborn

## 2018-04-30 ENCOUNTER — Ambulatory Visit (INDEPENDENT_AMBULATORY_CARE_PROVIDER_SITE_OTHER): Payer: 59

## 2018-04-30 ENCOUNTER — Ambulatory Visit (INDEPENDENT_AMBULATORY_CARE_PROVIDER_SITE_OTHER): Payer: 59 | Admitting: Maternal Newborn

## 2018-04-30 ENCOUNTER — Ambulatory Visit: Payer: 59

## 2018-04-30 VITALS — BP 90/60 | Wt 189.5 lb

## 2018-04-30 DIAGNOSIS — Z362 Encounter for other antenatal screening follow-up: Secondary | ICD-10-CM

## 2018-04-30 DIAGNOSIS — O34219 Maternal care for unspecified type scar from previous cesarean delivery: Secondary | ICD-10-CM

## 2018-04-30 DIAGNOSIS — O358XX0 Maternal care for other (suspected) fetal abnormality and damage, not applicable or unspecified: Secondary | ICD-10-CM

## 2018-04-30 DIAGNOSIS — O360130 Maternal care for anti-D [Rh] antibodies, third trimester, not applicable or unspecified: Secondary | ICD-10-CM | POA: Diagnosis not present

## 2018-04-30 DIAGNOSIS — Z3A25 25 weeks gestation of pregnancy: Secondary | ICD-10-CM | POA: Diagnosis not present

## 2018-04-30 DIAGNOSIS — Z3A28 28 weeks gestation of pregnancy: Secondary | ICD-10-CM

## 2018-04-30 DIAGNOSIS — Z7689 Persons encountering health services in other specified circumstances: Secondary | ICD-10-CM | POA: Diagnosis not present

## 2018-04-30 DIAGNOSIS — Z6834 Body mass index (BMI) 34.0-34.9, adult: Secondary | ICD-10-CM

## 2018-04-30 DIAGNOSIS — Z6791 Unspecified blood type, Rh negative: Secondary | ICD-10-CM

## 2018-04-30 DIAGNOSIS — O0990 Supervision of high risk pregnancy, unspecified, unspecified trimester: Secondary | ICD-10-CM

## 2018-04-30 DIAGNOSIS — O26899 Other specified pregnancy related conditions, unspecified trimester: Secondary | ICD-10-CM

## 2018-04-30 DIAGNOSIS — O99213 Obesity complicating pregnancy, third trimester: Secondary | ICD-10-CM

## 2018-04-30 LAB — POCT URINALYSIS DIPSTICK OB
Glucose, UA: NEGATIVE — AB
PROTEIN: NEGATIVE

## 2018-04-30 MED ORDER — RHO D IMMUNE GLOBULIN 1500 UNIT/2ML IJ SOSY
300.0000 ug | PREFILLED_SYRINGE | Freq: Once | INTRAMUSCULAR | Status: AC
  Administered 2018-04-30: 300 ug via INTRAMUSCULAR

## 2018-04-30 NOTE — Patient Instructions (Signed)
Third Trimester of Pregnancy The third trimester is from week 28 through week 40 (months 7 through 9). The third trimester is a time when the unborn baby (fetus) is growing rapidly. At the end of the ninth month, the fetus is about 20 inches in length and weighs 6-10 pounds. Body changes during your third trimester Your body will continue to go through many changes during pregnancy. The changes vary from woman to woman. During the third trimester:  Your weight will continue to increase. You can expect to gain 25-35 pounds (11-16 kg) by the end of the pregnancy.  You may begin to get stretch marks on your hips, abdomen, and breasts.  You may urinate more often because the fetus is moving lower into your pelvis and pressing on your bladder.  You may develop or continue to have heartburn. This is caused by increased hormones that slow down muscles in the digestive tract.  You may develop or continue to have constipation because increased hormones slow digestion and cause the muscles that push waste through your intestines to relax.  You may develop hemorrhoids. These are swollen veins (varicose veins) in the rectum that can itch or be painful.  You may develop swollen, bulging veins (varicose veins) in your legs.  You may have increased body aches in the pelvis, back, or thighs. This is due to weight gain and increased hormones that are relaxing your joints.  You may have changes in your hair. These can include thickening of your hair, rapid growth, and changes in texture. Some women also have hair loss during or after pregnancy, or hair that feels dry or thin. Your hair will most likely return to normal after your baby is born.  Your breasts will continue to grow and they will continue to become tender. A yellow fluid (colostrum) may leak from your breasts. This is the first milk you are producing for your baby.  Your belly button may stick out.  You may notice more swelling in your hands,  face, or ankles.  You may have increased tingling or numbness in your hands, arms, and legs. The skin on your belly may also feel numb.  You may feel short of breath because of your expanding uterus.  You may have more problems sleeping. This can be caused by the size of your belly, increased need to urinate, and an increase in your body's metabolism.  You may notice the fetus "dropping," or moving lower in your abdomen (lightening).  You may have increased vaginal discharge.  You may notice your joints feel loose and you may have pain around your pelvic bone.  What to expect at prenatal visits You will have prenatal exams every 2 weeks until week 36. Then you will have weekly prenatal exams. During a routine prenatal visit:  You will be weighed to make sure you and the baby are growing normally.  Your blood pressure will be taken.  Your abdomen will be measured to track your baby's growth.  The fetal heartbeat will be listened to.  Any test results from the previous visit will be discussed.  You may have a cervical check near your due date to see if your cervix has softened or thinned (effaced).  You will be tested for Group B streptococcus. This happens between 35 and 37 weeks.  Your health care provider may ask you:  What your birth plan is.  How you are feeling.  If you are feeling the baby move.  If you have had   any abnormal symptoms, such as leaking fluid, bleeding, severe headaches, or abdominal cramping.  If you are using any tobacco products, including cigarettes, chewing tobacco, and electronic cigarettes.  If you have any questions.  Other tests or screenings that may be performed during your third trimester include:  Blood tests that check for low iron levels (anemia).  Fetal testing to check the health, activity level, and growth of the fetus. Testing is done if you have certain medical conditions or if there are problems during the  pregnancy.  Nonstress test (NST). This test checks the health of your baby to make sure there are no signs of problems, such as the baby not getting enough oxygen. During this test, a belt is placed around your belly. The baby is made to move, and its heart rate is monitored during movement.  What is false labor? False labor is a condition in which you feel small, irregular tightenings of the muscles in the womb (contractions) that usually go away with rest, changing position, or drinking water. These are called Braxton Hicks contractions. Contractions may last for hours, days, or even weeks before true labor sets in. If contractions come at regular intervals, become more frequent, increase in intensity, or become painful, you should see your health care provider. What are the signs of labor?  Abdominal cramps.  Regular contractions that start at 10 minutes apart and become stronger and more frequent with time.  Contractions that start on the top of the uterus and spread down to the lower abdomen and back.  Increased pelvic pressure and dull back pain.  A watery or bloody mucus discharge that comes from the vagina.  Leaking of amniotic fluid. This is also known as your "water breaking." It could be a slow trickle or a gush. Let your health care provider know if it has a color or strange odor. If you have any of these signs, call your health care provider right away, even if it is before your due date. Follow these instructions at home: Medicines  Follow your health care provider's instructions regarding medicine use. Specific medicines may be either safe or unsafe to take during pregnancy.  Take a prenatal vitamin that contains at least 600 micrograms (mcg) of folic acid.  If you develop constipation, try taking a stool softener if your health care provider approves. Eating and drinking  Eat a balanced diet that includes fresh fruits and vegetables, whole grains, good sources of protein  such as meat, eggs, or tofu, and low-fat dairy. Your health care provider will help you determine the amount of weight gain that is right for you.  Avoid raw meat and uncooked cheese. These carry germs that can cause birth defects in the baby.  If you have low calcium intake from food, talk to your health care provider about whether you should take a daily calcium supplement.  Eat four or five small meals rather than three large meals a day.  Limit foods that are high in fat and processed sugars, such as fried and sweet foods.  To prevent constipation: ? Drink enough fluid to keep your urine clear or pale yellow. ? Eat foods that are high in fiber, such as fresh fruits and vegetables, whole grains, and beans. Activity  Exercise only as directed by your health care provider. Most women can continue their usual exercise routine during pregnancy. Try to exercise for 30 minutes at least 5 days a week. Stop exercising if you experience uterine contractions.  Avoid heavy   lifting.  Do not exercise in extreme heat or humidity, or at high altitudes.  Wear low-heel, comfortable shoes.  Practice good posture.  You may continue to have sex unless your health care provider tells you otherwise. Relieving pain and discomfort  Take frequent breaks and rest with your legs elevated if you have leg cramps or low back pain.  Take warm sitz baths to soothe any pain or discomfort caused by hemorrhoids. Use hemorrhoid cream if your health care provider approves.  Wear a good support bra to prevent discomfort from breast tenderness.  If you develop varicose veins: ? Wear support pantyhose or compression stockings as told by your healthcare provider. ? Elevate your feet for 15 minutes, 3-4 times a day. Prenatal care  Write down your questions. Take them to your prenatal visits.  Keep all your prenatal visits as told by your health care provider. This is important. Safety  Wear your seat belt at  all times when driving.  Make a list of emergency phone numbers, including numbers for family, friends, the hospital, and police and fire departments. General instructions  Avoid cat litter boxes and soil used by cats. These carry germs that can cause birth defects in the baby. If you have a cat, ask someone to clean the litter box for you.  Do not travel far distances unless it is absolutely necessary and only with the approval of your health care provider.  Do not use hot tubs, steam rooms, or saunas.  Do not drink alcohol.  Do not use any products that contain nicotine or tobacco, such as cigarettes and e-cigarettes. If you need help quitting, ask your health care provider.  Do not use any medicinal herbs or unprescribed drugs. These chemicals affect the formation and growth of the baby.  Do not douche or use tampons or scented sanitary pads.  Do not cross your legs for long periods of time.  To prepare for the arrival of your baby: ? Take prenatal classes to understand, practice, and ask questions about labor and delivery. ? Make a trial run to the hospital. ? Visit the hospital and tour the maternity area. ? Arrange for maternity or paternity leave through employers. ? Arrange for family and friends to take care of pets while you are in the hospital. ? Purchase a rear-facing car seat and make sure you know how to install it in your car. ? Pack your hospital bag. ? Prepare the baby's nursery. Make sure to remove all pillows and stuffed animals from the baby's crib to prevent suffocation.  Visit your dentist if you have not gone during your pregnancy. Use a soft toothbrush to brush your teeth and be gentle when you floss. Contact a health care provider if:  You are unsure if you are in labor or if your water has broken.  You become dizzy.  You have mild pelvic cramps, pelvic pressure, or nagging pain in your abdominal area.  You have lower back pain.  You have persistent  nausea, vomiting, or diarrhea.  You have an unusual or bad smelling vaginal discharge.  You have pain when you urinate. Get help right away if:  Your water breaks before 37 weeks.  You have regular contractions less than 5 minutes apart before 37 weeks.  You have a fever.  You are leaking fluid from your vagina.  You have spotting or bleeding from your vagina.  You have severe abdominal pain or cramping.  You have rapid weight loss or weight gain.    You have shortness of breath with chest pain.  You notice sudden or extreme swelling of your face, hands, ankles, feet, or legs.  Your baby makes fewer than 10 movements in 2 hours.  You have severe headaches that do not go away when you take medicine.  You have vision changes. Summary  The third trimester is from week 28 through week 40, months 7 through 9. The third trimester is a time when the unborn baby (fetus) is growing rapidly.  During the third trimester, your discomfort may increase as you and your baby continue to gain weight. You may have abdominal, leg, and back pain, sleeping problems, and an increased need to urinate.  During the third trimester your breasts will keep growing and they will continue to become tender. A yellow fluid (colostrum) may leak from your breasts. This is the first milk you are producing for your baby.  False labor is a condition in which you feel small, irregular tightenings of the muscles in the womb (contractions) that eventually go away. These are called Braxton Hicks contractions. Contractions may last for hours, days, or even weeks before true labor sets in.  Signs of labor can include: abdominal cramps; regular contractions that start at 10 minutes apart and become stronger and more frequent with time; watery or bloody mucus discharge that comes from the vagina; increased pelvic pressure and dull back pain; and leaking of amniotic fluid. This information is not intended to replace advice  given to you by your health care provider. Make sure you discuss any questions you have with your health care provider. Document Released: 09/03/2001 Document Revised: 02/15/2016 Document Reviewed: 11/10/2012 Elsevier Interactive Patient Education  2017 [employer]  

## 2018-04-30 NOTE — Progress Notes (Signed)
C/o [email or web address]

## 2018-04-30 NOTE — Progress Notes (Signed)
Routine Prenatal Care Visit  Subjective  Loretta Jensen is a 32 y.o. G3P2002 at 4953w6d being seen today for ongoing prenatal care.  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  ----------------------------------------------------------------------------------- Patient reports loose stools every few days. Some normal stools in between. No other GI symptoms. Contractions: Not present. Vag. Bleeding: None.  Movement: Present. No leaking of fluid.  ----------------------------------------------------------------------------------- The following portions of the patient's history were reviewed and updated as appropriate: allergies, current medications, past family history, past medical history, past social history, past surgical history and problem list. Problem list updated.  Objective  Blood pressure 90/60, weight 189 lb 8 oz (86 kg), last menstrual period 11/05/2017. Body mass index is 32.53 kg/m. Pregravid weight 199 lb (90.3 kg) Total Weight Gain -9 lb 8 oz (-4.309 kg) Urinalysis: Protein Negative; Glucose Negative Fetal Status: Fetal Heart Rate (bpm): 127   Movement: Present     General:  Alert, oriented and cooperative. Patient is in no acute distress.  Skin: Skin is warm and dry. No rash noted.   Cardiovascular: Normal heart rate noted  Respiratory: Normal respiratory effort, no problems with respiration noted  Abdomen: Soft, gravid, appropriate for gestational age. Pain/Pressure: Absent     Pelvic:  Cervical  exam deferred        Extremities: Normal range of motion.     Mental Status: Normal mood and affect. Normal behavior. Normal judgment and thought content.    Assessment   32 y.o. [id number] at 1653w6d, EDD 07/17/2018 by Ultrasound presenting for routine prenatal visit.  Plan   THIRD Problems (from 01/19/18 to present)    Problem Noted Resolved   Obesity affecting pregnancy 03/04/2018 by Conard NovakJackson, Stephen D, MD No   BMI 34.0-34.9,adult 03/04/2018 by Conard NovakJackson, Stephen D, MD No   Migraine headache 01/23/2018 by Oswaldo ConroySchmid, Leone Mobley Y, CNM No   Supervision of high risk pregnancy, antepartum 01/19/2018 by Oswaldo ConroySchmid, Eldred Lievanos Y, CNM No   Overview Addendum 01/19/2018  2:35 PM by Oswaldo ConroySchmid, Shavone Nevers Y, CNM    Clinic [facility] Prenatal Labs  Dating  Blood type: --/--/O NEG (06/09 2308)   Genetic Screen 1 Screen:    AFP:     Quad:     NIPS: Antibody:NEG (06/09 2308)  Anatomic US  Rubella:   Varicella:    GTT Early:               Third trimester:  RPR: Non Reactive (06/09 2228)   Rhogam  HBsAg:     TDaP vaccine                       Flu Shot: HIV:     Baby Food                                NGE:[id number]:Negative (05/30 1534)  Contraception  Pap: 07/30/16, NIL  CBB     CS/VBAC    Support Person              Short interval between pregnancies affecting pregnancy, antepartum 01/19/2018 by Oswaldo ConroySchmid, Yukari Flax Y, CNM  No   History of cesarean delivery 12/04/2016 by Nadara Mustard, MD No    Ultrasound today shows left kidney pyelectasis 8.9 mm, right kidney pelvis 3.9 mm. Discussed with MD; follow-up postpartum with pediatric kidney ultrasound.  RhoGAM given today.   Please refer to After Visit Summary for other counseling recommendations.   Return in about 2 weeks (around 05/14/2018) for ROB.  Marcelyn Bruins, CNM 04/30/2018  11:21 AM

## 2018-05-01 ENCOUNTER — Other Ambulatory Visit: Payer: 59

## 2018-05-01 ENCOUNTER — Encounter: Payer: 59 | Admitting: Advanced Practice Midwife

## 2018-05-01 LAB — 28 WEEKS RH-PANEL
ANTIBODY SCREEN: NEGATIVE
BASOS ABS: 0 10*3/uL (ref 0.0–0.2)
Basos: 0 %
EOS (ABSOLUTE): 0.1 10*3/uL (ref 0.0–0.4)
EOS: 1 %
Gestational Diabetes Screen: 119 mg/dL (ref 65–139)
HIV SCREEN 4TH GENERATION: NONREACTIVE
Hematocrit: 32.5 % — ABNORMAL LOW (ref 34.0–46.6)
Hemoglobin: 10.8 g/dL — ABNORMAL LOW (ref 11.1–15.9)
IMMATURE GRANS (ABS): 0.1 10*3/uL (ref 0.0–0.1)
Immature Granulocytes: 1 %
LYMPHS: 19 %
Lymphocytes Absolute: 1.7 10*3/uL (ref 0.7–3.1)
MCH: 28 pg (ref 26.6–33.0)
MCHC: 33.2 g/dL (ref 31.5–35.7)
MCV: 84 fL (ref 79–97)
Monocytes Absolute: 0.5 10*3/uL (ref 0.1–0.9)
Monocytes: 5 %
Neutrophils Absolute: 6.6 10*3/uL (ref 1.4–7.0)
Neutrophils: 74 %
PLATELETS: 252 10*3/uL (ref 150–450)
RBC: 3.86 x10E6/uL (ref 3.77–5.28)
RDW: 13.9 % (ref 12.3–15.4)
RPR Ser Ql: NONREACTIVE
WBC: 8.9 10*3/uL (ref 3.4–10.8)

## 2018-05-14 ENCOUNTER — Encounter: Payer: Self-pay | Admitting: Obstetrics and Gynecology

## 2018-05-14 ENCOUNTER — Ambulatory Visit (INDEPENDENT_AMBULATORY_CARE_PROVIDER_SITE_OTHER): Payer: 59 | Admitting: Obstetrics and Gynecology

## 2018-05-14 VITALS — BP 100/60 | Wt 193.0 lb

## 2018-05-14 DIAGNOSIS — Z98891 History of uterine scar from previous surgery: Secondary | ICD-10-CM

## 2018-05-14 DIAGNOSIS — O34219 Maternal care for unspecified type scar from previous cesarean delivery: Secondary | ICD-10-CM

## 2018-05-14 DIAGNOSIS — O99213 Obesity complicating pregnancy, third trimester: Secondary | ICD-10-CM

## 2018-05-14 DIAGNOSIS — Z6834 Body mass index (BMI) 34.0-34.9, adult: Secondary | ICD-10-CM

## 2018-05-14 DIAGNOSIS — O09899 Supervision of other high risk pregnancies, unspecified trimester: Secondary | ICD-10-CM

## 2018-05-14 DIAGNOSIS — Z3A30 30 weeks gestation of pregnancy: Secondary | ICD-10-CM

## 2018-05-14 DIAGNOSIS — O0990 Supervision of high risk pregnancy, unspecified, unspecified trimester: Secondary | ICD-10-CM

## 2018-05-14 LAB — POCT URINALYSIS DIPSTICK OB
GLUCOSE, UA: NEGATIVE — AB
POC,PROTEIN,UA: NEGATIVE

## 2018-05-14 NOTE — Progress Notes (Signed)
Routine Prenatal Care Visit  Subjective  Loretta Jensen is a 32 y.o. G3P2002 at 5147w6d being seen today for ongoing prenatal care.  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  ----------------------------------------------------------------------------------- Patient reports pain across her upper abdomen that radiates to her lower back. Sometimes the pain starts in her lower back and radiates around to her upper abdomen. She denies fevers, chills, hematuria. She does associate nausea. Working makes the pain worse. Taking it easy makes the pain better. Denies skin changes. Denies constipation. Denies vaginal bleeding and contractions. 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   Contractions: Not present. Vag. Bleeding: None.  Movement: Present. Denies leaking of fluid.  ----------------------------------------------------------------------------------- The following portions of the patient's history were reviewed and updated as appropriate: allergies, current medications, past family history, past medical history, past social history, past surgical history and problem list. Problem list updated.   Objective  Blood pressure 100/60, weight 193 lb (87.5 kg), last menstrual period 11/05/2017, not currently breastfeeding. Pregravid weight 199 lb (90.3 kg) Total Weight Gain -6 lb (-2.722 kg) Urinalysis:       Fetal Status: Fetal Heart Rate (bpm): 140 Fundal Height: 30 cm Movement: Present     General:  Alert, oriented and cooperative. Patient is in no acute distress.  Skin: Skin is warm and dry. No rash noted.   Cardiovascular: Normal heart rate noted  Respiratory: Normal respiratory effort, no problems with respiration noted  Abdomen: Soft, gravid, appropriate for gestational age. Pain/Pressure: Present     Pelvic:  Cervical exam deferred        Extremities: Normal range of motion.     Mental Status: Normal mood and affect. Normal behavior. Normal judgment and thought content.   Assessment   31 y.o. [id number] at 5247w6d by  07/17/2018, by Ultrasound presenting for routine prenatal visit  Plan   THIRD Problems (from 01/19/18 to present)    Problem Noted Resolved   Obesity affecting pregnancy 03/04/2018 by Conard NovakJackson, Cozy Veale D, MD No   BMI 34.0-34.9,adult 03/04/2018 by Conard NovakJackson, Abbe Bula D, MD No   Migraine headache 01/23/2018 by Oswaldo ConroySchmid, Jacelyn Y, CNM No   Supervision of high risk pregnancy, antepartum 01/19/2018 by Oswaldo ConroySchmid, Jacelyn Y, CNM No   Overview Addendum 04/30/2018 11:06 AM by Oswaldo ConroySchmid, Jacelyn Y, CNM    [facility]inic [facility] Prenatal Labs  Dating  Blood type: O/Negative/-- (04/30 1053)   Genetic Screen 1 Screen:    AFP:     Quad:     NIPS: Antibody:Negative (04/30 1053)  Anatomic US Pyelectasis left kidney Rubella: 3.62 (04/30 1053) Varicella: Immune  GTT Early:               Third trimester:  RPR: Non Reactive (04/30 1053)   Rhogam Given 04/30/18 HBsAg: Negative (04/30 1053)   TDaP vaccine                       Flu Shot: HIV: Non Reactive (04/30 1053)   Baby Food  GBS:   Contraception  Pap: 07/30/16, NIL  CBB     CS/VBAC    Support Person              Short interval between pregnancies affecting pregnancy, antepartum 01/19/2018 by Oswaldo Conroy, CNM No   History of cesarean delivery 12/04/2016 by Nadara Mustard, MD No       Preterm labor symptoms and  general obstetric precautions including but not limited to vaginal bleeding, contractions, leaking of fluid and fetal movement were reviewed in detail with the patient. Please refer to After Visit Summary for other counseling recommendations.   - Work note provided for 10 hour work days to see if this improves her abdominal pain. - repeat c-section scheduled for 10/18 at 39 weeks.  Return in about 2 weeks (around 05/28/2018) for Routine Prenatal Appointment.  Thomasene Mohair, MD, [facility], [facility] 05/14/2018 5:14 PM

## 2018-05-15 ENCOUNTER — Telehealth: Payer: Self-pay | Admitting: Obstetrics and Gynecology

## 2018-05-15 NOTE — Telephone Encounter (Signed)
[facility] @ 10:10am w/ Dr. Jean RosenthalJackson, Pre-admit Testing afterwards, and OR on 07/10/18.

## 2018-05-15 NOTE — Telephone Encounter (Signed)
-----   Message from Conard NovakStephen D Jackson, MD sent at 05/14/2018  4:46 PM EDT ----- Regarding: Schedule surgery Surgery Booking Request Patient Full Name:  Loretta Jensen  MRN: [id number]  DOB: Mar 13, 1986  Surgeon: Thomasene MohairStephen Jackson, MD  Requested Surgery Date and Time: 07/10/18 Primary Diagnosis AND Code: history of previous cesarean, desires repeat Secondary Diagnosis and Code:  Surgical Procedure: Cesarean Section L&D Notification: Yes Admission Status: surgery admit Length of Surgery: 60 min Special Case Needs: no H&P: tbd (date) Phone Interview???: no Interpreter: Language:  Medical Clearance: no Special Scheduling Instructions: none

## 2018-05-15 NOTE — Telephone Encounter (Signed)
Lmtrc

## 2018-05-28 ENCOUNTER — Encounter: Payer: Self-pay | Admitting: Advanced Practice Midwife

## 2018-05-28 ENCOUNTER — Ambulatory Visit (INDEPENDENT_AMBULATORY_CARE_PROVIDER_SITE_OTHER): Payer: 59 | Admitting: Advanced Practice Midwife

## 2018-05-28 VITALS — BP 108/62 | Wt 190.0 lb

## 2018-05-28 DIAGNOSIS — Z3A32 32 weeks gestation of pregnancy: Secondary | ICD-10-CM

## 2018-05-28 LAB — POCT URINALYSIS DIPSTICK OB
Glucose, UA: NEGATIVE
POC,PROTEIN,UA: NEGATIVE

## 2018-05-28 NOTE — Progress Notes (Signed)
ROB Vaginal pressure/ spotting yesterday

## 2018-05-28 NOTE — Progress Notes (Signed)
Routine Prenatal Care Visit  Subjective  Loretta Jensen is a 32 y.o. G3P2002 at [redacted]w[redacted]d being seen today for ongoing prenatal care.  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  ----------------------------------------------------------------------------------- Patient reports braxton hicks contractions, spotting at work yesterday. She noticed bright red blood when wiping one time and later had a spot of pink when wiping. She has not had any further bleeding.   Contractions: Not present. Vag. Bleeding: Scant.  Movement: Present. Denies leaking of fluid.  ----------------------------------------------------------------------------------- The following portions of the patient's history were reviewed and updated as appropriate: allergies, current medications, past family history, past medical history, past social history, past surgical history and problem list. Problem list updated.   Objective  Blood pressure 108/62, weight 190 lb (86.2 kg), last menstrual period 11/05/2017 Pregravid weight 199 lb (90.3 kg) Total Weight Gain -9 lb (-4.082 kg) Urinalysis: Urine Protein Negative  Urine Glucose Negative  Fetal Status: Fetal Heart Rate (bpm): 136 Fundal Height: 33 cm Movement: Present     General:  Alert, oriented and cooperative. Patient is in no acute distress.  Skin: Skin is warm and dry. No rash noted.   Cardiovascular: Normal heart rate noted  Respiratory: Normal respiratory effort, no problems with respiration noted    Abdomen: Soft, gravid, appropriate for gestational age. Pain/Pressure: Present     Pelvic:  Cervical exam deferred        Extremities: Normal range of motion.     Mental Status: Normal mood and affect. Normal behavior. Normal judgment and thought content.   Assessment   31 y.o. [id number] at [redacted]w[redacted]d by  07/17/2018, by Ultrasound presenting for routine prenatal visit  Plan   THIRD Problems (from 01/19/18 to present)    Problem Noted Resolved   Obesity affecting pregnancy 03/04/2018 by Conard Novak, MD No   BMI 34.0-34.9,adult 03/04/2018 by Conard Novak, MD No   Migraine headache 01/23/2018 by Oswaldo Conroy, CNM No   Supervision of high risk pregnancy, antepartum 01/19/2018 by Oswaldo Conroy, CNM No   Overview Addendum 04/30/2018 11:06 AM by Oswaldo Conroy, CNM    [facility] Prenatal Labs  Dating  Blood type: O/Negative/-- (04/30 1053)   Genetic Screen 1 Screen:    AFP:     Quad:     NIPS: Antibody:Negative (04/30 1053)  Anatomic [place] Pyelectasis left kidney Rubella: 3.62 (04/30 1053) Varicella: Immune  GTT Early:               Third trimester:  RPR: Non Reactive (04/30 1053)   Rhogam Given 04/30/18 HBsAg: Negative (04/30 1053)   TDaP vaccine                       Flu Shot: HIV: Non Reactive (04/30 1053)   Baby Food                                GBS:   Contraception  Pap: 07/30/16, NIL  CBB     CS/VBAC    Support Person  Short interval between pregnancies affecting pregnancy, antepartum 01/19/2018 by Oswaldo Conroy, CNM No   History of cesarean delivery 12/04/2016 by Nadara Mustard, MD No       Preterm labor symptoms and general obstetric precautions including but not limited to vaginal bleeding, contractions, leaking of fluid and fetal movement were reviewed in detail with the patient.   Return in about 2 weeks (around 06/11/2018) for rob.  Tresea Mall, CNM 05/28/2018 8:41 AM

## 2018-06-12 ENCOUNTER — Encounter: Payer: 59 | Admitting: Advanced Practice Midwife

## 2018-06-12 ENCOUNTER — Ambulatory Visit (INDEPENDENT_AMBULATORY_CARE_PROVIDER_SITE_OTHER): Payer: 59 | Admitting: Advanced Practice Midwife

## 2018-06-12 ENCOUNTER — Encounter: Payer: Self-pay | Admitting: Advanced Practice Midwife

## 2018-06-12 VITALS — BP 112/60 | Wt 189.0 lb

## 2018-06-12 DIAGNOSIS — O34219 Maternal care for unspecified type scar from previous cesarean delivery: Secondary | ICD-10-CM

## 2018-06-12 DIAGNOSIS — Z3A35 35 weeks gestation of pregnancy: Secondary | ICD-10-CM

## 2018-06-12 LAB — POCT URINALYSIS DIPSTICK OB
GLUCOSE, UA: NEGATIVE
POC,PROTEIN,UA: NEGATIVE

## 2018-06-12 NOTE — Progress Notes (Signed)
ROB-since Monday has been having contractions/pressure

## 2018-06-12 NOTE — Progress Notes (Signed)
Routine Prenatal Care Visit  Subjective  Loretta Jensen is a 32 y.o. G3P2002 at [redacted]w[redacted]d being seen today for ongoing prenatal care.  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  ----------------------------------------------------------------------------------- Patient reports pelvic pain that occurs hourly and lasts for 5-10 minutes. She also has back pain. Recommend abdominal support band/hands and knees.   Contractions: Not present. Vag. Bleeding: None.  Movement: Present. Denies leaking of fluid.  ----------------------------------------------------------------------------------- The following portions of the patient's history were reviewed and updated as appropriate: allergies, current medications, past family history, past medical history, past social history, past surgical history and problem list. Problem list updated.   Objective  Blood pressure 112/60, weight 189 lb (85.7 kg), last menstrual period 11/05/2017, not currently breastfeeding. Pregravid weight 199 lb (90.3 kg) Total Weight Gain -10 lb (-4.536 kg) Urinalysis: Urine Protein    Urine Glucose     Fetal Status: Fetal Heart Rate (bpm): 123 Fundal Height: 36 cm Movement: Present     General:  Alert, oriented and cooperative. Patient is in no acute distress.  Skin: Skin is warm and dry. No rash noted.   Cardiovascular: Normal heart rate noted  Respiratory: Normal respiratory effort, no problems with respiration noted  Abdomen: Soft, gravid, appropriate  for gestational age. Pain/Pressure: Present     Pelvic:  Cervical exam performed Dilation: Closed    posterior  Extremities: Normal range of motion.  Edema: None  Mental Status: Normal mood and affect. Normal behavior. Normal judgment and thought content.   Assessment   31 y.o. [id number] at [redacted]w[redacted]d by  07/17/2018, by Ultrasound presenting for routine prenatal visit  Plan   THIRD Problems (from 01/19/18 to present)    Problem Noted Resolved   Obesity affecting pregnancy 03/04/2018 by Conard Novak, MD No   BMI 34.0-34.9,adult 03/04/2018 by Conard Novak, MD No   Migraine headache 01/23/2018 by Oswaldo Conroy, CNM No   Supervision of high risk pregnancy, antepartum 01/19/2018 by Oswaldo Conroy, CNM No   Overview Addendum 04/30/2018 11:06 AM by Oswaldo Conroy, CNM    [facility]inic [facility] Prenatal Labs  Dating  Blood type: O/Negative/-- (04/30 1053)   Genetic Screen 1 Screen:    AFP:     Quad:     NIPS: Antibody:Negative (04/30 1053)  Anatomic [place] Pyelectasis left kidney Rubella: 3.62 (04/30 1053) Varicella: Immune  GTT Early:               Third trimester:  RPR: Non Reactive (04/30 1053)   Rhogam Given 04/30/18 HBsAg: Negative (04/30 1053)   TDaP vaccine                       Flu Shot: HIV: Non Reactive (04/30 1053)   Baby Food                                GBS:   Contraception  Pap: 07/30/16, NIL  CBB     CS/VBAC    Support Person  Short interval between pregnancies affecting pregnancy, antepartum 01/19/2018 by Oswaldo ConroySchmid, Jacelyn Y, CNM No   History of cesarean delivery 12/04/2016 by Nadara MustardHarris, Robert P, MD No       Preterm labor symptoms and general obstetric precautions including but not limited to vaginal bleeding, contractions, leaking of fluid and fetal movement were reviewed in detail with the patient.   Return in about 1 week (around 06/19/2018) for rob.  Tresea MallJane Amier Hoyt, CNM 06/12/2018 11:16 AM

## 2018-06-17 ENCOUNTER — Ambulatory Visit (INDEPENDENT_AMBULATORY_CARE_PROVIDER_SITE_OTHER): Payer: 59 | Admitting: Maternal Newborn

## 2018-06-17 ENCOUNTER — Telehealth: Payer: Self-pay

## 2018-06-17 VITALS — BP 96/58 | Wt 191.0 lb

## 2018-06-17 DIAGNOSIS — O219 Vomiting of pregnancy, unspecified: Secondary | ICD-10-CM

## 2018-06-17 DIAGNOSIS — O0990 Supervision of high risk pregnancy, unspecified, unspecified trimester: Secondary | ICD-10-CM

## 2018-06-17 DIAGNOSIS — Z3A35 35 weeks gestation of pregnancy: Secondary | ICD-10-CM

## 2018-06-17 DIAGNOSIS — R82998 Other abnormal findings in urine: Secondary | ICD-10-CM | POA: Diagnosis not present

## 2018-06-17 DIAGNOSIS — Z23 Encounter for immunization: Secondary | ICD-10-CM | POA: Diagnosis not present

## 2018-06-17 DIAGNOSIS — O9989 Other specified diseases and conditions complicating pregnancy, childbirth and the puerperium: Secondary | ICD-10-CM

## 2018-06-17 LAB — POCT URINALYSIS DIPSTICK
Bilirubin, UA: NEGATIVE
Blood, UA: NEGATIVE
GLUCOSE UA: NEGATIVE
Ketones, UA: NEGATIVE
NITRITE UA: NEGATIVE
PH UA: 7.5 (ref 5.0–8.0)
Protein, UA: POSITIVE — AB
SPEC GRAV UA: 1.025 (ref 1.010–1.025)
UROBILINOGEN UA: NEGATIVE U/dL — AB

## 2018-06-17 MED ORDER — ONDANSETRON 4 MG PO TBDP: 4.0000 mg | ORAL_TABLET | Freq: Four times a day (QID) | ORAL | 0 refills | Status: DC | PRN

## 2018-06-17 NOTE — Telephone Encounter (Signed)
Patient is returning missed call. Please advise 

## 2018-06-17 NOTE — Progress Notes (Signed)
OB work-in  Throwing up/diarrhea/cramping Dizziness/lightheaded

## 2018-06-17 NOTE — Telephone Encounter (Signed)
Spoke w/pt. She states she received a note for light duty work today. Her job will not allow her to work light duty. She can only work full time or be out completely. [id number]#[phone]#[phone]. She goes in at 9am. Her boyfriend Jill Alexanders(Justin) will answer the phone. She gives us permission to give him the information/speak to him.

## 2018-06-17 NOTE — Telephone Encounter (Signed)
Pt states she was seen today & requests a call back tomorrow morning if possible...Marland Kitchen.Marland Kitchen.Marland Kitchen. Call disconnected no other details available.

## 2018-06-17 NOTE — Telephone Encounter (Signed)
LM w/Female for patient to return call.

## 2018-06-17 NOTE — Progress Notes (Signed)
Prenatal Problem Visit  Subjective  Loretta Jensen is a 32 y.o. G3P2002 at [redacted]w[redacted]d being seen today for ongoing prenatal care.  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  ----------------------------------------------------------------------------------- Patient reports nausea, vomiting, diarrhea, and abdominal cramping over the last couple of days. She felt very dizzy today and had the feeling that she might pass out. She has not kept down much in the way of solid food or liquids during this time. ----------------------------------------------------------------------------------- The following portions of the patient's history were reviewed and updated as appropriate: allergies, current medications, past family history, past medical history, past social history, past surgical history and problem list. Problem list updated.  Objective  Blood pressure (!) 96/58, weight 191 lb (86.6 kg), last menstrual period 11/05/2017, not currently breastfeeding. Pregravid weight 199 lb (90.3 kg) Total Weight Gain -8 lb (-3.629 kg) Urinalysis: 2+ leukocytes, positive protein  General:  Alert, oriented and cooperative. Patient is in no acute distress.  Skin: Skin is warm and dry. No rash noted.   Cardiovascular: Normal heart rate noted  Respiratory: Normal respiratory effort, no problems with respiration noted  Abdomen: Soft, gravid, appropriate for gestational age.       Pelvic:  Cervical exam deferred        Extremities: Normal  range of motion.     Mental Status: Normal mood and affect. Normal behavior. Normal judgment and thought content.     Assessment   31 y.o. [id number] at 100w5d, EDD 07/17/2018 by Ultrasound presenting for a prenatal problem visit.  Plan   THIRD Problems (from 01/19/18 to present)    Problem Noted Resolved   Obesity affecting pregnancy 03/04/2018 by Conard Novak, MD No   BMI 34.0-34.9,adult 03/04/2018 by Conard Novak, MD No   Migraine headache 01/23/2018 by Oswaldo Conroy, CNM No   Supervision of high risk pregnancy, antepartum 01/19/2018 by Oswaldo Conroy, CNM No   Overview Addendum 04/30/2018 11:06 AM by Oswaldo Conroy, CNM    [facility]inic [facility] Prenatal Labs  Dating  Blood type: O/Negative/-- (04/30 1053)   Genetic Screen 1 Screen:    AFP:     Quad:     NIPS: Antibody:Negative (04/30 1053)  Anatomic [place] Pyelectasis left kidney Rubella: 3.62 (04/30 1053) Varicella: Immune  GTT Early:               Third trimester:  RPR: Non Reactive (04/30 1053)   Rhogam Given 04/30/18 HBsAg: Negative (04/30 1053)   TDaP vaccine                       Flu Shot: HIV: Non Reactive (04/30 1053)   Baby Food                                GBS:   Contraception  Pap: 07/30/16, NIL  CBB     CS/VBAC    Support Person              Short interval between pregnancies affecting pregnancy, antepartum 01/19/2018 by Oswaldo Conroy, CNM No  History of cesarean delivery 12/04/2016 by Nadara Mustard, MD No    Sent Rx for Zofran for relief of nausea. Advised good oral hydration with fluids containing electrolytes such as Gatorade. Encouraged her to go to triage for IV hydration if she is unable to keep liquids down.    Urine culture sent to rule out UTI, currently asymptomatic.  Keep scheduled OB visit on 9/27.  Marcelyn Bruins, CNM 06/17/2018  1:56 PM

## 2018-06-18 ENCOUNTER — Encounter: Payer: Self-pay | Admitting: Maternal Newborn

## 2018-06-19 ENCOUNTER — Other Ambulatory Visit (HOSPITAL_COMMUNITY)
Admission: RE | Admit: 2018-06-19 | Discharge: 2018-06-19 | Disposition: A | Discharge status: Home / Self Care | Payer: 59 | Source: Ambulatory Visit | Attending: Advanced Practice Midwife | Admitting: Advanced Practice Midwife

## 2018-06-19 ENCOUNTER — Ambulatory Visit (INDEPENDENT_AMBULATORY_CARE_PROVIDER_SITE_OTHER): Payer: 59 | Admitting: Maternal Newborn

## 2018-06-19 ENCOUNTER — Encounter: Payer: 59 | Admitting: Advanced Practice Midwife

## 2018-06-19 VITALS — BP 110/64 | Wt 190.0 lb

## 2018-06-19 DIAGNOSIS — G43909 Migraine, unspecified, not intractable, without status migrainosus: Secondary | ICD-10-CM | POA: Insufficient documentation

## 2018-06-19 DIAGNOSIS — O99353 Diseases of the nervous system complicating pregnancy, third trimester: Secondary | ICD-10-CM | POA: Insufficient documentation

## 2018-06-19 DIAGNOSIS — O99213 Obesity complicating pregnancy, third trimester: Secondary | ICD-10-CM | POA: Diagnosis not present

## 2018-06-19 DIAGNOSIS — Z3A36 36 weeks gestation of pregnancy: Secondary | ICD-10-CM

## 2018-06-19 DIAGNOSIS — O0993 Supervision of high risk pregnancy, unspecified, third trimester: Secondary | ICD-10-CM | POA: Diagnosis not present

## 2018-06-19 DIAGNOSIS — O0990 Supervision of high risk pregnancy, unspecified, unspecified trimester: Secondary | ICD-10-CM

## 2018-06-19 DIAGNOSIS — Z113 Encounter for screening for infections with a predominantly sexual mode of transmission: Secondary | ICD-10-CM

## 2018-06-19 DIAGNOSIS — O34219 Maternal care for unspecified type scar from previous cesarean delivery: Secondary | ICD-10-CM

## 2018-06-19 LAB — POCT URINALYSIS DIPSTICK OB
Glucose, UA: NEGATIVE
PROTEIN: NEGATIVE

## 2018-06-19 LAB — URINE CULTURE

## 2018-06-19 NOTE — Progress Notes (Signed)
Routine Prenatal Care Visit  Subjective  Loretta Jensen is a 32 y.o. G3P2002 at [redacted]w[redacted]d being seen today for ongoing prenatal care.  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  ----------------------------------------------------------------------------------- Patient reports that she is feeling better since her last visit on 9/25  and nausea has ceased.  She is not able to get any accomodations at work to stop lifting heavy objects and take seated rest breaks, so she wishes to take FMLA. Contractions: Not present. Vag. Bleeding: None.  Movement: Present. No leaking of fluid.  ----------------------------------------------------------------------------------- The following portions of the patient's history were reviewed and updated as appropriate: allergies, current medications, past family history, past medical history, past social history, past surgical history and problem list. Problem list updated.   Objective  Blood pressure 110/64, weight 190 lb (86.2 kg), last menstrual period 11/05/2017, not currently breastfeeding. Pregravid weight 199 lb (90.3 kg) Total Weight Gain -9 lb (-4.082 kg) Body mass index is 32.61 kg/m.   Urinalysis: Protein Negative, Glucose Negative  Fetal Status: Fetal Heart Rate (bpm): 138 Fundal Height: 36 cm Movement: Present     General:  Alert, oriented and cooperative. Patient is in no acute distress.  Skin: Skin is warm and dry. No rash noted.   Cardiovascular: Normal heart rate  noted  Respiratory: Normal respiratory effort, no problems with respiration noted  Abdomen: Soft, gravid, appropriate for gestational age. Pain/Pressure: Present     Pelvic:  Cervical exam deferred        Extremities: Normal range of motion.     Mental Status: Normal mood and affect. Normal behavior. Normal judgment and thought content.     Assessment   31 y.o. [id number] at [redacted]w[redacted]d, EDD 07/17/2018 by Ultrasound presenting for a routine prenatal visit.  Plan   THIRD Problems (from 01/19/18 to present)    Problem Noted Resolved   Obesity affecting pregnancy 03/04/2018 by Conard Novak, MD No   BMI 34.0-34.9,adult 03/04/2018 by Conard Novak, MD No   Migraine headache 01/23/2018 by Oswaldo Conroy, CNM No   Supervision of high risk pregnancy, antepartum 01/19/2018 by Oswaldo Conroy, CNM No   Overview Addendum 04/30/2018 11:06 AM by Oswaldo Conroy, CNM    [facility]inic [facility] Prenatal Labs  Dating  Blood type: O/Negative/-- (04/30 1053)   Genetic Screen 1 Screen:    AFP:     Quad:     NIPS: Antibody:Negative (04/30 1053)  Anatomic [place] Pyelectasis left kidney Rubella: 3.62 (04/30 1053) Varicella: Immune  GTT Early:               Third trimester:  RPR: Non Reactive (04/30 1053)   Rhogam Given 04/30/18 HBsAg: Negative (04/30 1053)   TDaP vaccine                       Flu Shot: HIV: Non Reactive (04/30 1053)   Baby Food                                GBS:   Contraception  Pap: 07/30/16,  NIL  CBB     CS/VBAC    Support Person              Short interval between pregnancies affecting pregnancy, antepartum 01/19/2018 by Oswaldo Conroy, CNM No   History of cesarean delivery 12/04/2016 by Nadara Mustard, MD No    GBS and GC today. Note written for employer and will complete documentation for FMLA on request.  Preterm labor symptoms and general obstetric precautions  were reviewed..   Return in about 1 week (around 06/26/2018) for ROB.  Marcelyn Bruins, CNM 06/19/2018  9:40 AM

## 2018-06-19 NOTE — Progress Notes (Signed)
ROB/GBS- no concerns 

## 2018-06-21 LAB — STREP GP B NAA: Strep Gp B NAA: POSITIVE — AB

## 2018-06-22 LAB — CERVICOVAGINAL ANCILLARY ONLY
Chlamydia: NEGATIVE
Neisseria Gonorrhea: NEGATIVE

## 2018-06-23 ENCOUNTER — Encounter: Payer: Self-pay | Admitting: Maternal Newborn

## 2018-06-26 ENCOUNTER — Ambulatory Visit (INDEPENDENT_AMBULATORY_CARE_PROVIDER_SITE_OTHER): Payer: 59 | Admitting: Obstetrics & Gynecology

## 2018-06-26 VITALS — BP 100/70 | Wt 189.0 lb

## 2018-06-26 DIAGNOSIS — O34219 Maternal care for unspecified type scar from previous cesarean delivery: Secondary | ICD-10-CM

## 2018-06-26 DIAGNOSIS — Z3A37 37 weeks gestation of pregnancy: Secondary | ICD-10-CM

## 2018-06-26 DIAGNOSIS — Z98891 History of uterine scar from previous surgery: Secondary | ICD-10-CM

## 2018-06-26 LAB — POCT URINALYSIS DIPSTICK OB: Glucose, UA: NEGATIVE

## 2018-06-26 NOTE — Progress Notes (Signed)
  Subjective  Fetal Movement? yes Contractions? no Leaking Fluid? no Vaginal Bleeding? no  Objective  BP 100/70   Wt 189 lb (85.7 kg)   LMP 11/05/2017 (Approximate)   BMI 32.44 kg/m  General: NAD Pumonary: no increased work of breathing Abdomen: gravid, non-tender Extremities: no edema Psychiatric: mood appropriate, affect full  Assessment  31 y.o. [id number] at [redacted]w[redacted]d by  07/17/2018, by Ultrasound presenting for routine prenatal visit  Plan   Problem List Items Addressed This Visit      Other   History of cesarean delivery    Other Visit Diagnoses    [redacted] weeks gestation of pregnancy    -  Primary    Labor precuations discussed.  CS planned w BTL  Annamarie Major, MD, [facility], [facility] 06/26/2018  11:30 AM

## 2018-06-26 NOTE — Addendum Note (Signed)
Addended by: Cornelius Moras D on: 06/26/2018 02:49 PM   Modules accepted: Orders

## 2018-07-03 ENCOUNTER — Ambulatory Visit (INDEPENDENT_AMBULATORY_CARE_PROVIDER_SITE_OTHER): Payer: 59 | Admitting: Obstetrics and Gynecology

## 2018-07-03 ENCOUNTER — Encounter: Payer: Self-pay | Admitting: Obstetrics and Gynecology

## 2018-07-03 VITALS — BP 112/74 | Wt 190.0 lb

## 2018-07-03 DIAGNOSIS — O0990 Supervision of high risk pregnancy, unspecified, unspecified trimester: Secondary | ICD-10-CM

## 2018-07-03 DIAGNOSIS — O34219 Maternal care for unspecified type scar from previous cesarean delivery: Secondary | ICD-10-CM

## 2018-07-03 DIAGNOSIS — Z2821 Immunization not carried out because of patient refusal: Secondary | ICD-10-CM

## 2018-07-03 DIAGNOSIS — Z98891 History of uterine scar from previous surgery: Secondary | ICD-10-CM

## 2018-07-03 DIAGNOSIS — O99213 Obesity complicating pregnancy, third trimester: Secondary | ICD-10-CM

## 2018-07-03 DIAGNOSIS — Z3A38 38 weeks gestation of pregnancy: Secondary | ICD-10-CM

## 2018-07-03 DIAGNOSIS — Z6834 Body mass index (BMI) 34.0-34.9, adult: Secondary | ICD-10-CM

## 2018-07-03 NOTE — Progress Notes (Signed)
Routine Prenatal Care Visit  Subjective  Loretta Jensen is a 32 y.o. G3P2002 at [redacted]w[redacted]d being seen today for ongoing prenatal care.  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  ----------------------------------------------------------------------------------- Patient reports no complaints.   Contractions: Not present. Vag. Bleeding: None.  Movement: Present. Denies leaking of fluid.  ----------------------------------------------------------------------------------- The following portions of the patient's history were reviewed and updated as appropriate: allergies, current medications, past family history, past medical history, past social history, past surgical history and problem list. Problem list updated.   Objective  Blood pressure 112/74, weight 190 lb (86.2 kg), last menstrual period 11/05/2017, not currently breastfeeding. Pregravid weight 199 lb (90.3 kg) Total Weight Gain -9 lb (-4.082 kg) Urinalysis: Urine Protein    Urine Glucose    Fetal Status: Fetal Heart Rate (bpm): 135 Fundal Height: 36 cm Movement: Present     General:  Alert, oriented and cooperative. Patient is in no acute distress.  Skin: Skin is warm and dry. No rash noted.   Cardiovascular: Normal heart rate noted  Respiratory: Normal respiratory effort, no problems with respiration noted  Abdomen: Soft, gravid, appropriate for gestational age. Pain/Pressure: Present     Pelvic:  Cervical exam deferred        Extremities: Normal range of  motion.  Edema: None  Mental Status: Normal mood and affect. Normal behavior. Normal judgment and thought content.   Assessment   31 y.o. [id number] at [redacted]w[redacted]d by  07/17/2018, by Ultrasound presenting for routine prenatal visit  Plan   THIRD Problems (from 01/19/18 to present)    Problem Noted Resolved   Obesity affecting pregnancy 03/04/2018 by Conard Novak, MD No   BMI 34.0-34.9,adult 03/04/2018 by Conard Novak, MD No   Migraine headache 01/23/2018 by Oswaldo Conroy, CNM No   Supervision of high risk pregnancy, antepartum 01/19/2018 by Oswaldo Conroy, CNM No   Overview Addendum 04/30/2018 11:06 AM by Oswaldo Conroy, CNM    Clinic [facility] Prenatal Labs  Dating  Blood type: O/Negative/-- (04/30 1053)   Genetic Screen 1 Screen:    AFP:     Quad:     NIPS: Antibody:Negative (04/30 1053)  Anatomic [place] Pyelectasis left kidney Rubella: 3.62 (04/30 1053) Varicella: Immune  GTT Early:               Third trimester:  RPR: Non Reactive (04/30 1053)   Rhogam Given 04/30/18 HBsAg: Negative (04/30 1053)   TDaP vaccine                       Flu Shot: HIV: Non Reactive (04/30 1053)   Baby Food                                GBS:   Contraception  Pap: 07/30/16, NIL  CBB     CS/VBAC    Support Person             Short interval between pregnancies affecting pregnancy, antepartum 01/19/2018 by Oswaldo Conroy, CNM No   History of cesarean delivery 12/04/2016  by Nadara Mustard, MD No      Term labor symptoms and general obstetric precautions including but not limited to vaginal bleeding, contractions, leaking of fluid and fetal movement were reviewed in detail with the patient. Please refer to After Visit Summary for other counseling recommendations.   Return in about 1 week (around 07/10/2018) for Keep previously scheduled appt.  Thomasene Mohair, MD, [facility],  [facility] 07/03/2018 9:30 AM

## 2018-07-08 ENCOUNTER — Telehealth: Payer: Self-pay

## 2018-07-08 NOTE — Telephone Encounter (Signed)
FMLA/DISABILITY form for [employer] filled out, signature obtained and given to [place] for processing. 

## 2018-07-09 ENCOUNTER — Other Ambulatory Visit: Payer: Self-pay

## 2018-07-09 ENCOUNTER — Ambulatory Visit (INDEPENDENT_AMBULATORY_CARE_PROVIDER_SITE_OTHER): Payer: 59 | Admitting: Obstetrics and Gynecology

## 2018-07-09 ENCOUNTER — Encounter
Admission: RE | Admit: 2018-07-09 | Discharge: 2018-07-09 | Disposition: A | Discharge status: Home / Self Care | Payer: 59 | Source: Ambulatory Visit | Attending: Obstetrics and Gynecology | Admitting: Obstetrics and Gynecology

## 2018-07-09 ENCOUNTER — Encounter: Payer: Self-pay | Admitting: Obstetrics and Gynecology

## 2018-07-09 ENCOUNTER — Telehealth: Payer: Self-pay

## 2018-07-09 VITALS — BP 128/84 | Ht 64.0 in | Wt 192.0 lb

## 2018-07-09 DIAGNOSIS — Z3A38 38 weeks gestation of pregnancy: Secondary | ICD-10-CM

## 2018-07-09 DIAGNOSIS — Z6834 Body mass index (BMI) 34.0-34.9, adult: Secondary | ICD-10-CM

## 2018-07-09 DIAGNOSIS — O0990 Supervision of high risk pregnancy, unspecified, unspecified trimester: Secondary | ICD-10-CM

## 2018-07-09 DIAGNOSIS — Z98891 History of uterine scar from previous surgery: Secondary | ICD-10-CM

## 2018-07-09 DIAGNOSIS — O360130 Maternal care for anti-D [Rh] antibodies, third trimester, not applicable or unspecified: Secondary | ICD-10-CM

## 2018-07-09 DIAGNOSIS — O09899 Supervision of other high risk pregnancies, unspecified trimester: Secondary | ICD-10-CM

## 2018-07-09 DIAGNOSIS — Z6791 Unspecified blood type, Rh negative: Secondary | ICD-10-CM

## 2018-07-09 DIAGNOSIS — O99213 Obesity complicating pregnancy, third trimester: Secondary | ICD-10-CM

## 2018-07-09 DIAGNOSIS — O34219 Maternal care for unspecified type scar from previous cesarean delivery: Secondary | ICD-10-CM

## 2018-07-09 DIAGNOSIS — O26899 Other specified pregnancy related conditions, unspecified trimester: Secondary | ICD-10-CM

## 2018-07-09 HISTORY — DX: Headache: R51

## 2018-07-09 HISTORY — DX: Unspecified blood type, rh negative: Z67.91

## 2018-07-09 HISTORY — DX: Headache, unspecified: R51.9

## 2018-07-09 LAB — CBC
HEMATOCRIT: 37.7 % (ref 36.0–46.0)
Hemoglobin: 12.6 g/dL (ref 12.0–15.0)
MCH: 28.4 pg (ref 26.0–34.0)
MCHC: 33.4 g/dL (ref 30.0–36.0)
MCV: 85.1 fL (ref 80.0–100.0)
NRBC: 0 % (ref 0.0–0.2)
Platelets: 243 10*3/uL (ref 150–400)
RBC: 4.43 MIL/uL (ref 3.87–5.11)
RDW: 14 % (ref 11.5–15.5)
WBC: 9.4 10*3/uL (ref 4.0–10.5)

## 2018-07-09 LAB — TYPE AND SCREEN
ABO/RH(D): O NEG
ANTIBODY SCREEN: NEGATIVE
EXTEND SAMPLE REASON: UNDETERMINED

## 2018-07-09 MED ORDER — CEFAZOLIN SODIUM-DEXTROSE 2-4 GM/100ML-% IV SOLN
2.0000 g | INTRAVENOUS | Status: AC
  Administered 2018-07-10: 2 g via INTRAVENOUS
  Filled 2018-07-09 (×2): qty 100

## 2018-07-09 NOTE — Telephone Encounter (Signed)
Isidor Holts calling from [employer] to confirm pt was adv by provider to stop work 9/28.  Claim #[id number].  [phone].  Detailed msg left stating pt was adv by provider to stop working.

## 2018-07-09 NOTE — Patient Instructions (Addendum)
Your procedure is scheduled on: Friday 07/10/18  Please arrive to the Birthplace at 10:00am. You will enter through the main hospital entrance and take the elevators to the 3rd floor. You do not need to come through the Emergency Department.   Remember: Instructions that are not followed completely may result in serious medical risk, up to and including death, or upon the discretion of your surgeon and anesthesiologist your surgery may need to be rescheduled.     _X__ 1. Do not eat or drink after midnight the night before your procedure.                 __X__2.  On the morning of surgery brush your teeth with toothpaste and water, you may rinse your mouth with mouthwash if you wish.  Do not swallow any toothpaste of mouthwash.     _X__ 3.  No Alcohol for 24 hours before or after surgery.   _X__ 4.  Do Not Smoke or use e-cigarettes For 24 Hours Prior to Your Surgery.                 Do not use any chewable tobacco products for at least 6 hours prior to                 surgery.  ____  5.  Bring all medications with you on the day of surgery if instructed.   __X__  6.  Notify your doctor if there is any change in your medical condition      (cold, fever, infections).     Do not wear jewelry, make-up, hairpins, clips or nail polish. Do not wear lotions, powders, or perfumes.  Do not shave 48 hours prior to surgery. Men may shave face and neck. Do not bring valuables to the hospital.    [facility] is not responsible for any belongings or valuables.  Contacts, dentures/partials or body piercings may not be worn into surgery. Bring a case for your contacts, glasses or hearing aids, a denture cup will be supplied. Leave your suitcase in the car. After surgery it may be brought to your room. For patients admitted to the hospital, discharge time is determined by your treatment team.   Patients discharged the day of surgery will not be allowed to drive  home.                 Please read over the following fact sheets that you were given:   MRSA Information  __X__ Take these medicines the morning of surgery with A SIP OF WATER:     1. acetaminophen (TYLENOL) 325 MG tablet  2. fluticasone (FLONASE) 50 MCG/ACT nasal spray  3. ondansetron (ZOFRAN ODT) 4 MG disintegrating tablet   __X__ Use SAGE wipes as directed

## 2018-07-09 NOTE — H&P (View-Only) (Signed)
OB History & Physical   History of Present Illness:  Chief Complaint: here for c-section preoperative visit  HPI:  Loretta Jensen is a 32 y.o. G3P2002 female at [redacted]w[redacted]d dated by a 15 weeks ultrasound.  Her pregnancy has been complicated by history of cesarean delivery x 2, short interval between pregnancies, obesity in pregnancy with initial BMI 34, Rh negative status.    She denies contractions.   She denies leakage of fluid.   She denies vaginal bleeding.   She reports fetal movement.   Total weight gain for pregnancy: -9 pounds  Maternal Medical History:   Past Medical History:  Diagnosis Date  . Allergy   . Type O blood, Rh negative     Past Surgical History:  Procedure Laterality Date  . CESAREAN SECTION  2009  . CESAREAN SECTION N/A 03/02/2017   Procedure: CESAREAN SECTION;  Surgeon: Faizan Geraci D, MD;  Location: [facility] ORS;  Service: Obstetrics;  Laterality: N/A;    No Known Allergies  Prior to Admission medications   Medication Sig Start Date End Date Taking? Authorizing Provider  diphenhydrAMINE (BENADRYL) 25 MG tablet Take 25 mg by mouth daily as needed for allergies.     [provider]  fluticasone (FLONASE) 50 MCG/ACT nasal spray Place 2 sprays into both nostrils daily as needed for allergies.    [provider]  ondansetron (ZOFRAN ODT) 4 MG disintegrating tablet Take 1 tablet (4 mg total) by mouth every 6 (six) hours as needed for nausea. 06/17/18   Schmid, Jacelyn Y, CNM  Prenatal Vit-Fe Fumarate-FA (PRENATAL MULTIVITAMIN) TABS tablet Take 1 tablet by mouth daily at 12 noon.    [provider]    OB History  Gravida Para Term Preterm AB Living  3 2 2     2  SAB TAB Ectopic Multiple Live Births        0 2    # Outcome Date GA Lbr Len/2nd Weight Sex Delivery Anes PTL Lv  3 Current           2 Term 03/02/17 [redacted]w[redacted]d  6 lb 15.8 oz (3.17 kg) F CS-LTranv Spinal  LIV  1 Term 09/18/08 [redacted]w[redacted]d  7 lb 5 oz (3.317 kg) F CS-LTranv   LIV   Complications: Failure to Progress in Second Stage    Prenatal care site: [facility]  Social History: She  reports that she has never smoked. She has never used smokeless tobacco. She reports that she drank alcohol. She reports that she does not use drugs.  Family History: family history includes Breast cancer in her maternal grandmother, other, other, and other; Breast cancer (age of onset: 35) in her mother; Cancer in her maternal grandfather and mother.   Review of Systems: Negative x 10 systems reviewed except as noted in the HPI.    Physical Exam:  Vital Signs: BP 128/84   Ht 5' 4" (1.626 m)   Wt 192 lb (87.1 kg)   LMP 11/05/2017 (Approximate)   BMI 32.96 kg/m  Constitutional: Well nourished, well developed female in no acute distress.  HEENT: normal Skin: Warm and dry.  Cardiovascular: Regular rate and rhythm.   Extremity: no edema  Respiratory: Clear to auscultation bilateral. Normal respiratory effort Abdomen: FHT present and gravid, NT Back: no CVAT Neuro: DTRs 2+, Cranial nerves grossly intact Psych: Alert and Oriented x3. No memory deficits. Normal mood and affect.  MS: normal gait, normal bilateral lower extremity ROM/strength/stability. FHR: 145 bpm  Pertinent Results:  Prenatal Labs:   Blood type/Rh O negative  Antibody screen negative  Rubella Immune  Varicella Immune    RPR NR  HBsAg negative  HIV negative  GC negative  Chlamydia negative  Genetic screening declined  1 hour GTT early 96, 28 weeks=119  3 hour GTT n/a  GBS positive     Assessment:  Loretta Jensen is a 32 y.o. G3P2002 female at [redacted]w[redacted]d with history of cesarean delivery x2, here for pre-operative visit for repeat c-section tomorrow.   Plan:  1. Admit to Labor & Delivery on 07/10/18 2. CBC, T&S, NPO, IVF 3. GBS positive.   4. Reviewed consents.  Patient wishes to proceed.    Baleigh Rennaker, MD 07/09/2018 10:38 AM    

## 2018-07-09 NOTE — Progress Notes (Signed)
OB History & Physical   History of Present Illness:  Chief Complaint: here for c-section preoperative visit  HPI:  Loretta Jensen is a 32 y.o. G63P2002 female at [redacted]w[redacted]d dated by a 15 weeks ultrasound.  Her pregnancy has been complicated by history of cesarean delivery x 2, short interval between pregnancies, obesity in pregnancy with initial BMI 34, Rh negative status.    She denies contractions.   She denies leakage of fluid.   She denies vaginal bleeding.   She reports fetal movement.   Total weight gain for pregnancy: -9 pounds  Maternal Medical History:   Past Medical History:  Diagnosis Date  . Allergy   . Type O blood, Rh negative     Past Surgical History:  Procedure Laterality Date  . CESAREAN SECTION  2009  . CESAREAN SECTION N/A 03/02/2017   Procedure: CESAREAN SECTION;  Surgeon: Conard Novak, MD;  Location: [facility] ORS;  Service: Obstetrics;  Laterality: N/A;    No Known Allergies  Prior to Admission medications   Medication Sig Start Date End Date Taking? Authorizing Provider  diphenhydrAMINE (BENADRYL) 25 MG tablet Take 25 mg by mouth daily as needed for allergies.     [provider]  fluticasone (FLONASE) 50 MCG/ACT nasal spray Place 2 sprays into both nostrils daily as needed for allergies.    [provider]  ondansetron (ZOFRAN ODT) 4 MG disintegrating tablet Take 1 tablet (4 mg total) by mouth every 6 (six) hours as needed for nausea. 06/17/18   Oswaldo Conroy, CNM  Prenatal Vit-Fe Fumarate-FA (PRENATAL MULTIVITAMIN) TABS tablet Take 1 tablet by mouth daily at 12 noon.    [provider]    OB History  Gravida Para Term Preterm AB Living  3 2 2     2   SAB TAB Ectopic Multiple Live Births        0 2    # Outcome Date GA Lbr Len/2nd Weight Sex Delivery Anes PTL Lv  3 Current           2 Term 03/02/17 [redacted]w[redacted]d  6 lb 15.8 oz (3.17 kg) F CS-LTranv Spinal  LIV  1 Term 09/18/08 [redacted]w[redacted]d  7 lb 5 oz (3.317 kg) F CS-LTranv   LIV   Complications: Failure to Progress in Second Stage    Prenatal care site: [facility]  Social History: She  reports that she has never smoked. She has never used smokeless tobacco. She reports that she drank alcohol. She reports that she does not use drugs.  Family History: family history includes Breast cancer in her maternal grandmother, other, other, and other; Breast cancer (age of onset: 4) in her mother; Cancer in her maternal grandfather and mother.   Review of Systems: Negative x 10 systems reviewed except as noted in the HPI.    Physical Exam:  Vital Signs: BP 128/84   Ht 5\' 4"  (1.626 m)   Wt 192 lb (87.1 kg)   LMP 11/05/2017 (Approximate)   BMI 32.96 kg/m  Constitutional: Well nourished, well developed female in no acute distress.  HEENT: normal Skin: Warm and dry.  Cardiovascular: Regular rate and rhythm.   Extremity: no edema  Respiratory: Clear to auscultation bilateral. Normal respiratory effort Abdomen: FHT present and gravid, NT Back: no CVAT Neuro: DTRs 2+, Cranial nerves grossly intact Psych: Alert and Oriented x3. No memory deficits. Normal mood and affect.  MS: normal gait, normal bilateral lower extremity ROM/strength/stability. FHR: 145 bpm  Pertinent Results:  Prenatal Labs:  Blood type/Rh O negative  Antibody screen negative  Rubella Immune  Varicella Immune    RPR NR  HBsAg negative  HIV negative  GC negative  Chlamydia negative  Genetic screening declined  1 hour GTT early 96, 28 weeks=119  3 hour GTT n/a  GBS positive     Assessment:  Loretta Jensen is a 32 y.o. G53P2002 female at [redacted]w[redacted]d with history of cesarean delivery x2, here for pre-operative visit for repeat c-section tomorrow.   Plan:  1. Admit to Labor & Delivery on 07/10/18 2. CBC, T&S, NPO, IVF 3. GBS positive.   4. Reviewed consents.  Patient wishes to proceed.    Thomasene Mohair, MD 07/09/2018 10:38 AM

## 2018-07-10 ENCOUNTER — Encounter
Admission: RE | Disposition: A | Discharge status: Home / Self Care | Payer: Self-pay | Source: Home / Self Care | Attending: Obstetrics and Gynecology

## 2018-07-10 ENCOUNTER — Encounter: Payer: Self-pay | Admitting: *Deleted

## 2018-07-10 ENCOUNTER — Other Ambulatory Visit: Payer: Self-pay

## 2018-07-10 ENCOUNTER — Inpatient Hospital Stay
Admission: RE | Admit: 2018-07-10 | Discharge: 2018-07-12 | DRG: 784 | Disposition: A | Discharge status: Home / Self Care | Payer: 59 | Attending: Obstetrics and Gynecology | Admitting: Obstetrics and Gynecology

## 2018-07-10 ENCOUNTER — Inpatient Hospital Stay: Payer: 59 | Admitting: Anesthesiology

## 2018-07-10 DIAGNOSIS — O26899 Other specified pregnancy related conditions, unspecified trimester: Secondary | ICD-10-CM

## 2018-07-10 DIAGNOSIS — Z6791 Unspecified blood type, Rh negative: Secondary | ICD-10-CM

## 2018-07-10 DIAGNOSIS — Z3A39 39 weeks gestation of pregnancy: Secondary | ICD-10-CM | POA: Diagnosis not present

## 2018-07-10 DIAGNOSIS — O99824 Streptococcus B carrier state complicating childbirth: Secondary | ICD-10-CM | POA: Diagnosis not present

## 2018-07-10 DIAGNOSIS — E669 Obesity, unspecified: Secondary | ICD-10-CM | POA: Diagnosis present

## 2018-07-10 DIAGNOSIS — Z6834 Body mass index (BMI) 34.0-34.9, adult: Secondary | ICD-10-CM

## 2018-07-10 DIAGNOSIS — O9081 Anemia of the puerperium: Secondary | ICD-10-CM | POA: Diagnosis not present

## 2018-07-10 DIAGNOSIS — O360130 Maternal care for anti-D [Rh] antibodies, third trimester, not applicable or unspecified: Secondary | ICD-10-CM | POA: Diagnosis not present

## 2018-07-10 DIAGNOSIS — O26893 Other specified pregnancy related conditions, third trimester: Secondary | ICD-10-CM | POA: Diagnosis present

## 2018-07-10 DIAGNOSIS — O99214 Obesity complicating childbirth: Secondary | ICD-10-CM | POA: Diagnosis present

## 2018-07-10 DIAGNOSIS — D62 Acute posthemorrhagic anemia: Secondary | ICD-10-CM | POA: Diagnosis not present

## 2018-07-10 DIAGNOSIS — O34211 Maternal care for low transverse scar from previous cesarean delivery: Principal | ICD-10-CM | POA: Diagnosis present

## 2018-07-10 DIAGNOSIS — O0990 Supervision of high risk pregnancy, unspecified, unspecified trimester: Secondary | ICD-10-CM

## 2018-07-10 DIAGNOSIS — O99210 Obesity complicating pregnancy, unspecified trimester: Secondary | ICD-10-CM | POA: Diagnosis present

## 2018-07-10 DIAGNOSIS — G43709 Chronic migraine without aura, not intractable, without status migrainosus: Secondary | ICD-10-CM

## 2018-07-10 DIAGNOSIS — Z3A38 38 weeks gestation of pregnancy: Secondary | ICD-10-CM | POA: Diagnosis not present

## 2018-07-10 DIAGNOSIS — Z98891 History of uterine scar from previous surgery: Secondary | ICD-10-CM

## 2018-07-10 DIAGNOSIS — Z302 Encounter for sterilization: Secondary | ICD-10-CM | POA: Diagnosis not present

## 2018-07-10 DIAGNOSIS — O34219 Maternal care for unspecified type scar from previous cesarean delivery: Secondary | ICD-10-CM | POA: Diagnosis not present

## 2018-07-10 DIAGNOSIS — O09899 Supervision of other high risk pregnancies, unspecified trimester: Secondary | ICD-10-CM

## 2018-07-10 DIAGNOSIS — O99213 Obesity complicating pregnancy, third trimester: Secondary | ICD-10-CM

## 2018-07-10 HISTORY — PX: TUBAL LIGATION: SHX77

## 2018-07-10 LAB — RPR: RPR: NONREACTIVE

## 2018-07-10 SURGERY — Surgical Case
Anesthesia: Spinal

## 2018-07-10 MED ORDER — ONDANSETRON HCL 4 MG/2ML IJ SOLN
INTRAMUSCULAR | Status: DC | PRN
  Administered 2018-07-10: 4 mg via INTRAVENOUS

## 2018-07-10 MED ORDER — SIMETHICONE 80 MG PO CHEW: 80.0000 mg | CHEWABLE_TABLET | ORAL | Status: DC | PRN

## 2018-07-10 MED ORDER — OXYTOCIN 40 UNITS IN LACTATED RINGERS INFUSION - SIMPLE MED
INTRAVENOUS | Status: AC
  Administered 2018-07-10: 14:00:00
  Filled 2018-07-10: qty 1000

## 2018-07-10 MED ORDER — ACETAMINOPHEN 325 MG PO TABS: 650.0000 mg | ORAL_TABLET | ORAL | Status: DC | PRN

## 2018-07-10 MED ORDER — NALBUPHINE HCL 10 MG/ML IJ SOLN: 5.0000 mg | Freq: Once | INTRAMUSCULAR | Status: DC | PRN

## 2018-07-10 MED ORDER — GLYCOPYRROLATE 0.2 MG/ML IJ SOLN
INTRAMUSCULAR | Status: DC | PRN
  Administered 2018-07-10 (×2): 0.1 mg via INTRAVENOUS

## 2018-07-10 MED ORDER — COCONUT OIL OIL: 1.0000 "application " | TOPICAL_OIL | Status: DC | PRN

## 2018-07-10 MED ORDER — OXYCODONE-ACETAMINOPHEN 5-325 MG PO TABS
2.0000 | ORAL_TABLET | ORAL | Status: DC | PRN
  Administered 2018-07-11 – 2018-07-12 (×2): 2 via ORAL
  Filled 2018-07-10 (×2): qty 2

## 2018-07-10 MED ORDER — PROPOFOL 10 MG/ML IV BOLUS
INTRAVENOUS | Status: AC
  Filled 2018-07-10: qty 20

## 2018-07-10 MED ORDER — TETANUS-DIPHTH-ACELL PERTUSSIS 5-2.5-18.5 LF-MCG/0.5 IM SUSP: 0.5000 mL | INTRAMUSCULAR | Status: DC | PRN

## 2018-07-10 MED ORDER — LACTATED RINGERS IV SOLN: INTRAVENOUS | Status: DC

## 2018-07-10 MED ORDER — NALOXONE HCL 4 MG/10ML IJ SOLN
1.0000 ug/kg/h | INTRAVENOUS | Status: DC | PRN
  Filled 2018-07-10: qty 5

## 2018-07-10 MED ORDER — DEXAMETHASONE SODIUM PHOSPHATE 10 MG/ML IJ SOLN
INTRAMUSCULAR | Status: AC
  Filled 2018-07-10: qty 1

## 2018-07-10 MED ORDER — SODIUM CHLORIDE 0.9% FLUSH: 3.0000 mL | INTRAVENOUS | Status: DC | PRN

## 2018-07-10 MED ORDER — OXYCODONE-ACETAMINOPHEN 5-325 MG PO TABS
1.0000 | ORAL_TABLET | ORAL | Status: DC | PRN
  Administered 2018-07-11: 1 via ORAL
  Filled 2018-07-10: qty 1

## 2018-07-10 MED ORDER — OXYTOCIN 40 UNITS IN LACTATED RINGERS INFUSION - SIMPLE MED
INTRAVENOUS | Status: DC | PRN
  Administered 2018-07-10: 1000 mL via INTRAVENOUS

## 2018-07-10 MED ORDER — FENTANYL CITRATE (PF) 100 MCG/2ML IJ SOLN
INTRAMUSCULAR | Status: DC | PRN
  Administered 2018-07-10: 15 ug via INTRATHECAL
  Administered 2018-07-10: 50 ug via INTRAVENOUS
  Administered 2018-07-10: 35 ug via INTRAVENOUS

## 2018-07-10 MED ORDER — ONDANSETRON HCL 4 MG/2ML IJ SOLN: 4.0000 mg | Freq: Three times a day (TID) | INTRAMUSCULAR | Status: DC | PRN

## 2018-07-10 MED ORDER — SOD CITRATE-CITRIC ACID 500-334 MG/5ML PO SOLN
30.0000 mL | ORAL | Status: AC
  Administered 2018-07-10: 30 mL via ORAL
  Filled 2018-07-10: qty 15

## 2018-07-10 MED ORDER — ONDANSETRON HCL 4 MG/2ML IJ SOLN
INTRAMUSCULAR | Status: AC
  Filled 2018-07-10: qty 2

## 2018-07-10 MED ORDER — BUPIVACAINE 0.25 % ON-Q PUMP DUAL CATH 400 ML
400.0000 mL | INJECTION | Status: DC
  Filled 2018-07-10: qty 400

## 2018-07-10 MED ORDER — MEPERIDINE HCL 25 MG/ML IJ SOLN: 6.2500 mg | INTRAMUSCULAR | Status: DC | PRN

## 2018-07-10 MED ORDER — SIMETHICONE 80 MG PO CHEW
80.0000 mg | CHEWABLE_TABLET | Freq: Three times a day (TID) | ORAL | Status: DC
  Administered 2018-07-10 – 2018-07-12 (×6): 80 mg via ORAL
  Filled 2018-07-10 (×4): qty 1

## 2018-07-10 MED ORDER — GLYCOPYRROLATE 0.2 MG/ML IJ SOLN
INTRAMUSCULAR | Status: AC
  Filled 2018-07-10: qty 1

## 2018-07-10 MED ORDER — DIPHENHYDRAMINE HCL 25 MG PO CAPS: 25.0000 mg | ORAL_CAPSULE | Freq: Four times a day (QID) | ORAL | Status: DC | PRN

## 2018-07-10 MED ORDER — OXYTOCIN 40 UNITS IN LACTATED RINGERS INFUSION - SIMPLE MED: 2.5000 [IU]/h | INTRAVENOUS | Status: AC

## 2018-07-10 MED ORDER — BUPIVACAINE HCL 0.5 % IJ SOLN
INTRAMUSCULAR | Status: DC | PRN
  Administered 2018-07-10: 10 mL

## 2018-07-10 MED ORDER — BUPIVACAINE IN DEXTROSE 0.75-8.25 % IT SOLN
INTRATHECAL | Status: DC | PRN
  Administered 2018-07-10: 1.6 mL via INTRATHECAL

## 2018-07-10 MED ORDER — NALBUPHINE HCL 10 MG/ML IJ SOLN: 5.0000 mg | INTRAMUSCULAR | Status: DC | PRN

## 2018-07-10 MED ORDER — OXYTOCIN 40 UNITS IN LACTATED RINGERS INFUSION - SIMPLE MED
INTRAVENOUS | Status: AC
  Filled 2018-07-10: qty 1000

## 2018-07-10 MED ORDER — SODIUM CHLORIDE 0.9 % IV SOLN
INTRAVENOUS | Status: DC | PRN
  Administered 2018-07-10: 50 ug/min via INTRAVENOUS

## 2018-07-10 MED ORDER — WITCH HAZEL-GLYCERIN EX PADS: 1.0000 "application " | MEDICATED_PAD | CUTANEOUS | Status: DC | PRN

## 2018-07-10 MED ORDER — MORPHINE SULFATE (PF) 0.5 MG/ML IJ SOLN
INTRAMUSCULAR | Status: AC
  Filled 2018-07-10: qty 10

## 2018-07-10 MED ORDER — DIPHENHYDRAMINE HCL 50 MG/ML IJ SOLN: 12.5000 mg | INTRAMUSCULAR | Status: DC | PRN

## 2018-07-10 MED ORDER — SCOPOLAMINE 1 MG/3DAYS TD PT72: 1.0000 | MEDICATED_PATCH | Freq: Once | TRANSDERMAL | Status: DC

## 2018-07-10 MED ORDER — KETOROLAC TROMETHAMINE 30 MG/ML IJ SOLN
INTRAMUSCULAR | Status: DC | PRN
  Administered 2018-07-10: 30 mg via INTRAVENOUS

## 2018-07-10 MED ORDER — DEXAMETHASONE SODIUM PHOSPHATE 10 MG/ML IJ SOLN
INTRAMUSCULAR | Status: DC | PRN
  Administered 2018-07-10: 10 mg via INTRAVENOUS

## 2018-07-10 MED ORDER — BUPIVACAINE HCL (PF) 0.5 % IJ SOLN: 5.0000 mL | Freq: Once | INTRAMUSCULAR | Status: DC

## 2018-07-10 MED ORDER — MORPHINE SULFATE (PF) 0.5 MG/ML IJ SOLN
INTRAMUSCULAR | Status: DC | PRN
  Administered 2018-07-10: .1 mg via INTRATHECAL
  Administered 2018-07-10: 1 mg via INTRAVENOUS
  Administered 2018-07-10: 1.9 mg via INTRAVENOUS
  Administered 2018-07-10: 2 mg via INTRAVENOUS

## 2018-07-10 MED ORDER — KETOROLAC TROMETHAMINE 30 MG/ML IJ SOLN
INTRAMUSCULAR | Status: AC
  Filled 2018-07-10: qty 1

## 2018-07-10 MED ORDER — DIPHENHYDRAMINE HCL 25 MG PO CAPS: 25.0000 mg | ORAL_CAPSULE | ORAL | Status: DC | PRN

## 2018-07-10 MED ORDER — FENTANYL CITRATE (PF) 100 MCG/2ML IJ SOLN
INTRAMUSCULAR | Status: AC
  Filled 2018-07-10: qty 2

## 2018-07-10 MED ORDER — BUPIVACAINE HCL (PF) 0.5 % IJ SOLN
5.0000 mL | Freq: Once | INTRAMUSCULAR | Status: DC
  Filled 2018-07-10: qty 30

## 2018-07-10 MED ORDER — MENTHOL 3 MG MT LOZG
1.0000 | LOZENGE | OROMUCOSAL | Status: DC | PRN
  Filled 2018-07-10: qty 9

## 2018-07-10 MED ORDER — ACETAMINOPHEN 10 MG/ML IV SOLN
INTRAVENOUS | Status: DC | PRN
  Administered 2018-07-10: 1000 mg via INTRAVENOUS

## 2018-07-10 MED ORDER — ACETAMINOPHEN 10 MG/ML IV SOLN
INTRAVENOUS | Status: AC
  Filled 2018-07-10: qty 100

## 2018-07-10 MED ORDER — SIMETHICONE 80 MG PO CHEW
80.0000 mg | CHEWABLE_TABLET | ORAL | Status: DC
  Filled 2018-07-10 (×2): qty 1

## 2018-07-10 MED ORDER — NALOXONE HCL 0.4 MG/ML IJ SOLN: 0.4000 mg | INTRAMUSCULAR | Status: DC | PRN

## 2018-07-10 MED ORDER — PRENATAL MULTIVITAMIN CH
1.0000 | ORAL_TABLET | Freq: Every day | ORAL | Status: DC
  Administered 2018-07-10 – 2018-07-12 (×3): 1 via ORAL
  Filled 2018-07-10 (×3): qty 1

## 2018-07-10 MED ORDER — LIDOCAINE HCL (PF) 1 % IJ SOLN
INTRAMUSCULAR | Status: DC | PRN
  Administered 2018-07-10: 3 mL via SUBCUTANEOUS

## 2018-07-10 MED ORDER — IBUPROFEN 600 MG PO TABS
600.0000 mg | ORAL_TABLET | Freq: Four times a day (QID) | ORAL | Status: DC
  Administered 2018-07-10 – 2018-07-12 (×7): 600 mg via ORAL
  Filled 2018-07-10 (×7): qty 1

## 2018-07-10 MED ORDER — LACTATED RINGERS IV SOLN
INTRAVENOUS | Status: DC
  Administered 2018-07-10 (×2): via INTRAVENOUS

## 2018-07-10 MED ORDER — DIBUCAINE 1 % RE OINT: 1.0000 "application " | TOPICAL_OINTMENT | RECTAL | Status: DC | PRN

## 2018-07-10 MED ORDER — SENNOSIDES-DOCUSATE SODIUM 8.6-50 MG PO TABS
2.0000 | ORAL_TABLET | ORAL | Status: DC
  Administered 2018-07-11 – 2018-07-12 (×2): 2 via ORAL
  Filled 2018-07-10 (×3): qty 2

## 2018-07-10 SURGICAL SUPPLY — 33 items
CANISTER SUCT 3000ML PPV (MISCELLANEOUS) ×4 IMPLANT
CATH KIT ON-Q SILVERSOAK 5 (CATHETERS) ×4 IMPLANT
CATH KIT ON-Q SILVERSOAK 5IN (CATHETERS) ×8 IMPLANT
CLOSURE WOUND 1/2 X4 (GAUZE/BANDAGES/DRESSINGS) ×1
COVER WAND RF STERILE (DRAPES) ×4 IMPLANT
DERMABOND ADVANCED (GAUZE/BANDAGES/DRESSINGS) ×4
DERMABOND ADVANCED .7 DNX12 (GAUZE/BANDAGES/DRESSINGS) ×2 IMPLANT
DRSG OPSITE POSTOP 4X10 (GAUZE/BANDAGES/DRESSINGS) ×4 IMPLANT
DRSG TELFA 3X8 NADH (GAUZE/BANDAGES/DRESSINGS) ×4 IMPLANT
ELECT CAUTERY BLADE 6.4 (BLADE) ×4 IMPLANT
ELECT REM PT RETURN 9FT ADLT (ELECTROSURGICAL) ×4
ELECTRODE REM PT RTRN 9FT ADLT (ELECTROSURGICAL) ×2 IMPLANT
EXTRACTOR VACUUM KIWI (MISCELLANEOUS) ×2 IMPLANT
GAUZE SPONGE 4X4 12PLY STRL (GAUZE/BANDAGES/DRESSINGS) ×4 IMPLANT
GLOVE BIO SURGEON STRL SZ7 (GLOVE) ×10 IMPLANT
GLOVE INDICATOR 7.5 STRL GRN (GLOVE) ×10 IMPLANT
GOWN STRL REUS W/ TWL LRG LVL3 (GOWN DISPOSABLE) ×6 IMPLANT
GOWN STRL REUS W/TWL LRG LVL3 (GOWN DISPOSABLE) ×8
NS IRRIG 1000ML POUR BTL (IV SOLUTION) ×4 IMPLANT
PACK C SECTION AR (MISCELLANEOUS) ×4 IMPLANT
PAD DRESSING TELFA 3X8 NADH (GAUZE/BANDAGES/DRESSINGS) ×2 IMPLANT
PAD OB MATERNITY 4.3X12.25 (PERSONAL CARE ITEMS) ×8 IMPLANT
PAD PREP 24X41 OB/GYN DISP (PERSONAL CARE ITEMS) ×4 IMPLANT
STRIP CLOSURE SKIN 1/2X4 (GAUZE/BANDAGES/DRESSINGS) ×3 IMPLANT
SUT MNCRL 4-0 (SUTURE) ×2
SUT MNCRL 4-0 27XMFL (SUTURE) ×2
SUT PDS AB 1 TP1 96 (SUTURE) ×4 IMPLANT
SUT PLAIN 2 0 XLH (SUTURE) ×2 IMPLANT
SUT PLAIN GUT 0 (SUTURE) IMPLANT
SUT VIC AB 0 CTX 36 (SUTURE) ×4
SUT VIC AB 0 CTX36XBRD ANBCTRL (SUTURE) ×4 IMPLANT
SUTURE MNCRL 4-0 27XMF (SUTURE) ×2 IMPLANT
SWABSTK COMLB BENZOIN TINCTURE (MISCELLANEOUS) ×4 IMPLANT

## 2018-07-10 NOTE — Op Note (Signed)
Cesarean Section Operative Note    Loretta Jensen   07/10/2018   Pre-operative Diagnosis:  1) intrauterine pregnancy at [redacted]w[redacted]d  2) History of cesarean delivery, desires repeat 3) desires permanent sterilization  Post-operative Diagnosis:  1) intrauterine pregnancy at [redacted]w[redacted]d  2) History of cesarean delivery, desires repeat 3) desires permanent sterilization   Procedure:  1) Repeat low transverse cesarean section via Pfannenstiel incision with double-layer uterine closure 2) bilateral tubal ligation  Surgeon: Surgeon(s) and Role:    * Conard Novak, MD - Primary   Assistants: Maryruth Eve, CNM; No other capable assistant available, in surgery requiring high level assistant.  Anesthesia: spinal   Findings:  1) normal appearing gravid uterus, fallopian tubes, and ovaries 2) viable female infant with weight of 3,040 grams, APGARs 9 and 9   Quantified Blood Loss: 355 mL   Total IV Fluids: 1,500 ml   Urine Output: 100 mL clear urine at end of procedure  Specimens: portion of right and left fallopian tubes  Complications: no complications  Disposition: PACU - hemodynamically stable.   Maternal Condition: stable   Baby condition / location:  Couplet care / Skin to Skin  Procedure Details:  The patient was seen in the Holding Room. The risks, benefits, complications, treatment options, and expected outcomes were discussed with the patient. The patient concurred with the proposed plan, giving informed consent. identified as Advertising account planner and the procedure verified as C-Section Delivery. A Time Out was held and the above information confirmed.   After induction of anesthesia, the patient was draped and prepped in the usual sterile manner. A Pfannenstiel incision was made and carried down through the subcutaneous tissue to the fascia. Fascial incision was made and extended transversely. The fascia was separated from the underlying rectus tissue superiorly and inferiorly.  The peritoneum was identified and entered. Peritoneal incision was extended longitudinally. The bladder flap was bluntly and sharply freed from the lower uterine segment. A low transverse uterine incision was made and the hysterotomy was extended with cranial-caudal tension. Delivered from cephalic presentation was a 3,040 gram Living newborn infant(s) or Female with Apgar scores of 9 at one minute and 9 at five minutes. Cord ph was not sent the umbilical cord was clamped and cut cord blood was obtained for evaluation. The placenta was removed Intact and appeared normal. The uterine outline, tubes and ovaries appeared normal The uterine incision was closed with running locked sutures of 0 Vicryl.  A second layer of the same suture was thrown in an imbricating fashion.  Hemostasis was assured.    The tubal ligation portion of the procedure was performed at this point.  The left fallopian tube was identified and followed out to the fimbriated end.  A Babcock clamp was used to grasp the tube in the mid-isthmic portion and two 2-0 plain gut sutures were used to ligate the tube using a Parkland technique.  An approximately 3cm segment of tube was removed with hemostasis assured.  The same procedure was performed on the right fallopian tube with hemostasis noted, though a Pomeroy technique was employed.   The uterus was returned to the abdomen and the paracolic gutters were cleared of all clots and debris.  The rectus muscles were inspected and found to be hemostatic.  The On-Q catheter pumps were inserted in accordance with the manufacturer's recommendations.  The catheters were inserted approximately 4cm cephelad to the incision line, approximately 1cm apart, straddling the midline.  They were inserted to a depth of the  4th mark. They were positioned superficial to the rectus abdominus muscles and deep to the rectus fascia.    The fascia was then reapproximated with running sutures of 1-0 PDS, looped. The  subcutaneous tissue was reapproximated using 2-0 plain gut such that no greater than 2cm of dead space remained.  The subcuticular closure was performed using 4-0 monocryl. The skin closure was reinforced using surgical skin glue.  The On-Q catheters were bolused with 5 mL of 0.5% marcaine plain for a total of 10 mL.  The catheters were affixed to the skin with surgical skin glue, steri-strips, and tegaderm.    Instrument, sponge, and needle counts were correct prior the abdominal closure and were correct at the conclusion of the case.  The patient received Ancef 2 gram IV prior to skin incision (within 30 minutes). For VTE prophylaxis she was wearing SCDs throughout the case.  The assistant surgeon was a CNM due to lack of availability of another Sales promotion account executive.   Signed: Conard Novak, MD 07/10/2018 2:48 PM

## 2018-07-10 NOTE — Discharge Summary (Signed)
OB Discharge Summary     Patient Name: Loretta Jensen DOB: 1986-09-14 MRN: [id number]  Date of admission: 07/10/2018 Delivering MD: Thomasene Mohair, MD  Date of Delivery: 07/10/2018  Date of discharge: 07/12/2018 Admitting diagnosis: HISTORY OF PREVIOUS CESAREAN, DESIRES REPEAT, desires permanent sterilization Intrauterine pregnancy: [redacted]w[redacted]d     Secondary diagnosis: None     Discharge diagnosis: Term Pregnancy Delivered by low transverse Cesarean section with bilateral tubal ligation                                                                                             Post partum procedures: none  Augmentation: n/a  Complications: None  Hospital course:  Sceduled C/S   32 y.o. yo G3P3003 at [redacted]w[redacted]d was admitted to the hospital 07/10/2018 for scheduled cesarean section with the following indication:Elective Repeat.  Membrane Rupture Time/Date: 12:56 PM ,07/10/2018   Patient delivered a Viable female infant.07/10/2018  Details of operation can be found in separate operative note.  Pateint had an uncomplicated postpartum course.  She is ambulating, tolerating a regular diet, passing flatus, and urinating well. Patient is discharged home in stable condition on  07/12/2018        Physical exam  Vitals:   07/11/18 0736 07/11/18 1111 07/11/18 2309 07/12/18 0815  BP: (!) 94/59 (!) 94/53 113/69 108/71  Pulse: (!) 55 60 70 60  Resp: 18 18 18 18   Temp: 97.9 F (36.6 C) 97.6 F (36.4 C) 98.4 F (36.9 C) 97.8 F (36.6 C)  TempSrc: Oral Oral Oral Oral  SpO2: 99% 100% 97% 98%  Weight:      Height:       General: alert, cooperative and no distress, passing flatus, voiding without difficulty, ambulating Heart: RRR without murmur Lungs: CTA bilaterally, normal respiratory effort Lochia: appropriate Uterine Fundus: firm Incision: scant dried bloody drainage on honey comb dressing, ON Q inrtact DVT Evaluation: No evidence of DVT seen on physical exam.  Labs: Lab Results   Component Value Date   WBC 14.4 (H) 07/11/2018   HGB 10.6 (L) 07/11/2018   HCT 31.8 (L) 07/11/2018   MCV 84.8 07/11/2018   PLT 224 07/11/2018    Discharge instruction: per After Visit Summary.  Medications:  Allergies as of 07/12/2018   No Known Allergies     Medication List    STOP taking these medications   acetaminophen 325 MG tablet Commonly known as:  TYLENOL   diphenhydrAMINE 25 MG tablet Commonly known as:  BENADRYL   ondansetron 4 MG disintegrating tablet Commonly known as:  ZOFRAN-ODT     TAKE these medications   fluticasone 50 MCG/ACT nasal spray Commonly known as:  FLONASE Place 2 sprays into both nostrils daily as needed for allergies.   ibuprofen 600 MG tablet Commonly known as:  ADVIL,MOTRIN Take 1 tablet (600 mg total) by mouth every 6 (six) hours as needed for headache, mild pain, moderate pain or cramping.   oxyCODONE-acetaminophen 5-325 MG tablet Commonly known as:  PERCOCET/ROXICET Take 1 tablet by mouth every 6 (six) hours as needed for up to 7 days for severe pain.   prenatal multivitamin Tabs tablet  Take 1 tablet by mouth daily at 12 noon.       Diet: routine diet  Activity: Advance as tolerated. Pelvic rest for 6 weeks.   Outpatient follow up: Follow-up Information    Conard Novak, MD. Go in 3 day(s).   Specialty:  Obstetrics and Gynecology Why:  Post op incision check Contact information: [address] [place] [address] [phone]             Postpartum contraception: Tubal Ligation Rhogam Given postpartum: no, both mother and baby O neg blood type Rubella vaccine given postpartum: no Varicella vaccine given postpartum: no TDaP given antepartum or postpartum: declined Influenza vaccine: given 06/17/2018  Newborn Data: Live born female/ Justin  Birth Weight: 6 lb 11.2 oz (3040 g) APGAR: 9, 9  Newborn Delivery   Birth date/time:  07/10/2018 12:59:00 Delivery type:  C-Section, Low Transverse Trial  of labor:  No C-section categorization:  Repeat      Baby Feeding: Bottle and Breast  Disposition:home with mother  SIGNED: Farrel Conners, CNM

## 2018-07-10 NOTE — Consult Note (Signed)
Neonatology Note:   Attendance at C-section:    I was asked by Dr. Jean Rosenthal to attend this repeat C/S at term. The mother is a G3P2 O neg, GBS positive with obesity. Fetal ultrasound had shown left renal pyelectasis. ROM at delivery, fluid clear. Infant vigorous with good spontaneous cry and tone. Delayed cord clamping was done. Needed only minimal bulb suctioning. Ap 9/9. Lungs clear to ausc in DR. Infant is able to remain with his mother for skin to skin time under nursing supervision. Transferred to the care of Pediatrician.   Doretha Sou, MD

## 2018-07-10 NOTE — Interval H&P Note (Signed)
History and Physical Interval Note:  07/10/2018 12:13 PM  Loretta Jensen  has presented today for surgery, with the diagnosis of HISTORY OF PREVIOUS CESAREAN, DESIRES REPEAT and DESIRES PERMANENT STERILIZATION  The various methods of treatment have been discussed with the patient and family. After consideration of risks, benefits and other options for treatment, the patient has consented to  Procedure(s): CESAREAN SECTION (N/A) with bilateral tubal ligation as a surgical intervention .  The patient's history has been reviewed, patient examined, no change in status, stable for surgery.  I have reviewed the patient's chart and labs.  Questions were answered to the patient's satisfaction.    32 y.o. [id number]  with undesired fertility, desires permanent sterilization.  Other reversible forms of contraception were discussed with patient; she declines all other modalities. Permanent nature of as well as associated risks of the procedure discussed with patient including but not limited to: risk of regret, permanence of method, bleeding, infection, injury to surrounding organs and need for additional procedures.  Failure risk of 0.5-1% with increased risk of ectopic gestation if pregnancy occurs was also discussed with patient.    Thomasene Mohair, MD, [facility], [facility] 07/10/2018 12:13 PM

## 2018-07-10 NOTE — Anesthesia Post-op Follow-up Note (Signed)
Anesthesia QCDR form completed.        

## 2018-07-10 NOTE — Anesthesia Preprocedure Evaluation (Addendum)
Anesthesia Evaluation  Patient identified by MRN, date of birth, ID band Patient awake    Reviewed: Allergy & Precautions, H&P , NPO status , Patient's Chart, lab work & pertinent test results  Airway Mallampati: III  TM Distance: >3 FB Neck ROM: full    Dental  (+) Teeth Intact   Pulmonary neg pulmonary ROS,           Cardiovascular Exercise Tolerance: Good negative cardio ROS       Neuro/Psych  Headaches,    GI/Hepatic negative GI ROS,   Endo/Other    Renal/GU   negative genitourinary   Musculoskeletal   Abdominal   Peds  Hematology negative hematology ROS (+)   Anesthesia Other Findings Past Medical History: No date: Allergy No date: Headache No date: Type O blood, Rh negative  Past Surgical History: 2009: CESAREAN SECTION 03/02/2017: CESAREAN SECTION; N/A     Comment:  Procedure: CESAREAN SECTION;  Surgeon: Conard Novak, MD;  Location: [facility] ORS;  Service: Obstetrics;                Laterality: N/A;     Reproductive/Obstetrics (+) Pregnancy                            Anesthesia Physical Anesthesia Plan  ASA: II  Anesthesia Plan: Spinal   Post-op Pain Management:    Induction:   PONV Risk Score and Plan:   Airway Management Planned:   Additional Equipment:   Intra-op Plan:   Post-operative Plan:   Informed Consent: I have reviewed the patients History and Physical, chart, labs and discussed the procedure including the risks, benefits and alternatives for the proposed anesthesia with the patient or authorized representative who has indicated his/her understanding and acceptance.   Dental Advisory Given  Plan Discussed with: Anesthesiologist  Anesthesia Plan Comments:         Anesthesia Quick Evaluation

## 2018-07-10 NOTE — Transfer of Care (Signed)
Immediate Anesthesia Transfer of Care Note  Patient: Loretta Jensen  Procedure(s) Performed: Procedure(s): CESAREAN SECTION (N/A) BILATERAL TUBAL LIGATION (Bilateral)  Patient Location: PACU  Anesthesia Type:Spinal  Level of Consciousness: awake, alert  and oriented  Airway & Oxygen Therapy: Patient Spontanous Breathing and Patient connected to face mask oxygen  Post-op Assessment: Report given to RN and Post -op Vital signs reviewed and stable  Post vital signs: Reviewed and stable  Last Vitals:  Vitals:   07/10/18 1014 07/10/18 1404  BP: 114/76 124/70  Pulse: 90   Resp: 16 14  Temp: 36.9 C (!) 36.4 C  SpO2:  99%    Complications: No apparent anesthesia complications

## 2018-07-10 NOTE — Anesthesia Procedure Notes (Signed)
Spinal  Patient location during procedure: OR Start time: 07/10/2018 12:25 PM End time: 07/10/2018 12:32 PM Staffing Anesthesiologist: Jovita Gamma, MD Resident/CRNA: Sherol Dade, CRNA Performed: resident/CRNA  Preanesthetic Checklist Completed: patient identified, site marked, surgical consent, pre-op evaluation, timeout performed, IV checked, risks and benefits discussed and monitors and equipment checked Spinal Block Patient position: sitting Prep: ChloraPrep and site prepped and draped Patient monitoring: heart rate, continuous pulse ox and blood pressure Approach: midline Location: L3-4 Injection technique: single-shot Needle Needle type: Pencan and Introducer  Needle gauge: 24 G Assessment Sensory level: T4 Additional Notes Pt tolerated procedure; level tested and confirmed prior to surgical incision.

## 2018-07-10 NOTE — Lactation Note (Signed)
This note was copied from a baby's chart. Lactation Consultation Note  Patient Name: Boy Cumi Sanagustin [id number] Date: 07/10/2018 Reason for consult: Initial assessment;1st time breastfeeding   Maternal Data Formula Feeding for Exclusion: No Has patient been taught Hand Expression?: Yes Does the patient have breastfeeding experience prior to this delivery?: Yes  Feeding Feeding Type: Breast Fed  LATCH Score Latch: Grasps breast easily, tongue down, lips flanged, rhythmical sucking.  Audible Swallowing: A few with stimulation  Type of Nipple: Everted at rest and after stimulation  Comfort (Breast/Nipple): Soft / non-tender  Hold (Positioning): Assistance needed to correctly position infant at breast and maintain latch.  LATCH Score: 8  Interventions Interventions: Breast feeding basics reviewed;Assisted with latch;Skin to skin;Hand express;Breast compression;Support pillows  Lactation Tools Discussed/Used WIC Program: No   Consult Status Consult Status: Follow-up Date: 07/11/18 Follow-up type: In-patient  States she breastfed first child and also pumped and bottlefed, and milk production was decreased at 6 ths, she did not breastfeed her first child  Dyann Kief 07/10/2018, 5:45 PM

## 2018-07-11 LAB — CBC
HCT: 31.8 % — ABNORMAL LOW (ref 36.0–46.0)
HEMOGLOBIN: 10.6 g/dL — AB (ref 12.0–15.0)
MCH: 28.3 pg (ref 26.0–34.0)
MCHC: 33.3 g/dL (ref 30.0–36.0)
MCV: 84.8 fL (ref 80.0–100.0)
PLATELETS: 224 10*3/uL (ref 150–400)
RBC: 3.75 MIL/uL — ABNORMAL LOW (ref 3.87–5.11)
RDW: 14 % (ref 11.5–15.5)
WBC: 14.4 10*3/uL — AB (ref 4.0–10.5)
nRBC: 0 % (ref 0.0–0.2)

## 2018-07-11 NOTE — Anesthesia Postprocedure Evaluation (Signed)
Anesthesia Post Note  Patient: Loretta Jensen  Procedure(s) Performed: CESAREAN SECTION (N/A ) BILATERAL TUBAL LIGATION (Bilateral )  Patient location during evaluation: Mother Baby Anesthesia Type: Spinal Level of consciousness: oriented and awake and alert Pain management: pain level controlled Vital Signs Assessment: post-procedure vital signs reviewed and stable Respiratory status: spontaneous breathing, respiratory function stable and nonlabored ventilation Cardiovascular status: blood pressure returned to baseline and stable Postop Assessment: no headache, no backache, able to ambulate and adequate PO intake Anesthetic complications: no     Last Vitals:  Vitals:   07/11/18 0320 07/11/18 0736  BP: (!) 90/55 (!) 94/59  Pulse: (!) 55 (!) 55  Resp: 18 18  Temp: 36.6 C 36.6 C  SpO2: 99% 99%    Last Pain:  Vitals:   07/11/18 0750  TempSrc:   PainSc: 0-No pain                 Lenard Simmer

## 2018-07-11 NOTE — Anesthesia Post-op Follow-up Note (Signed)
  Anesthesia Pain Follow-up Note  Patient: Loretta Jensen  Day #: 1  Date of Follow-up: 07/11/2018 Time: 11:00 AM  Last Vitals:  Vitals:   07/11/18 0320 07/11/18 0736  BP: (!) 90/55 (!) 94/59  Pulse: (!) 55 (!) 55  Resp: 18 18  Temp: 36.6 C 36.6 C  SpO2: 99% 99%    Level of Consciousness: alert  Pain: none   Side Effects:None  Catheter Site Exam:clean, dry     Plan: D/C from anesthesia care at surgeon's request  Lenard Simmer

## 2018-07-11 NOTE — Lactation Note (Addendum)
This note was copied from a baby's chart. Lactation Consultation Note  Patient Name: Boy Baudelia Schroepfer [id number] Date: 07/11/2018   Mom has been bottlefeeding formula all day.  During the day when [facility] had checked on mom/baby, mom reports she had just fed bottle of formula.  Wanted to check with mom one more time before leaving for the day, to see if her plan was to still breast feed or to pump.  Father of baby was feeding bottle of formula.  Mom said she had no milk with first baby and mostly pumped with second baby.  Mom was giving lots of excuses why she did not think she could breast feed with the main one being that she was not producing any milk.  When asked if she had tried to hand express, she states that she did not want any body pushing on her breast.  Explained supply and demand and need for frequent stimulation to bring in mature milk and ensure a plentiful milk supply.  Mom declines pumping saying I just don't think it is going to work.  Told her to call if she changed her mind.  Maternal Data    Feeding    LATCH Score                   Interventions    Lactation Tools Discussed/Used     Consult Status      Louis Meckel 07/11/2018, 8:40 PM

## 2018-07-11 NOTE — Progress Notes (Signed)
POD #1 Repeat CS and BTL Subjective:   Doing well. Tolerating a regular diet. Foley discontinued this AM. Breast and bottle feeding. Good pain control  Objective:  Blood pressure (!) 94/59, pulse (!) 55, temperature 97.9 F (36.6 C), temperature source Oral, resp. rate 18, height 5\' 4"  (1.626 m), weight 86.2 kg, last menstrual period 11/05/2017, SpO2 99 %, unknown if currently breastfeeding.  General: BF in NAD Pulmonary: no increased work of breathing/ CTAB Heart: mild bradycardia (56) and no murmurs Abdomen: non-distended, non-tender, fundus firm at level of umbilicus-1FB, BS present Incision: Small quarter sized area of drainage on left side off honey comb dressing, otherwise intact dressing. ON Q intact. Extremities: SCDs on  Results for orders placed or performed during the hospital encounter of 07/10/18 (from the past 72 hour(s))  CBC     Status: Abnormal   Collection Time: 07/11/18  4:39 AM  Result Value Ref Range   WBC 14.4 (H) 4.0 - 10.5 K/uL   RBC 3.75 (L) 3.87 - 5.11 MIL/uL   Hemoglobin 10.6 (L) 12.0 - 15.0 g/dL   HCT 16.1 (L) 09.6 - 04.5 %   MCV 84.8 80.0 - 100.0 fL   MCH 28.3 26.0 - 34.0 pg   MCHC 33.3 30.0 - 36.0 g/dL   RDW 40.9 81.1 - 91.4 %   Platelets 224 150 - 400 K/uL   nRBC 0.0 0.0 - 0.2 %    Comment: Performed at [facility], [address] [place], [place] [address]     Assessment:   32 y.o. [phone] postoperativeday # 1 stable  Trial of voiding  Ambulate with assistance  Continue postpartum care/support   Plan:  1) Acute blood loss anemia - hemodynamically stable and asymptomatic - po vitamins with iron  2)O negative.  Information for the patient's newborn:  [place], [place] [[id number]]  O NEG Rhogam not indicated  3) TDAP-last dose 2018  4) Breast and Bottle/Contraception-BTL  Farrel Conners, CNM

## 2018-07-12 LAB — RNA QUALITATIVE: HIV 1 RNA Qualitative: 1

## 2018-07-12 LAB — HIV-1/2 AB - DIFFERENTIATION
HIV 1 Ab: NEGATIVE
HIV 2 AB: NEGATIVE
NOTE (HIV CONF MULTISPOT): NEGATIVE

## 2018-07-12 MED ORDER — OXYCODONE-ACETAMINOPHEN 5-325 MG PO TABS: 1.0000 | ORAL_TABLET | Freq: Four times a day (QID) | ORAL | 0 refills | Status: AC | PRN

## 2018-07-12 MED ORDER — IBUPROFEN 600 MG PO TABS: 600.0000 mg | ORAL_TABLET | Freq: Four times a day (QID) | ORAL | 1 refills | Status: DC | PRN

## 2018-07-12 NOTE — Discharge Instructions (Signed)
Call your doctor for increased pain or vaginal bleeding temperature above 100.4, depression, or concerns.  Keep incision clean and dry.  Call your doctor for incision concerns including redness, swelling, bleeding or drainage, or if begins to come apart.  Please follow instructions included in incision hygiene kit.  No strenuous activity or heavy lifting for 6 weeks.  No intercourse, tampons, or douching for 6 weeks.  No tub baths- showers only.  No driving for 2 weeks or while taking Percocet.  Increase calories and fluids while breastfeeding.  Continue prenatal vitamin and iron.

## 2018-07-12 NOTE — Progress Notes (Signed)
Education provided on need for TDaP vaccine.  Pt declines vaccines at this time.  Pt states that she received Influenza vaccine this season.  Reynold Bowen, RN 07/12/2018 11:08 AM

## 2018-07-12 NOTE — Progress Notes (Signed)
Discharge instructions provided.  Pt and sig other verbalize understanding of all instructions and follow-up care.  Incision hygiene kit given.  Pt discharged to home with infant at 60 on 07/12/18 via wheelchair by volunteer.Reed Breech, RN 07/12/2018 6:22 PM

## 2018-07-12 NOTE — Lactation Note (Signed)
This note was copied from a baby's chart. Lactation Consultation Note  Patient Name: Loretta Jensen [id number] Date: 07/12/2018 Reason for consult: Follow-up assessment;Term;Mother's request  Mom asked to see lactation.  Mom wanted to know that if she started leaking (because was feeling a little fuller) could she just put the baby to the breast.  Praised mom for considering to breast feed and give her baby her healthy breast milk.  Stressed to mom that as mature milk was transitioning in, that she would need to continue to remove that milk to ensure a plentiful milk supply and to prevent engorgement.  Explained the difference in breast fullness, engorgement and mastitis.  Also discussed how to dry up milk if she chose not to breast feed.  Lactation community resources given along with lactation consultant numbers to call with any questions, concerns or assistance. Maternal Data Formula Feeding for Exclusion: No Has patient been taught Hand Expression?: Yes(Mom does not like hand expression, but explained may need when mature milk transitions) Does the patient have breastfeeding experience prior to this delivery?: Yes  Feeding Feeding Type: Bottle Fed - Formula Nipple Type: Slow - flow  LATCH Score                   Interventions    Lactation Tools Discussed/Used WIC Program: No([employer])   Consult Status Consult Status: PRN    Louis Meckel 07/12/2018, 2:14 PM

## 2018-07-13 ENCOUNTER — Encounter: Payer: Self-pay | Admitting: Obstetrics and Gynecology

## 2018-07-15 ENCOUNTER — Encounter: Payer: Self-pay | Admitting: Obstetrics and Gynecology

## 2018-07-15 ENCOUNTER — Ambulatory Visit (INDEPENDENT_AMBULATORY_CARE_PROVIDER_SITE_OTHER): Payer: 59 | Admitting: Obstetrics and Gynecology

## 2018-07-15 VITALS — BP 118/78 | Ht 64.0 in | Wt 186.0 lb

## 2018-07-15 DIAGNOSIS — Z9889 Other specified postprocedural states: Secondary | ICD-10-CM

## 2018-07-15 DIAGNOSIS — Z09 Encounter for follow-up examination after completed treatment for conditions other than malignant neoplasm: Secondary | ICD-10-CM

## 2018-07-15 LAB — SURGICAL PATHOLOGY

## 2018-07-15 NOTE — Progress Notes (Signed)
   Postoperative Follow-up Patient presents post op from cesarean section with tubal ligation  1 week ago.  Subjective: She denies fever, chills, nausea and vomiting. Eating a regular diet without difficulty. Pain is controlled with current analgesics. Medications being used: ibuprofen (OTC) and narcotic analgesics including percocet.  Activity: increasing slowly. She denies issues with her incision.    Objective: BP 118/78   Ht 5\' 4"  (1.626 m)   Wt 186 lb (84.4 kg)   BMI 31.93 kg/m   Constitutional: Well nourished, well developed female in no acute distress.  HEENT: normal Skin: Warm and dry.  Abdomen: S/NT/ND/+BS, uterine fundus at U-3 clean, dry, intact and without erythema, induration, warmth, and tenderness Extremity: no edema   Assessment: 32 y.o. s/p cesarean section with tubal ligation progressing well  Plan: Patient has done well after surgery with no apparent complications.  I have discussed the post-operative course to date, and the expected progress moving forward.  The patient understands what complications to be concerned about.    Activity plan: increase slowly Incision care instructions provided.   Return in about 5 weeks (around 08/19/2018) for Six Week Postpartum.  Thomasene Mohair, MD 07/15/2018 4:29 PM

## 2018-07-20 ENCOUNTER — Telehealth: Payer: Self-pay

## 2018-07-20 NOTE — Telephone Encounter (Signed)
Pt is experiancing red patches on her stomach above incision line and on her legs, some itching but not a ton. I have spoken with JS to see what she thinks and she has advised me to tell her to take benedryl for about an hour and if it doesn't help she would need to be seen. I have relayed that information to her and she will be calling back in about an hour if there is any issue

## 2018-07-22 ENCOUNTER — Ambulatory Visit (INDEPENDENT_AMBULATORY_CARE_PROVIDER_SITE_OTHER): Payer: 59 | Admitting: Obstetrics and Gynecology

## 2018-07-22 ENCOUNTER — Encounter: Payer: Self-pay | Admitting: Obstetrics and Gynecology

## 2018-07-22 ENCOUNTER — Other Ambulatory Visit: Payer: Self-pay | Admitting: Obstetrics and Gynecology

## 2018-07-22 VITALS — BP 104/60 | HR 62 | Ht 64.0 in | Wt 180.0 lb

## 2018-07-22 DIAGNOSIS — L03311 Cellulitis of abdominal wall: Secondary | ICD-10-CM

## 2018-07-22 DIAGNOSIS — T8149XA Infection following a procedure, other surgical site, initial encounter: Secondary | ICD-10-CM

## 2018-07-22 DIAGNOSIS — Z9889 Other specified postprocedural states: Secondary | ICD-10-CM

## 2018-07-22 MED ORDER — SULFAMETHOXAZOLE-TRIMETHOPRIM 800-160 MG PO TABS: 1.0000 | ORAL_TABLET | Freq: Two times a day (BID) | ORAL | 1 refills | Status: AC

## 2018-07-28 ENCOUNTER — Ambulatory Visit (INDEPENDENT_AMBULATORY_CARE_PROVIDER_SITE_OTHER): Payer: 59 | Admitting: Obstetrics and Gynecology

## 2018-07-28 VITALS — BP 122/74 | Ht 64.0 in | Wt 176.0 lb

## 2018-07-28 DIAGNOSIS — Z09 Encounter for follow-up examination after completed treatment for conditions other than malignant neoplasm: Secondary | ICD-10-CM

## 2018-07-28 NOTE — Progress Notes (Signed)
   Postoperative Follow-up Patient presents post op from cesarean section with tubal ligation  3 weeks ago.  Subjective: She denies fever, chills, nausea and vomiting. Eating a regular diet without difficulty. The patient is not having any pain.  Activity: increasing appropriately. She was seen last week by Dr. Jerene Pitch who diagnosed her with a wound infection. The wound has not leaked or drained.  She had some, what she described as, blisters below her incision site and on her legs.  She also had some erythema surrounding her incision site.  Today she states that her blisters have improved greatly with the antibiotic treatment given to her by Dr. Jerene Pitch.  She denies any issues with her incision site.  Objective: BP 122/74   Ht 5\' 4"  (1.626 m)   Wt 176 lb (79.8 kg)   BMI 30.21 kg/m   Constitutional: Well nourished, well developed female in no acute distress.  HEENT: normal Skin: Warm and dry.  Abdomen: Soft, non-tender, normal bowel sounds; no bruits, organomegaly or masses. clean, dry and intact, there is skin discoloration above and below the incision focally.  There is no induration, warmth, or tenderness.  Extremity: no edema   Assessment: 32 y.o. s/p cesarean section with tubal ligation with wound cellulitis, which is responding well to treatment.   Plan: The patient is to finish her full course of treatment.  She will follow-up for her routine 6-week postpartum visit.  However, if her wound issue worsens or she develops new or concerning symptoms, she was instructed to return to the office immediately.  Return for Keep previously schedule appointment in about 3 weeks.  Thomasene Mohair, MD 07/29/2018 6:19 PM

## 2018-07-29 ENCOUNTER — Encounter: Payer: Self-pay | Admitting: Obstetrics and Gynecology

## 2018-07-29 NOTE — Progress Notes (Signed)
  Postoperative Follow-up Patient presents post op from cesarean section 2 weeks ago.  Subjective: Patient reports some improvement in her preop symptoms. Eating a regular diet without difficulty. Pain is controlled without any medications.  Activity: normal activities of daily living. Patient reports additional symptom's since surgery of itching and a skin rash that is diffuse on her abdomen and legs.  After further questioning she reports that after her last cesarean section she had a wound infection and these symptoms are similar. She denies drainage form the incision. She denies fevers or chills.   Objective: BP 104/60   Pulse 62   Ht 5\' 4"  (1.626 m)   Wt 180 lb (81.6 kg)   Breastfeeding? Yes   BMI 30.90 kg/m  Physical Exam  Constitutional: She is oriented to person, place, and time. She appears well-developed.  Genitourinary: Vagina normal and uterus normal. There is no lesion on the right labia. There is no lesion on the left labia. Vagina exhibits no lesion. Right adnexum does not display mass. Left adnexum does not display mass. Cervix does not exhibit motion tenderness.  HENT:  Head: Normocephalic and atraumatic.  Eyes: EOM are normal.  Neck: Neck supple. No thyromegaly present.  Cardiovascular: Normal rate, regular rhythm and normal heart sounds.  Pulmonary/Chest: Effort normal and breath sounds normal. Right breast exhibits no inverted nipple, no mass, no nipple discharge and no skin change. Left breast exhibits no inverted nipple, no mass, no nipple discharge and no skin change.  Abdominal: Soft. Bowel sounds are normal. She exhibits no distension and no mass.    Neurological: She is alert and oriented to person, place, and time.  Skin: Skin is warm and dry.  Psychiatric: She has a normal mood and affect. Her behavior is normal. Judgment and thought content normal.  Vitals reviewed.   Assessment: s/p :  cesarean section with cellulitis  Plan: Will start oral  antibiotics. Asked her to monitor the incision and notify us or go to the hospital if it is worsening.  I have discussed the post-operative course to date, and the expected progress moving forward.  The patient understands what complications to be concerned about.  I will see the patient in routine follow up, or sooner if needed.    More than 15 minutes were spent face to face with the patient in the room with more than 50% of the time spent providing counseling and discussing the plan of management.    Christanna R Schuman 07/29/2018, 12:01 PM

## 2018-08-19 ENCOUNTER — Ambulatory Visit (INDEPENDENT_AMBULATORY_CARE_PROVIDER_SITE_OTHER): Payer: 59 | Admitting: Obstetrics and Gynecology

## 2018-08-19 ENCOUNTER — Encounter: Payer: Self-pay | Admitting: Obstetrics and Gynecology

## 2018-08-19 DIAGNOSIS — Z124 Encounter for screening for malignant neoplasm of cervix: Secondary | ICD-10-CM

## 2018-08-19 DIAGNOSIS — Z1389 Encounter for screening for other disorder: Secondary | ICD-10-CM

## 2018-08-19 NOTE — Progress Notes (Signed)
Postpartum Visit   Chief Complaint  Patient presents with  . 6 week Post partum   History of Present Illness: Patient is a 32 y.o. [id number] presents for postpartum visit.  Date of delivery: 07/10/2018 Type of delivery: C-Section Episiotomy No.  Laceration: no  Pregnancy or labor problems:  no Any problems since the delivery:  no  Newborn Details:  SINGLETON :  1. Baby's name: Jill AlexandersJustin. Birth weight: 6.11 Maternal Details:  Breast Feeding:  yes Post partum depression/anxiety noted:  no Edinburgh Post-Partum Depression Score:  7  Date of last PAP: 07/2016/NORMAL  Past Medical History:  Diagnosis Date  . Allergy   . Headache   . Type O blood, Rh negative     Past Surgical History:  Procedure Laterality Date  . CESAREAN SECTION  2009  . CESAREAN SECTION N/A 03/02/2017   Procedure: CESAREAN SECTION;  Surgeon: Conard NovakJackson, Rondrick Barreira D, MD;  Location: [facility];  Service: Obstetrics;  Laterality: N/A;  . CESAREAN SECTION N/A 07/10/2018   Procedure: CESAREAN SECTION;  Surgeon: Conard NovakJackson, Adrienne Trombetta D, MD;  Location: [facility];  Service: Obstetrics;  Laterality: N/A;  . TUBAL LIGATION Bilateral 07/10/2018   Procedure: BILATERAL TUBAL LIGATION;  Surgeon: Conard NovakJackson, Iyania Denne D, MD;  Location: [facility];  Service: Obstetrics;  Laterality: Bilateral;    Prior to Admission medications   Medication Sig Start Date End Date Taking? Authorizing Provider  ibuprofen (ADVIL,MOTRIN) 600 MG tablet Take 1 tablet (600 mg total) by mouth every 6 (six) hours as needed for headache, mild pain, moderate pain or cramping. 07/12/18  Yes Farrel ConnersGutierrez, Colleen, CNM  Prenatal Vit-Fe Fumarate-FA (PRENATAL MULTIVITAMIN) TABS tablet Take 1 tablet by mouth daily at 12 noon.   Yes [provider]  fluticasone (FLONASE) 50 MCG/ACT nasal spray Place 2 sprays into both nostrils daily as needed for allergies.    [provider]    No Known Allergies   Social History   Socioeconomic History  . Marital status:  Single    Spouse name: Not on file  . Number of children: 2  . Years of education: 3813  . Highest education level: Not on file  Occupational History  . Occupation: laminate and crush foam    Comment: [employer]  Social Needs  . Financial resource strain: Not on file  . Food insecurity:    Worry: Not on file    Inability: Not on file  . Transportation needs:    Medical: Not on file    Non-medical: Not on file  Tobacco Use  . Smoking status: Never Smoker  . Smokeless tobacco: Never Used  Substance and Sexual Activity  . Alcohol use: Not Currently    Alcohol/week: 0.0 standard drinks    Comment: occasionally  . Drug use: No  . Sexual activity: Not Currently    Partners: Male    Birth control/protection: Surgical  Lifestyle  . Physical activity:    Days per week: Not on file    Minutes per session: Not on file  . Stress: Not on file  Relationships  . Social connections:    Talks on phone: Not on file    Gets together: Not on file    Attends religious service: Not on file    Active member of club or organization: Not on file    Attends meetings of clubs or organizations: Not on file    Relationship status: Not on file  . Intimate partner violence:    Fear of current or ex partner: Not on file  Emotionally abused: Not on file    Physically abused: Not on file    Forced sexual activity: Not on file  Other Topics Concern  . Not on file  Social History Narrative  . Not on file    Family History  Problem Relation Age of Onset  . Cancer Mother   . Breast cancer Mother 28  . Breast cancer Maternal Grandmother   . Cancer Maternal Grandfather        2018 - liver  . Breast cancer Other        2018  . Breast cancer Other        2013  . Breast cancer Other        1990s    Review of Systems  Constitutional: Negative.   HENT: Negative.   Eyes: Negative.   Respiratory: Negative.   Cardiovascular: Negative.   Gastrointestinal: Positive for abdominal pain (occasional  mild RLQ near incision site). Negative for blood in stool, constipation, diarrhea, heartburn, melena, nausea and vomiting.  Genitourinary: Negative.   Musculoskeletal: Negative.   Skin: Negative.   Neurological: Negative.   Psychiatric/Behavioral: Negative.      Physical Exam BP 118/74   Ht 5\' 4"  (1.626 m)   Wt 184 lb (83.5 kg)   Breastfeeding? Yes   BMI 31.58 kg/m   Physical Exam  Constitutional: She is oriented to person, place, and time. She appears well-developed and well-nourished. No distress.  Genitourinary: Vagina normal and uterus normal. Pelvic exam was performed with patient supine. There is no rash, tenderness, lesion or injury on the right labia. There is no rash, tenderness, lesion or injury on the left labia. Right adnexum does not display mass, does not display tenderness and does not display fullness. Left adnexum does not display mass, does not display tenderness and does not display fullness. Cervix does not exhibit motion tenderness, lesion or polyp.   Uterus is mobile and anteverted. Uterus is not enlarged, tender, exhibiting a mass or irregular (is regular).  Eyes: EOM are normal. No scleral icterus.  Neck: Normal range of motion. Neck supple.  Cardiovascular: Normal rate and regular rhythm.  Pulmonary/Chest: Effort normal and breath sounds normal. No respiratory distress. She has no wheezes. She has no rales.  Abdominal: Soft. Bowel sounds are normal. She exhibits no distension and no mass. There is no tenderness. There is no rebound and no guarding.  Musculoskeletal: Normal range of motion. She exhibits no edema.  Neurological: She is alert and oriented to person, place, and time. No cranial nerve deficit.  Skin: Skin is warm and dry. No erythema.  Psychiatric: She has a normal mood and affect. Her behavior is normal. Judgment normal.     Female Chaperone present during breast and/or pelvic exam.  Assessment: 32 y.o. Z6X0960 presenting for 6 week postpartum  visit  Plan: Problem List Items Addressed This Visit    None    Visit Diagnoses    Postpartum care and examination    -  Primary   Relevant Orders   Cytology - PAP   Pap smear for cervical cancer screening       Relevant Orders   Cytology - PAP       1) Contraception: s/p BTL  2)  Pap - ASCCP guidelines and rational discussed.  Patient opts for routine screening interval.  Pap smear performed today.   3) Patient underwent screening for postpartum depression with no concerns noted.  4) Follow up 1 year for routine annual exam  Jeannett Senior  Jean Rosenthal, MD 08/19/2018 8:30 AM

## 2018-12-23 ENCOUNTER — Other Ambulatory Visit: Payer: Self-pay

## 2018-12-23 ENCOUNTER — Ambulatory Visit
Admission: EM | Admit: 2018-12-23 | Discharge: 2018-12-23 | Disposition: A | Discharge status: Home / Self Care | Payer: 59 | Attending: Family Medicine | Admitting: Family Medicine

## 2018-12-23 DIAGNOSIS — J301 Allergic rhinitis due to pollen: Secondary | ICD-10-CM

## 2018-12-23 MED ORDER — IPRATROPIUM BROMIDE 0.06 % NA SOLN: 2.0000 | Freq: Four times a day (QID) | NASAL | 0 refills | Status: DC | PRN

## 2018-12-23 MED ORDER — CETIRIZINE-PSEUDOEPHEDRINE ER 5-120 MG PO TB12: 1.0000 | ORAL_TABLET | Freq: Two times a day (BID) | ORAL | 0 refills | Status: DC | PRN

## 2018-12-23 NOTE — ED Triage Notes (Signed)
Patient complains of sinus pain and pressure, nasal drainage. Patient states that she was outside with a lot of pollen on Saturday and has had symptoms since.

## 2018-12-23 NOTE — ED Provider Notes (Signed)
[facility]    CSN: [id number] Arrival date & time: 12/23/18  1043  History   Chief Complaint Chief Complaint  Patient presents with  . Sinus Problem   HPI  33 year old female presents with the above complaint.  Patient reports sinus congestion, pain, pressure, and rhinorrhea since Saturday.  Patient states that she believes that she is suffering from allergies.  She was recently outside and exposed to a lot of pollen.  She has taken Benadryl and NyQuil without resolution.  No fever.  No purulent nasal discharge.  No other associated symptoms.  Patient requesting work note as well as her work would not let her return without a note due to COVID-19.  History reviewed as below.  Past Medical History:  Diagnosis Date  . Allergy   . Headache   . Type O blood, Rh negative    Patient Active Problem List   Diagnosis Date Noted  . Delivery of pregnancy by cesarean section 07/12/2018  . Postpartum care following cesarean delivery 07/12/2018  . Rh negative state in antepartum period 07/09/2018  . Nausea and vomiting during pregnancy 04/24/2018  . Obesity affecting pregnancy 03/04/2018  . BMI 34.0-34.9,adult 03/04/2018  . Migraine headache 01/23/2018  . Supervision of high risk pregnancy, antepartum 01/19/2018  . Short interval between pregnancies affecting pregnancy, antepartum 01/19/2018  . Status post cesarean delivery 03/02/2017  . History of cesarean delivery 12/04/2016  . Allergic otitis media 09/29/2015  . Occupational exposure to dust 05/23/2015  . Obesity, Class I, BMI 30-34.9 05/23/2015  . Influenza vaccination declined by patient 05/23/2015    Past Surgical History:  Procedure Laterality Date  . CESAREAN SECTION  2009  . CESAREAN SECTION N/A 03/02/2017   Procedure: CESAREAN SECTION;  Surgeon: Conard Novak, MD;  Location: [facility];  Service: Obstetrics;  Laterality: N/A;  . CESAREAN SECTION N/A 07/10/2018   Procedure: CESAREAN SECTION;  Surgeon:  Conard Novak, MD;  Location: [facility];  Service: Obstetrics;  Laterality: N/A;  . TUBAL LIGATION Bilateral 07/10/2018   Procedure: BILATERAL TUBAL LIGATION;  Surgeon: Conard Novak, MD;  Location: [facility];  Service: Obstetrics;  Laterality: Bilateral;    OB History    Gravida  3   Para  3   Term  3   Preterm      AB      Living  3     SAB      TAB      Ectopic      Multiple  0   Live Births  3            Home Medications    Prior to Admission medications   Medication Sig Start Date End Date Taking? Authorizing Provider  ibuprofen (ADVIL,MOTRIN) 600 MG tablet Take 1 tablet (600 mg total) by mouth every 6 (six) hours as needed for headache, mild pain, moderate pain or cramping. 07/12/18  Yes Farrel Conners, CNM  cetirizine-pseudoephedrine (ZYRTEC-D) 5-120 MG tablet Take 1 tablet by mouth 2 (two) times daily as needed for allergies or rhinitis. 12/23/18   Everlene Other G, DO  ipratropium (ATROVENT) 0.06 % nasal spray Place 2 sprays into both nostrils 4 (four) times daily as needed for rhinitis. 12/23/18   Tommie Sams, DO    Family History Family History  Problem Relation Age of Onset  . Cancer Mother   . Breast cancer Mother 37  . Breast cancer Maternal Grandmother   . Cancer Maternal Grandfather  2018 - liver  . Breast cancer Other        2018  . Breast cancer Other        2013  . Breast cancer Other        1990s    Social History Social History   Tobacco Use  . Smoking status: Never Smoker  . Smokeless tobacco: Never Used  Substance Use Topics  . Alcohol use: Yes    Alcohol/week: 0.0 standard drinks    Comment: occasionally  . Drug use: No     Allergies   Patient has no known allergies.   Review of Systems Review of Systems  Constitutional: Negative for fever.  HENT: Positive for congestion, rhinorrhea, sinus pressure and sinus pain.    Physical Exam Triage Vital Signs ED Triage Vitals  Enc Vitals Group     BP  12/23/18 1054 118/82     Pulse Rate 12/23/18 1054 91     Resp 12/23/18 1054 16     Temp 12/23/18 1054 98.6 F (37 C)     Temp Source 12/23/18 1054 Oral     SpO2 12/23/18 1054 100 %     Weight --      Height 12/23/18 1051 5\' 4"  (1.626 m)     Head Circumference --      Peak Flow --      Pain Score 12/23/18 1051 10     Pain Loc --      Pain Edu? --      Excl. in GC? --    Updated Vital Signs BP 118/82 (BP Location: Right Arm)   Pulse 91   Temp 98.6 F (37 C) (Oral)   Resp 16   Ht 5\' 4"  (1.626 m)   LMP 12/20/2018   SpO2 100%   Breastfeeding No   BMI 31.58 kg/m   Visual Acuity Right Eye Distance:   Left Eye Distance:   Bilateral Distance:    Right Eye Near:   Left Eye Near:    Bilateral Near:     Physical Exam Vitals signs and nursing note reviewed.  Constitutional:      General: She is not in acute distress.    Appearance: Normal appearance.  HENT:     Head: Normocephalic and atraumatic.     Nose: Congestion present. No rhinorrhea.  Eyes:     General:        Right eye: No discharge.        Left eye: No discharge.     Conjunctiva/sclera: Conjunctivae normal.  Cardiovascular:     Rate and Rhythm: Normal rate and regular rhythm.  Pulmonary:     Effort: Pulmonary effort is normal.     Breath sounds: Normal breath sounds.  Neurological:     Mental Status: She is alert.  Psychiatric:        Mood and Affect: Mood normal.        Behavior: Behavior normal.    UC Treatments / Results  Labs (all labs ordered are listed, but only abnormal results are displayed) Labs Reviewed - No data to display  EKG None  Radiology No results found.  Procedures Procedures (including critical care time)  Medications Ordered in UC Medications - No data to display  Initial Impression / Assessment and Plan / UC Course  I have reviewed the triage vital signs and the nursing notes.  Pertinent labs & imaging results that were available during my care of the patient were  reviewed by me and  considered in my medical decision making (see chart for details).    33 year old female presents with allergic rhinitis.  Treating with Atrovent and Zyrtec-D.  Work note given.  Final Clinical Impressions(s) / UC Diagnoses   Final diagnoses:  Allergic rhinitis due to pollen, unspecified seasonality   Discharge Instructions   None    ED Prescriptions    Medication Sig Dispense Auth. Provider   ipratropium (ATROVENT) 0.06 % nasal spray Place 2 sprays into both nostrils 4 (four) times daily as needed for rhinitis. 15 mL Hasheem Voland G, DO   cetirizine-pseudoephedrine (ZYRTEC-D) 5-120 MG tablet Take 1 tablet by mouth 2 (two) times daily as needed for allergies or rhinitis. 30 tablet Tommie Sams, DO     Controlled Substance Prescriptions [place] Controlled Substance Registry consulted? Not Applicable   Tommie Sams, DO 12/23/18 1134

## 2019-01-19 ENCOUNTER — Encounter: Payer: Self-pay | Admitting: Family Medicine

## 2019-01-19 ENCOUNTER — Ambulatory Visit (INDEPENDENT_AMBULATORY_CARE_PROVIDER_SITE_OTHER): Payer: 59 | Admitting: Family Medicine

## 2019-01-19 ENCOUNTER — Other Ambulatory Visit: Payer: Self-pay

## 2019-01-19 DIAGNOSIS — J302 Other seasonal allergic rhinitis: Secondary | ICD-10-CM

## 2019-01-19 DIAGNOSIS — N921 Excessive and frequent menstruation with irregular cycle: Secondary | ICD-10-CM | POA: Insufficient documentation

## 2019-01-19 DIAGNOSIS — N920 Excessive and frequent menstruation with regular cycle: Secondary | ICD-10-CM | POA: Insufficient documentation

## 2019-01-19 DIAGNOSIS — Z803 Family history of malignant neoplasm of breast: Secondary | ICD-10-CM | POA: Diagnosis not present

## 2019-01-19 MED ORDER — AZELASTINE HCL 0.1 % NA SOLN: 2.0000 | Freq: Two times a day (BID) | NASAL | 12 refills | Status: DC

## 2019-01-19 MED ORDER — MONTELUKAST SODIUM 10 MG PO TABS: 10.0000 mg | ORAL_TABLET | Freq: Every day | ORAL | 1 refills | Status: DC

## 2019-01-19 MED ORDER — FLUTICASONE PROPIONATE 50 MCG/ACT NA SUSP: 2.0000 | Freq: Every day | NASAL | 6 refills | Status: AC

## 2019-01-19 MED ORDER — ALBUTEROL SULFATE HFA 108 (90 BASE) MCG/ACT IN AERS: 2.0000 | INHALATION_SPRAY | Freq: Four times a day (QID) | RESPIRATORY_TRACT | 0 refills | Status: DC | PRN

## 2019-01-19 MED ORDER — LEVOCETIRIZINE DIHYDROCHLORIDE 5 MG PO TABS: 5.0000 mg | ORAL_TABLET | Freq: Every evening | ORAL | 1 refills | Status: DC

## 2019-01-19 NOTE — Patient Instructions (Signed)
[facility] - [phone]    

## 2019-01-19 NOTE — Progress Notes (Signed)
Name: Loretta Jensen   MRN: [id number]    DOB: 01/15/1986   Date:01/19/2019       Progress Note  Subjective  Chief Complaint  Chief Complaint  Patient presents with  . Establish Care  . Allergic Rhinitis     I connected with  Loretta Jensen  on 01/19/19 at 11:00 AM EDT by a video enabled telemedicine application and verified that I am speaking with the correct person using two identifiers.  I discussed the limitations of evaluation and management by telemedicine and the availability of in person appointments. The patient expressed understanding and agreed to proceed. Staff also discussed with the patient that there may be a patient responsible charge related to this service. Patient Location: Home Provider Location: Home Additional Individuals present: None  HPI  Pt prestens to establish care, used to see Dr. Sherley Bounds with our clinic several years ago.  She has a 27mo and 32yo at home.  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   Allergies: She has had them her whole life, feels like they are getting worse every year.  She ha nasal congestion and sinus headaches; occasional shortness of breath and wheezing, also has dry cough.  She has been taking zyrtec-d without relief; took [employer] severe sinus relief that worked a little bit.  Atrovent nasal spray is not working for her.   - COVID-19 Screening: Her symptoms have been present for over a month, are her typical allergy symptoms, no fevers, no fatigue.  Menorrhagia - she has had tubes tied after last pregnancy, since her menses resumed, she has had much heavier periods.  She has always had irregular periods. She states was told to avoid OCP due to risk of blood clots, I advised she follow up with Dr. Jean Rosenthal to determine if IUD or other method of bleeding control could be implemented.   Maternal History of Breast Cancer: Her mother died from breast cancer; she was  diagnosed at 33yo.  Maternal family history of Breast CA: Great grandmother, grandmorther, 2 great aunts; pt's mother was an only child.  Maternal grandfather's family also had history of breast cancer.   Patient Active Problem List   Diagnosis Date Noted  . Delivery of pregnancy by cesarean section 07/12/2018  . Postpartum care following cesarean delivery 07/12/2018  . Rh negative state in antepartum period 07/09/2018  . Nausea and vomiting during pregnancy 04/24/2018  . Obesity affecting pregnancy 03/04/2018  . BMI 34.0-34.9,adult 03/04/2018  . Migraine headache 01/23/2018  . Supervision of high risk pregnancy, antepartum 01/19/2018  . Short interval between pregnancies affecting pregnancy, antepartum 01/19/2018  . Status post cesarean delivery 03/02/2017  . History of cesarean delivery 12/04/2016  . Allergic otitis media 09/29/2015  . Occupational exposure to dust 05/23/2015  . Obesity, Class I, BMI 30-34.9 05/23/2015  . Influenza vaccination declined by patient 05/23/2015    Past Surgical History:  Procedure Laterality Date  . CESAREAN SECTION  2009  . CESAREAN SECTION N/A 03/02/2017   Procedure: CESAREAN SECTION;  Surgeon: Conard Novak, MD;  Location: [facility];  Service: Obstetrics;  Laterality: N/A;  . CESAREAN SECTION N/A 07/10/2018   Procedure: CESAREAN SECTION;  Surgeon: Conard Novak, MD;  Location: [facility];  Service: Obstetrics;  Laterality: N/A;  . TUBAL LIGATION Bilateral 07/10/2018   Procedure: BILATERAL TUBAL LIGATION;  Surgeon: Conard Novak, MD;  Location: [facility];  Service: Obstetrics;  Laterality: Bilateral;  Family History  Problem Relation Age of Onset  . Cancer Mother   . Breast cancer Mother 45  . Breast cancer Maternal Grandmother   . Cancer Maternal Grandfather        2018 - liver  . Breast cancer Other        2018  . Breast cancer Other        2013  . Breast cancer Other        1990s    Social History   Socioeconomic  History  . Marital status: Single    Spouse name: Not on file  . Number of children: 2  . Years of education: 62  . Highest education level: Not on file  Occupational History  . Occupation: laminate and crush foam    Comment: [employer]  Social Needs  . Financial resource strain: Not hard at all  . Food insecurity:    Worry: Never true    Inability: Never true  . Transportation needs:    Medical: No    Non-medical: No  Tobacco Use  . Smoking status: Never Smoker  . Smokeless tobacco: Never Used  Substance and Sexual Activity  . Alcohol use: Yes    Alcohol/week: 0.0 standard drinks    Comment: occasionally  . Drug use: No  . Sexual activity: Not Currently    Partners: Male    Birth control/protection: Surgical  Lifestyle  . Physical activity:    Days per week: 0 days    Minutes per session: 0 min  . Stress: Not at all  Relationships  . Social connections:    Talks on phone: More than three times a week    Gets together: More than three times a week    Attends religious service: More than 4 times per year    Active member of club or organization: No    Attends meetings of clubs or organizations: Never    Relationship status: Never married  . Intimate partner violence:    Fear of current or ex partner: No    Emotionally abused: No    Physically abused: No    Forced sexual activity: No  Other Topics Concern  . Not on file  Social History Narrative  . Not on file     Current Outpatient Medications:  .  cetirizine-pseudoephedrine (ZYRTEC-D) 5-120 MG tablet, Take 1 tablet by mouth 2 (two) times daily as needed for allergies or rhinitis., Disp: 30 tablet, Rfl: 0 .  ibuprofen (ADVIL,MOTRIN) 600 MG tablet, Take 1 tablet (600 mg total) by mouth every 6 (six) hours as needed for headache, mild pain, moderate pain or cramping., Disp: 30 tablet, Rfl: 1 .  ipratropium (ATROVENT) 0.06 % nasal spray, Place 2 sprays into both nostrils 4 (four) times daily as needed for rhinitis.,  Disp: 15 mL, Rfl: 0  No Known Allergies  I personally reviewed active problem list, medication list, allergies, family history, social history, health maintenance, notes from last encounter, lab results with the patient/caregiver today.   ROS  Constitutional: Negative for fever or weight change.  Respiratory: Negative for cough and shortness of breath.  See above for HEENT. Cardiovascular: Negative for chest pain or palpitations.  Gastrointestinal: Negative for abdominal pain, no bowel changes.  Musculoskeletal: Negative for gait problem or joint swelling.  Skin: Negative for rash.  Neurological: Negative for dizziness or headache.  No other specific complaints in a complete review of systems (except as listed in HPI above).   Objective  Virtual encounter, vitals  not obtained.  There is no height or weight on file to calculate BMI.  Physical Exam  Constitutional: Patient appears well-developed and well-nourished. No distress.  HENT: Head: Normocephalic and atraumatic.  Neck: Normal range of motion. Pulmonary/Chest: Effort normal. No respiratory distress. Speaking in complete sentences Neurological: Pt is alert and oriented to person, place, and time. Coordination, speech are normal.  Psychiatric: Patient has a normal mood and affect. behavior is normal. Judgment and thought content normal.  No results found for this or any previous visit (from the past 72 hour(s)).  PHQ2/9: [facility] 01/19/2019 09/29/2015 06/28/2015 05/23/2015  Decreased Interest 0 0 0 0  Down, Depressed, Hopeless 0 0 0 0  PHQ - 2 Score 0 0 0 0  Altered sleeping 0 - - -  Tired, decreased energy 0 - - -  Change in appetite 0 - - -  Feeling bad or failure about yourself  0 - - -  Trouble concentrating 0 - - -  Moving slowly or fidgety/restless 0 - - -  Suicidal thoughts 0 - - -  PHQ-9 Score 0 - - -  Difficult doing work/chores Not difficult at all - - -   PHQ-2/9 Result is negative.     Fall Risk: Fall Risk  01/19/2019 09/29/2015 06/28/2015 05/23/2015  Falls in the past year? 0 No No No  Number falls in past yr: 0 - - -  Injury with Fall? 0 - - -  Follow up Falls evaluation completed - - -    Assessment & Plan  1. Seasonal allergic rhinitis, unspecified trigger - Discussed black box warning for singulair in detail, she verbalizes understanding and will STOP medication if SE's occur. - fluticasone (FLONASE) 50 MCG/ACT nasal spray; Place 2 sprays into both nostrils daily.  Dispense: 16 g; Refill: 6 - azelastine (ASTELIN) 0.1 % nasal spray; Place 2 sprays into both nostrils 2 (two) times daily. Use in each nostril as directed  Dispense: 30 mL; Refill: 12 - levocetirizine (XYZAL) 5 MG tablet; Take 1 tablet (5 mg total) by mouth every evening.  Dispense: 90 tablet; Refill: 1 - montelukast (SINGULAIR) 10 MG tablet; Take 1 tablet (10 mg total) by mouth at bedtime.  Dispense: 90 tablet; Refill: 1 - albuterol (VENTOLIN HFA) 108 (90 Base) MCG/ACT inhaler; Inhale 2 puffs into the lungs every 6 (six) hours as needed for wheezing or shortness of breath.  Dispense: 1 Inhaler; Refill: 0  2. Family history of breast cancer in mother - Ambulatory referral to Genetics - MM 3D SCREEN BREAST BILATERAL; Future  3. Menorrhagia with irregular cycle - Advised to discuss with Dr. Jean RosenthalJackson with GYN.   I discussed the assessment and treatment plan with the patient. The patient was provided an opportunity to ask questions and all were answered. The patient agreed with the plan and demonstrated an understanding of the instructions.  The patient was advised to call back or seek an in-person evaluation if the symptoms worsen or if the condition fails to improve as anticipated.  I provided 27 minutes of non-face-to-face time during this encounter.

## 2019-01-21 ENCOUNTER — Telehealth: Payer: Self-pay | Admitting: Licensed Clinical Social Worker

## 2019-01-25 ENCOUNTER — Encounter: Payer: Self-pay | Admitting: Licensed Clinical Social Worker

## 2019-01-25 ENCOUNTER — Inpatient Hospital Stay: Payer: 59 | Attending: Oncology | Admitting: Licensed Clinical Social Worker

## 2019-01-25 DIAGNOSIS — Z803 Family history of malignant neoplasm of breast: Secondary | ICD-10-CM | POA: Diagnosis not present

## 2019-01-25 DIAGNOSIS — Z1379 Encounter for other screening for genetic and chromosomal anomalies: Secondary | ICD-10-CM

## 2019-01-25 NOTE — Telephone Encounter (Signed)
Offered virtual genetic counseling appointment. Patient agreed and we will meet via Webex today, 01/25/2019 at 3 pm.

## 2019-01-25 NOTE — Progress Notes (Signed)
REFERRING PROVIDER: Hubbard Hartshorn, [address] [place], [place] [address]  PRIMARY PROVIDER:  Hubbard Hartshorn, FNP  PRIMARY REASON FOR VISIT:  1. Family history of breast cancer    I connected with Ms. Montuori  on 01/25/2019 at 3:00 PM EDT by Webex and verified that I am speaking with the correct person using two identifiers.    Patient location: home Provider location: home  HISTORY OF PRESENT ILLNESS:   Loretta Jensen, a 33 y.o. female, was seen for a [facility] cancer genetics consultation at the request of Raelyn Ensign, FNP due to a family history of breast cancer.  Ms. Valdivia presents to clinic today to discuss the possibility of a hereditary predisposition to cancer, genetic testing, and to further clarify her future cancer risks, as well as potential cancer risks for family members.    Ms. Tony is a 33 y.o. female with no personal history of cancer.    RISK FACTORS:  Menarche was at age 64.  First live birth at age 40.  OCP use for approximately 2 years. Ovaries intact: Yes  Hysterectomy: No.  Menopausal status: Premenopausal. HRT use: 0 years.  Colonoscopy: No, not examined. Mammogram within the last year: No Up to date with pelvic exams: Yes Any excessive radiation exposure in the past: No  Past Medical History:  Diagnosis Date  . Allergy   . Family history of breast cancer   . Headache   . History of cesarean delivery 12/04/2016  . Obesity affecting pregnancy 03/04/2018  . Rh negative state in antepartum period 07/09/2018  . Short interval between pregnancies affecting pregnancy, antepartum 01/19/2018  . Status post cesarean delivery 03/02/2017  . Type O blood, Rh negative     Past Surgical History:  Procedure Laterality Date  . CESAREAN SECTION  2009  . CESAREAN SECTION N/A 03/02/2017   Procedure: CESAREAN SECTION;  Surgeon: Will Bonnet, MD;  Location: [facility] ORS;  Service: Obstetrics;  Laterality: N/A;  . CESAREAN SECTION N/A  07/10/2018   Procedure: CESAREAN SECTION;  Surgeon: Will Bonnet, MD;  Location: [facility] ORS;  Service: Obstetrics;  Laterality: N/A;  . TUBAL LIGATION Bilateral 07/10/2018   Procedure: BILATERAL TUBAL LIGATION;  Surgeon: Will Bonnet, MD;  Location: [facility] ORS;  Service: Obstetrics;  Laterality: Bilateral;    Social History   Socioeconomic History  . Marital status: Single    Spouse name: Not on file  . Number of children: 2  . Years of education: 19  . Highest education level: Not on file  Occupational History  . Occupation: laminate and crush foam    Comment: [employer]  Social Needs  . Financial resource strain: Not hard at all  . Food insecurity:    Worry: Never true    Inability: Never true  . Transportation needs:    Medical: No    Non-medical: No  Tobacco Use  . Smoking status: Never Smoker  . Smokeless tobacco: Never Used  Substance and Sexual Activity  . Alcohol use: Yes    Alcohol/week: 0.0 standard drinks    Comment: occasionally  . Drug use: No  . Sexual activity: Not Currently    Partners: Male    Birth control/protection: Surgical  Lifestyle  . Physical activity:    Days per week: 0 days    Minutes per session: 0 min  . Stress: Not at all  Relationships  . Social connections:    Talks on phone: More than three times a week  Gets together: More than three times a week    Attends religious service: More than 4 times per year    Active member of club or organization: No    Attends meetings of clubs or organizations: Never    Relationship status: Never married  Other Topics Concern  . Not on file  Social History Narrative  . Not on file     FAMILY HISTORY:  We obtained a detailed, 4-generation family history.  Significant diagnoses are listed below: Family History  Problem Relation Age of Onset  . Cancer Mother   . Breast cancer Mother 79  . Cancer Maternal Grandfather        2018 - liver  . Breast cancer Other 75       2018  . Breast  cancer Other 55       2013  . Breast cancer Other        1990s  . Cancer Paternal Grandmother        unk type  . Breast cancer Other    Ms. Sahakian has one son, 89 months old, and 2 daughters, one is age 92 and the other age 27. Ms. Bobrowski has one full brother. He is 48 with no cancer history. The patient also has 2 maternal half sisters and 1 maternal half brother, all living and no history of cancer. She has 3 paternal half brothers as well, no history of cancer she is aware of.  Ms. Oo mother had breast cancer at 56 and died at 31. Ms. Urquiza does not have maternal aunts or uncles. The patient's maternal grandmother is living at 69. This grandmother has 2 sisters, both have had breast cancer. One had breast cancer in her 99s, the other was recently diagnosed in her 44s. This great aunt had genetic testing that the patient says was positive, but she does not have any details. She will get the test report from her great aunt so I can share it with the lab. Ms. Daus maternal grandfather is living at 42 and has liver cancer. His sister died from breast cancer, and so did his mother.   Ms. Delmonico has limited information about her father's side. Her father is living at 18, no cancer she is aware of. She believes she has 4 paternal uncles, 1 paternal aunt. No information about her paternal cousins.  Her paternal grandmother had cancer and died around the age of 35, she unaware of cancer type. She has no information about her paternal grandfather.   Ms. Byland is aware of previous family history of genetic testing for hereditary cancer risks. Patient's maternal ancestors are of  [place] American descent, and paternal ancestors are of [place] descent. There is no reported Ashkenazi Jewish ancestry. There is no known consanguinity.  GENETIC COUNSELING ASSESSMENT: Ms. Hendricks is a 33 y.o. female with a family history which is somewhat suggestive of a hereditary cancer syndrome  and predisposition to cancer. We, therefore, discussed and recommended the following at today's visit.   DISCUSSION: We discussed that 5-10% of breast cancer is hereditary, with most cases associated with BRCA1/BRCA2  There are other genes that can be associated with hereditary breast cancer syndromes.  These include PALB2, CHEK2, ATM. We also discussed that there are other genes associated with other cancers that we can test for. We explained the importance of having her great aunt's test results so that we can share this information with the lab.  We reviewed the characteristics, features and inheritance patterns  of hereditary cancer syndromes. We also discussed genetic testing, including the appropriate family members to test, the process of testing, insurance coverage and turn-around-time for results. We discussed the implications of a negative, positive and/or variant of uncertain significant result. We recommended Ms. Kopec pursue genetic testing for the Multi-Cancer gene panel.   The Multi-Cancer Panel offered by [facility] includes sequencing and/or deletion duplication testing of the following 85 genes: AIP, ALK, APC, ATM, AXIN2,BAP1,  BARD1, BLM, BMPR1A, BRCA1, BRCA2, BRIP1, CASR, CDC73, CDH1, CDK4, CDKN1B, CDKN1C, CDKN2A (p14ARF), CDKN2A (p16INK4a), CEBPA, CHEK2, CTNNA1, DICER1, DIS3L2, EGFR (c.2369C>T, p.Thr790Met variant only), EPCAM (Deletion/duplication testing only), FH, FLCN, GATA2, GPC3, GREM1 (Promoter region deletion/duplication testing only), HOXB13 (c.251G>A, p.Gly84Glu), HRAS, KIT, MAX, MEN1, MET, MITF (c.952G>A, p.Glu318Lys variant only), MLH1, MSH2, MSH3, MSH6, MUTYH, NBN, NF1, NF2, NTHL1, PALB2, PDGFRA, PHOX2B, PMS2, POLD1, POLE, POT1, PRKAR1A, PTCH1, PTEN, RAD50, RAD51C, RAD51D, RB1, RECQL4, RET, RNF43, RUNX1, SDHAF2, SDHA (sequence changes only), SDHB, SDHC, SDHD, SMAD4, SMARCA4, SMARCB1, SMARCE1, STK11, SUFU, TERC, TERT, TMEM127, TP53, TSC1, TSC2, VHL, WRN and WT1.   Based on Ms.  Trimarco's family history of cancer, she meets medical criteria for genetic testing. Despite that she meets criteria, she may still have an out of pocket cost.  The lab will notify her of an OOP if any.   We discussed that some people do not want to undergo genetic testing due to fear of genetic discrimination.  A federal law called the Genetic Information Non-Discrimination Act (GINA) of 2008 helps protect individuals against genetic discrimination based on their genetic test results.  It impacts both health insurance and employment.  For health insurance, it protects against increased premiums, being kicked off insurance or being forced to take a test in order to be insured.  For employment it protects against hiring, firing and promoting decisions based on genetic test results.  Health status due to a cancer diagnosis is not protected under GINA.  This law does not protect life insurance, disability insurance, or other types of insurance.   PLAN: After considering the risks, benefits, and limitations, Ms. Starnes provided informed consent to pursue genetic testing. A saliva kit will be mailed to her house, and she will send her sample to [facility] for analysis of the Multi-Cancer Panel. Results should be available within approximately 2-3 weeks' time, at which point they will be disclosed by telephone to Ms. Kanno, as will any additional recommendations warranted by these results. Ms. Jester will receive a summary of her genetic counseling visit and a copy of her results once available. This information will also be available in Epic.   Based on Ms. Sydney's family history, we recommended her maternal relatives (her siblings) have genetic counseling and testing. Ms. Vader will let us know if we can be of any assistance in coordinating genetic counseling and/or testing for these family members.   Lastly, we encouraged Ms. Vora to remain in contact with cancer genetics annually so  that we can continuously update the family history and inform her of any changes in cancer genetics and testing that may be of benefit for this family.   Ms. Dethlefs questions were answered to her satisfaction today. Our contact information was provided should additional questions or concerns arise. Thank you for the referral and allowing [place] to share in the care of your patient.   Faith Rogue, MS Genetic Counselor [email or web address] Phone: [phone]  The patient was seen for a total of 40 minutes in face-to-face virtual genetic counseling.  Dr. Grayland Ormond  was available for discussion regarding this case.

## 2019-02-10 ENCOUNTER — Other Ambulatory Visit: Payer: Self-pay | Admitting: Family Medicine

## 2019-02-10 DIAGNOSIS — J302 Other seasonal allergic rhinitis: Secondary | ICD-10-CM

## 2019-04-19 ENCOUNTER — Encounter: Payer: Self-pay | Admitting: Family Medicine

## 2019-04-23 ENCOUNTER — Other Ambulatory Visit: Payer: Self-pay

## 2019-04-23 ENCOUNTER — Encounter: Payer: Self-pay | Admitting: Family Medicine

## 2019-04-23 ENCOUNTER — Ambulatory Visit (INDEPENDENT_AMBULATORY_CARE_PROVIDER_SITE_OTHER): Payer: 59 | Admitting: Family Medicine

## 2019-04-23 VITALS — BP 120/80 | HR 62 | Temp 96.9°F | Resp 16 | Ht 64.0 in | Wt 211.9 lb

## 2019-04-23 DIAGNOSIS — Z13220 Encounter for screening for lipoid disorders: Secondary | ICD-10-CM

## 2019-04-23 DIAGNOSIS — R6889 Other general symptoms and signs: Secondary | ICD-10-CM | POA: Diagnosis not present

## 2019-04-23 DIAGNOSIS — Z1159 Encounter for screening for other viral diseases: Secondary | ICD-10-CM

## 2019-04-23 DIAGNOSIS — L309 Dermatitis, unspecified: Secondary | ICD-10-CM

## 2019-04-23 DIAGNOSIS — R55 Syncope and collapse: Secondary | ICD-10-CM | POA: Diagnosis not present

## 2019-04-23 DIAGNOSIS — Z113 Encounter for screening for infections with a predominantly sexual mode of transmission: Secondary | ICD-10-CM

## 2019-04-23 DIAGNOSIS — Z114 Encounter for screening for human immunodeficiency virus [HIV]: Secondary | ICD-10-CM

## 2019-04-23 MED ORDER — TRIAMCINOLONE ACETONIDE 0.1 % EX CREA: 1.0000 "application " | TOPICAL_CREAM | Freq: Two times a day (BID) | CUTANEOUS | 0 refills | Status: DC

## 2019-04-23 NOTE — Progress Notes (Signed)
Name: Loretta Jensen   MRN: [id number]    DOB: January 21, 1986   Date:04/23/2019       Progress Note  Subjective  Chief Complaint  Chief Complaint  Patient presents with  . Overheating    HPI  Pt presents with concern for overheating at her job.  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  She works 10-12 hours a day for 5-6 days a week.   She is standing the whole time except for her 30 minute breaks.  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  She wears a t-shirt, basketball shorts to work to try to stay cool. - She notes she has gotten so hot/diaphoretic, that she now has a rash under her breasts.   - She is allowed to drink water/gatorade throughout the day. - There are fans on her some of the time, but not in every area; these do not seem to help.  She gets a break every 3 hours (30min each). - She notes getting migraines from the heat, having excessive diaphoresis, and having a few near-syncopal episodes.   Patient Active Problem List   Diagnosis Date Noted  . Family history of breast cancer   . Seasonal allergic rhinitis 01/19/2019  . Family history of breast cancer in mother 01/19/2019  . Menorrhagia with irregular cycle 01/19/2019  . Nausea and vomiting during pregnancy 04/24/2018  . BMI 34.0-34.9,adult 03/04/2018  . Migraine headache 01/23/2018  . Occupational exposure to dust 05/23/2015    Social History   Tobacco Use  . Smoking status: Never Smoker  . Smokeless tobacco: Never Used  Substance Use Topics  . Alcohol use: Yes    Alcohol/week: 0.0 standard drinks    Comment: occasionally     Current Outpatient Medications:  .  albuterol (VENTOLIN HFA) 108 (90 Base) MCG/ACT inhaler, INHALE 2 PUFFS INTO THE LUNGS EVERY 6 HOURS AS NEEDED FOR WHEEZING OR SHORTNESS OF BREATH, Disp: 6.7 g, Rfl: 1 .  azelastine (ASTELIN) 0.1 % nasal spray, Place 2 sprays into both nostrils 2 (two) times daily. Use in each nostril as directed, Disp: 30 mL, Rfl: 12 .  fluticasone  (FLONASE) 50 MCG/ACT nasal spray, Place 2 sprays into both nostrils daily., Disp: 16 g, Rfl: 6 .  ibuprofen (ADVIL,MOTRIN) 600 MG tablet, Take 1 tablet (600 mg total) by mouth every 6 (six) hours as needed for headache, mild pain, moderate pain or cramping., Disp: 30 tablet, Rfl: 1 .  levocetirizine (XYZAL) 5 MG tablet, Take 1 tablet (5 mg total) by mouth every evening., Disp: 90 tablet, Rfl: 1 .  montelukast (SINGULAIR) 10 MG tablet, Take 1 tablet (10 mg total) by mouth at bedtime., Disp: 90 tablet, Rfl: 1 .  ipratropium (ATROVENT) 0.06 % nasal spray, Place 2 sprays into both nostrils 4 (four) times daily as needed for rhinitis. (Patient not taking: Reported on 04/23/2019), Disp: 15 mL, Rfl: 0  No Known Allergies  I personally reviewed active problem list, medication list, allergies, notes from last encounter, lab results with the patient/caregiver today.  ROS  Ten systems reviewed and is negative except as mentioned in HPI  Objective  Vitals:   04/23/19 0709  BP: 120/80  Pulse: 62  Resp: 16  Temp: (!) 96.9 F (36.1 C)  TempSrc: Oral  SpO2: 99%  Weight: 211 lb 14.4 oz (96.1 kg)  Height: 5\' 4"  (1.626 m)   Body mass index is 36.37 kg/m.  Nursing Note and Vital Signs reviewed.  Physical Exam  Constitutional:  Patient appears well-developed and well-nourished. No distress.  HENT: Head: Normocephalic and atraumatic. Nose: Nose normal.  Eyes: Conjunctivae and EOM are normal. No scleral icterus.  Pupils are equal, round, and reactive to light.  Neck: Normal range of motion. Neck supple. No JVD present. No thyromegaly present.  Cardiovascular: Normal rate, regular rhythm and normal heart sounds.  No murmur heard. No BLE edema. Pulmonary/Chest: Effort normal and breath sounds normal. No respiratory distress. Musculoskeletal: Normal range of motion, no joint effusions. No gross deformities Neurological: Pt is alert and oriented to person, place, and time. No cranial nerve deficit.  Coordination, balance, strength, speech and gait are normal.  Skin: Skin is warm and dry. No erythema. There is hyperpigmentation to the upper abdomen/lower ribcage in the pattern of the overlying breasts.  The excoriation, erythema, or exudate. Psychiatric: Patient has a normal mood and affect. behavior is normal. Judgment and thought content normal.  No results found for this or any previous visit (from the past 72 hour(s)).  Assessment & Plan  1. Heat intolerance - COMPLETE METABOLIC PANEL WITH GFR - CBC with Differential/Platelet - TSH - Encouraged her to stay hydrated, have snacks throughout the day that she can tolerate while at work.  We will check labs per orders today.   2. Near syncope - COMPLETE METABOLIC PANEL WITH GFR - CBC with Differential/Platelet - TSH  3. Lipid screening - Lipid panel  4. Encounter for screening for HIV - HIV Antibody (routine testing w rflx)  5. Routine screening for STI (sexually transmitted infection) - RPR - HIV Antibody (routine testing w rflx)  6. Encounter for hepatitis C screening test for low risk patient - Hepatitis C antibody  7. Eczema, unspecified type - Advised possibly fungal, but she has history of eczema under the breast per her report, so we will start with steroidal cream - will call back if worsening/not improving and will send in antifungal - triamcinolone cream (KENALOG) 0.1 %; Apply 1 application topically 2 (two) times daily.  Dispense: 30 g; Refill: 0   -Red flags and when to present for emergency care or RTC including fever >101.74F, chest pain, shortness of breath, new/worsening/un-resolving symptoms, reviewed with patient at time of visit. Follow up and care instructions discussed and provided in AVS.

## 2019-04-26 LAB — CBC WITH DIFFERENTIAL/PLATELET
Absolute Monocytes: 482 cells/uL (ref 200–950)
Basophils Absolute: 22 cells/uL (ref 0–200)
Basophils Relative: 0.4 %
Eosinophils Absolute: 129 cells/uL (ref 15–500)
Eosinophils Relative: 2.3 %
HCT: 36.2 % (ref 35.0–45.0)
Hemoglobin: 11.7 g/dL (ref 11.7–15.5)
Lymphs Abs: 2100 cells/uL (ref 850–3900)
MCH: 25.7 pg — ABNORMAL LOW (ref 27.0–33.0)
MCHC: 32.3 g/dL (ref 32.0–36.0)
MCV: 79.4 fL — ABNORMAL LOW (ref 80.0–100.0)
MPV: 9.4 fL (ref 7.5–12.5)
Monocytes Relative: 8.6 %
Neutro Abs: 2867 cells/uL (ref 1500–7800)
Neutrophils Relative %: 51.2 %
Platelets: 366 10*3/uL (ref 140–400)
RBC: 4.56 10*6/uL (ref 3.80–5.10)
RDW: 13.8 % (ref 11.0–15.0)
Total Lymphocyte: 37.5 %
WBC: 5.6 10*3/uL (ref 3.8–10.8)

## 2019-04-26 LAB — COMPLETE METABOLIC PANEL WITH GFR
AG Ratio: 1.5 (calc) (ref 1.0–2.5)
ALT: 17 U/L (ref 6–29)
AST: 14 U/L (ref 10–30)
Albumin: 4 g/dL (ref 3.6–5.1)
Alkaline phosphatase (APISO): 54 U/L (ref 31–125)
BUN: 11 mg/dL (ref 7–25)
CO2: 24 mmol/L (ref 20–32)
Calcium: 8.7 mg/dL (ref 8.6–10.2)
Chloride: 107 mmol/L (ref 98–110)
Creat: 0.67 mg/dL (ref 0.50–1.10)
GFR, Est African American: 136 mL/min/{1.73_m2} (ref >=60)
GFR, Est Non African American: 117 mL/min/{1.73_m2} (ref >=60)
Globulin: 2.6 g/dL (calc) (ref 1.9–3.7)
Glucose, Bld: 108 mg/dL — ABNORMAL HIGH (ref 65–99)
Potassium: 4.2 mmol/L (ref 3.5–5.3)
Sodium: 138 mmol/L (ref 135–146)
Total Bilirubin: 0.2 mg/dL (ref 0.2–1.2)
Total Protein: 6.6 g/dL (ref 6.1–8.1)

## 2019-04-26 LAB — LIPID PANEL
Cholesterol: 113 mg/dL (ref <200)
HDL: 47 mg/dL — ABNORMAL LOW (ref >=50)
LDL Cholesterol (Calc): 52 mg/dL (calc)
Non-HDL Cholesterol (Calc): 66 mg/dL (calc) (ref <130)
Total CHOL/HDL Ratio: 2.4 (calc) (ref <5.0)
Triglycerides: 68 mg/dL (ref <150)

## 2019-04-26 LAB — TSH: TSH: 1.47 mIU/L

## 2019-04-26 LAB — HEPATITIS C ANTIBODY
Hepatitis C Ab: NONREACTIVE
SIGNAL TO CUT-OFF: 0.02 (ref <1.00)

## 2019-04-26 LAB — HIV ANTIBODY (ROUTINE TESTING W REFLEX): HIV 1&2 Ab, 4th Generation: NONREACTIVE

## 2019-04-26 LAB — RPR: RPR Ser Ql: NONREACTIVE

## 2019-04-28 ENCOUNTER — Other Ambulatory Visit: Payer: Self-pay

## 2019-04-28 DIAGNOSIS — Z20822 Contact with and (suspected) exposure to covid-19: Secondary | ICD-10-CM

## 2019-04-30 LAB — NOVEL CORONAVIRUS, NAA: SARS-CoV-2, NAA: NOT DETECTED

## 2019-05-03 ENCOUNTER — Encounter: Payer: Self-pay | Admitting: Family Medicine

## 2019-05-13 ENCOUNTER — Encounter: Payer: Self-pay | Admitting: Family Medicine

## 2019-05-13 ENCOUNTER — Ambulatory Visit (INDEPENDENT_AMBULATORY_CARE_PROVIDER_SITE_OTHER): Payer: 59 | Admitting: Family Medicine

## 2019-05-13 ENCOUNTER — Other Ambulatory Visit: Payer: Self-pay

## 2019-05-13 DIAGNOSIS — J302 Other seasonal allergic rhinitis: Secondary | ICD-10-CM

## 2019-05-13 MED ORDER — LORATADINE 10 MG PO TABS: 10.0000 mg | ORAL_TABLET | ORAL | 2 refills | Status: DC

## 2019-05-13 MED ORDER — CETIRIZINE HCL 10 MG PO TABS: 10.0000 mg | ORAL_TABLET | Freq: Every day | ORAL | 2 refills | Status: DC

## 2019-05-13 MED ORDER — OLOPATADINE HCL 0.2 % OP SOLN: 1.0000 [drp] | Freq: Every day | OPHTHALMIC | 3 refills | Status: DC

## 2019-05-13 MED ORDER — ALBUTEROL SULFATE HFA 108 (90 BASE) MCG/ACT IN AERS: INHALATION_SPRAY | RESPIRATORY_TRACT | 3 refills | Status: DC

## 2019-05-13 MED ORDER — BENZONATATE 100 MG PO CAPS: 100.0000 mg | ORAL_CAPSULE | Freq: Two times a day (BID) | ORAL | 1 refills | Status: DC | PRN

## 2019-05-13 NOTE — Progress Notes (Signed)
Name: Loretta Jensen   MRN: [id number]    DOB: 07-Oct-1985   Date:05/13/2019       Progress Note  Subjective  Chief Complaint  Chief Complaint  Patient presents with  . Eye Problem    I connected with  Joyous Rymer  on 05/13/19 at  7:20 AM EDT by a video enabled telemedicine application and verified that I am speaking with the correct person using two identifiers.  I discussed the limitations of evaluation and management by telemedicine and the availability of in person appointments. The patient expressed understanding and agreed to proceed. Staff also discussed with the patient that there may be a patient responsible charge related to this service. Patient Location: Home Provider Location: Office Additional Individuals present: None  HPI  Pt presents with concern for eye issue - Loretta Jensen right eye was red for about 2 days, but never had any crusting or drainage.  She never had any vision changes.  Did have some eye discomfort during the time, however symptoms have completely resolved.  She also notes significant allergies, with sore throat and cough along with significant PND.  She has been on Xyzal for about 4 months.  She has not had an official diagnosis of asthma, though she keeps an albuterol inhaler on hand to use for wheezing and coughing. - Did test negative for COVID-19 recently. Denies chest tightness, shortness of breath, fever.    Patient Active Problem List   Diagnosis Date Noted  . Family history of breast cancer   . Seasonal allergic rhinitis 01/19/2019  . Family history of breast cancer in mother 01/19/2019  . Menorrhagia with irregular cycle 01/19/2019  . Nausea and vomiting during pregnancy 04/24/2018  . BMI 34.0-34.9,adult 03/04/2018  . Migraine headache 01/23/2018  . Occupational exposure to dust 05/23/2015    Past Surgical History:  Procedure Laterality Date  . CESAREAN SECTION  2009  . CESAREAN SECTION N/A 03/02/2017   Procedure: CESAREAN SECTION;   Surgeon: Will Bonnet, MD;  Location: [facility];  Service: Obstetrics;  Laterality: N/A;  . CESAREAN SECTION N/A 07/10/2018   Procedure: CESAREAN SECTION;  Surgeon: Will Bonnet, MD;  Location: [facility];  Service: Obstetrics;  Laterality: N/A;  . TUBAL LIGATION Bilateral 07/10/2018   Procedure: BILATERAL TUBAL LIGATION;  Surgeon: Will Bonnet, MD;  Location: [facility];  Service: Obstetrics;  Laterality: Bilateral;    Family History  Problem Relation Age of Onset  . Cancer Mother   . Breast cancer Mother 58  . Cancer Maternal Grandfather        2018 - liver  . Breast cancer Other 75       2018  . Breast cancer Other 55       2013  . Breast cancer Other        1990s  . Cancer Paternal Grandmother        unk type  . Breast cancer Other     Social History   Socioeconomic History  . Marital status: Single    Spouse name: Not on file  . Number of children: 3  . Years of education: 6  . Highest education level: Not on file  Occupational History  . Occupation: laminate and crush [employer]    Comment: [employer]  Social Needs  . Financial resource strain: Not hard at all  . Food insecurity    Worry: Never true    Inability: Never true  . Transportation needs    Medical: No  Non-medical: No  Tobacco Use  . Smoking status: Never Smoker  . Smokeless tobacco: Never Used  Substance and Sexual Activity  . Alcohol use: Yes    Alcohol/week: 0.0 standard drinks    Comment: occasionally  . Drug use: No  . Sexual activity: Yes    Partners: Male    Birth control/protection: Surgical  Lifestyle  . Physical activity    Days per week: 0 days    Minutes per session: 0 min  . Stress: Not at all  Relationships  . Social connections    Talks on phone: More than three times a week    Gets together: More than three times a week    Attends religious service: More than 4 times per year    Active member of club or organization: No    Attends meetings of clubs or  organizations: Never    Relationship status: Never married  . Intimate partner violence    Fear of current or ex partner: No    Emotionally abused: No    Physically abused: No    Forced sexual activity: No  Other Topics Concern  . Not on file  Social History Narrative  . Not on file     Current Outpatient Medications:  .  albuterol (VENTOLIN HFA) 108 (90 Base) MCG/ACT inhaler, INHALE 2 PUFFS INTO THE LUNGS EVERY 6 HOURS AS NEEDED FOR WHEEZING OR SHORTNESS OF BREATH, Disp: 6.7 g, Rfl: 1 .  azelastine (ASTELIN) 0.1 % nasal spray, Place 2 sprays into both nostrils 2 (two) times daily. Use in each nostril as directed, Disp: 30 mL, Rfl: 12 .  fluticasone (FLONASE) 50 MCG/ACT nasal spray, Place 2 sprays into both nostrils daily., Disp: 16 g, Rfl: 6 .  ibuprofen (ADVIL,MOTRIN) 600 MG tablet, Take 1 tablet (600 mg total) by mouth every 6 (six) hours as needed for headache, mild pain, moderate pain or cramping., Disp: 30 tablet, Rfl: 1 .  levocetirizine (XYZAL) 5 MG tablet, Take 1 tablet (5 mg total) by mouth every evening., Disp: 90 tablet, Rfl: 1 .  montelukast (SINGULAIR) 10 MG tablet, Take 1 tablet (10 mg total) by mouth at bedtime., Disp: 90 tablet, Rfl: 1 .  triamcinolone cream (KENALOG) 0.1 %, Apply 1 application topically 2 (two) times daily., Disp: 30 g, Rfl: 0  No Known Allergies  I personally reviewed active problem list, medication list, allergies, notes from last encounter, lab results with the patient/caregiver today.   ROS  Constitutional: Negative for fever or weight change.  Respiratory: Negative for cough and shortness of breath.   Cardiovascular: Negative for chest pain or palpitations.  Gastrointestinal: Negative for abdominal pain, no bowel changes.  Musculoskeletal: Negative for gait problem or joint swelling.  Skin: Negative for rash.  Neurological: Negative for dizziness or headache.  No other specific complaints in a complete review of systems (except as listed in  HPI above).  Objective  Virtual encounter, vitals not obtained.  There is no height or weight on file to calculate BMI.  Physical Exam Constitutional: Patient appears well-developed and well-nourished. No distress.  HENT: Head: Normocephalic and atraumatic.  Neck: Normal range of motion. Pulmonary/Chest: Effort normal. No respiratory distress. Speaking in complete sentences Neurological: Pt is alert and oriented to person, place, and time. Coordination, speech are normal.  Psychiatric: Patient has a normal mood and affect. behavior is normal. Judgment and thought content normal.  No results found for this or any previous visit (from the past 72 hour(s)).  PHQ2/9: Depression  screen [facility] 05/13/2019 04/23/2019 01/19/2019 09/29/2015 06/28/2015  Decreased Interest 0 0 0 0 0  Down, Depressed, Hopeless 0 0 0 0 0  PHQ - 2 Score 0 0 0 0 0  Altered sleeping 0 0 0 - -  Tired, decreased energy 0 0 0 - -  Change in appetite 0 0 0 - -  Feeling bad or failure about yourself  0 0 0 - -  Trouble concentrating 0 0 0 - -  Moving slowly or fidgety/restless 0 0 0 - -  Suicidal thoughts 0 0 0 - -  PHQ-9 Score 0 0 0 - -  Difficult doing work/chores - Not difficult at all Not difficult at all - -   PHQ-2/9 Result is negative.    Fall Risk: Fall Risk  05/13/2019 04/23/2019 01/19/2019 09/29/2015 06/28/2015  Falls in the past year? 0 0 0 No No  Number falls in past yr: 0 0 0 - -  Injury with Fall? 0 0 0 - -  Follow up - Falls evaluation completed Falls evaluation completed - -    Assessment & Plan  1. Seasonal allergic rhinitis, unspecified trigger - Lengthy discussion regarding allergies - we will have Loretta Jensen alternate every 3 months between Xyzal and Zyrtec QHS.  We will also add Claritin QAM.  Continue nasal sprays and singulair.  Tessalon PRN for cough.  Monitor respiratory symptoms - if wheezing or shortness of breath, will send for lung function studies. - Olopatadine HCl 0.2 % SOLN; Apply 1 drop to  eye daily.  Dispense: 2.5 mL; Refill: 3 - cetirizine (ZYRTEC) 10 MG tablet; Take 1 tablet (10 mg total) by mouth at bedtime.  Dispense: 90 tablet; Refill: 2 - loratadine (CLARITIN) 10 MG tablet; Take 1 tablet (10 mg total) by mouth every morning.  Dispense: 90 tablet; Refill: 2 - albuterol (VENTOLIN HFA) 108 (90 Base) MCG/ACT inhaler; INHALE 2 PUFFS INTO THE LUNGS EVERY 6 HOURS AS NEEDED FOR WHEEZING OR SHORTNESS OF BREATH  Dispense: 18 g; Refill: 3 - benzonatate (TESSALON) 100 MG capsule; Take 1 capsule (100 mg total) by mouth 2 (two) times daily as needed for cough.  Dispense: 30 capsule; Refill: 1  I discussed the assessment and treatment plan with the patient. The patient was provided an opportunity to ask questions and all were answered. The patient agreed with the plan and demonstrated an understanding of the instructions.  The patient was advised to call back or seek an in-person evaluation if the symptoms worsen or if the condition fails to improve as anticipated.  I provided 13 minutes of non-face-to-face time during this encounter.

## 2019-05-20 ENCOUNTER — Encounter: Payer: Self-pay | Admitting: Family Medicine

## 2019-05-20 ENCOUNTER — Encounter (INDEPENDENT_AMBULATORY_CARE_PROVIDER_SITE_OTHER): Payer: Self-pay

## 2019-05-20 ENCOUNTER — Other Ambulatory Visit: Payer: Self-pay

## 2019-05-20 ENCOUNTER — Telehealth: Payer: Self-pay

## 2019-05-20 ENCOUNTER — Ambulatory Visit (INDEPENDENT_AMBULATORY_CARE_PROVIDER_SITE_OTHER): Payer: 59 | Admitting: Family Medicine

## 2019-05-20 DIAGNOSIS — R05 Cough: Secondary | ICD-10-CM

## 2019-05-20 DIAGNOSIS — Z20822 Contact with and (suspected) exposure to covid-19: Secondary | ICD-10-CM

## 2019-05-20 DIAGNOSIS — J989 Respiratory disorder, unspecified: Secondary | ICD-10-CM

## 2019-05-20 DIAGNOSIS — R059 Cough, unspecified: Secondary | ICD-10-CM

## 2019-05-20 DIAGNOSIS — R6889 Other general symptoms and signs: Secondary | ICD-10-CM

## 2019-05-20 DIAGNOSIS — R0602 Shortness of breath: Secondary | ICD-10-CM

## 2019-05-20 MED ORDER — BUDESONIDE-FORMOTEROL FUMARATE 80-4.5 MCG/ACT IN AERO: 2.0000 | INHALATION_SPRAY | Freq: Two times a day (BID) | RESPIRATORY_TRACT | 3 refills | Status: DC

## 2019-05-20 NOTE — Telephone Encounter (Signed)
BPA triggered in My [facility]ion questionnaire by worsening cough. Per chart patient has addressed the issue with Raelyn Ensign NP in virtual visit today.

## 2019-05-20 NOTE — Progress Notes (Signed)
Name: Loretta Jensen   MRN: [id number]    DOB: [birth year]   Date:05/20/2019       Progress Note  Subjective  Chief Complaint  Chief Complaint  Patient presents with  . Cough    headache, dizzy, sob    I connected with  Loretta Jensen  on 05/20/19 at  2:20 PM EDT by a video enabled telemedicine application and verified that I am speaking with the correct person using two identifiers.  I discussed the limitations of evaluation and management by telemedicine and the availability of in person appointments. The patient expressed understanding and agreed to proceed. Staff also discussed with the patient that there may be a patient responsible charge related to this service. Patient Location: Home Provider Location: Office Additional Individuals present: None  HPI  Pt presents with concern for nasal congestion, dry cough, headache, some dizziness for about 2-3 days.  She does work in a [place] [employer], no known [place] cases.  Was negative on 04/28/2019.  Denies fevers/chills, NVD, abdominal pain, no taste/smell disturbance.  She is taking nyquil with Vicks and guaifenesin; she is also taking singulair and zyrtec.  She has been using tessalon perles for cough and it does help a little; she is also using albuterol - but is needing Q4H.     Patient Active Problem List   Diagnosis Date Noted  . Family history of breast cancer   . Seasonal allergic rhinitis 01/19/2019  . Family history of breast cancer in mother 01/19/2019  . Menorrhagia with irregular cycle 01/19/2019  . Nausea and vomiting during pregnancy 04/24/2018  . BMI 34.0-34.9,adult 03/04/2018  . Migraine headache 01/23/2018  . Occupational exposure to dust 05/23/2015    Social History   Tobacco Use  . Smoking status: Never Smoker  . Smokeless tobacco: Never Used  Substance Use Topics  . Alcohol use: Yes    Alcohol/week: 0.0 standard drinks    Comment: occasionally     Current Outpatient Medications:  .  albuterol  (VENTOLIN HFA) 108 (90 Base) MCG/ACT inhaler, INHALE 2 PUFFS INTO THE LUNGS EVERY 6 HOURS AS NEEDED FOR WHEEZING OR SHORTNESS OF BREATH, Disp: 18 g, Rfl: 3 .  azelastine (ASTELIN) 0.1 % nasal spray, Place 2 sprays into both nostrils 2 (two) times daily. Use in each nostril as directed, Disp: 30 mL, Rfl: 12 .  benzonatate (TESSALON) 100 MG capsule, Take 1 capsule (100 mg total) by mouth 2 (two) times daily as needed for cough., Disp: 30 capsule, Rfl: 1 .  fluticasone (FLONASE) 50 MCG/ACT nasal spray, Place 2 sprays into both nostrils daily., Disp: 16 g, Rfl: 6 .  ibuprofen (ADVIL,MOTRIN) 600 MG tablet, Take 1 tablet (600 mg total) by mouth every 6 (six) hours as needed for headache, mild pain, moderate pain or cramping., Disp: 30 tablet, Rfl: 1 .  loratadine (CLARITIN) 10 MG tablet, Take 1 tablet (10 mg total) by mouth every morning., Disp: 90 tablet, Rfl: 2 .  cetirizine (ZYRTEC) 10 MG tablet, Take 1 tablet (10 mg total) by mouth at bedtime. (Patient not taking: Reported on 05/20/2019), Disp: 90 tablet, Rfl: 2 .  levocetirizine (XYZAL) 5 MG tablet, Take 1 tablet (5 mg total) by mouth every evening. (Patient not taking: Reported on 05/20/2019), Disp: 90 tablet, Rfl: 1 .  montelukast (SINGULAIR) 10 MG tablet, Take 1 tablet (10 mg total) by mouth at bedtime. (Patient not taking: Reported on 05/20/2019), Disp: 90 tablet, Rfl: 1 .  Olopatadine HCl 0.2 % SOLN, Apply 1 drop to  eye daily. (Patient not taking: Reported on 05/20/2019), Disp: 2.5 mL, Rfl: 3 .  triamcinolone cream (KENALOG) 0.1 %, Apply 1 application topically 2 (two) times daily. (Patient not taking: Reported on 05/20/2019), Disp: 30 g, Rfl: 0  No Known Allergies  I personally reviewed active problem list, medication list, allergies, notes from last encounter with the patient/caregiver today.  ROS  Ten systems reviewed and is negative except as mentioned in HPI   Objective  Virtual encounter, vitals not obtained.  There is no height or  weight on file to calculate BMI.  Nursing Note and Vital Signs reviewed.  Physical Exam  Constitutional: Patient appears well-developed and well-nourished. No distress.  HENT: Head: Normocephalic and atraumatic.  Neck: Normal range of motion. Pulmonary/Chest: Effort normal. No respiratory distress. Speaking in complete sentences. Dry cough throughout examination Neurological: Pt is alert and oriented to person, place, and time. Coordination, speech are normal.  Psychiatric: Patient has a normal mood and affect. behavior is normal. Judgment and thought content normal.  No results found for this or any previous visit (from the past 72 hour(s)).  Assessment & Plan  1. Cough - Continue zrtec, singulair, and PRN albuterol - MYCHART COVID-19 HOME MONITORING PROGRAM - Novel Coronavirus, NAA ([facility]) - budesonide-formoterol (SYMBICORT) 80-4.5 MCG/ACT inhaler; Inhale 2 puffs into the lungs 2 (two) times daily.  Dispense: 1 Inhaler; Refill: 3 - Temperature monitoring; Future  2. Shortness of breath - MYCHART COVID-19 HOME MONITORING PROGRAM - Novel Coronavirus, NAA ([facility]) - budesonide-formoterol (SYMBICORT) 80-4.5 MCG/ACT inhaler; Inhale 2 puffs into the lungs 2 (two) times daily.  Dispense: 1 Inhaler; Refill: 3 - Temperature monitoring; Future  3. Respiratory illness - MYCHART COVID-19 HOME MONITORING PROGRAM - Novel Coronavirus, NAA ([facility]) - budesonide-formoterol (SYMBICORT) 80-4.5 MCG/ACT inhaler; Inhale 2 puffs into the lungs 2 (two) times daily.  Dispense: 1 Inhaler; Refill: 3 - Temperature monitoring; Future  4. 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 Follow up and care instructions discussed and provided in AVS. - I discussed the assessment and treatment plan with the patient. The patient was provided an opportunity to ask questions and all were answered. The patient agreed with the plan and demonstrated an understanding of the instructions.  I provided 12 minutes of non-face-to-face time during this encounter.  Doren CustardEmily E Makinsey Pepitone, FNP

## 2019-05-21 ENCOUNTER — Encounter (INDEPENDENT_AMBULATORY_CARE_PROVIDER_SITE_OTHER): Payer: Self-pay

## 2019-05-22 LAB — NOVEL CORONAVIRUS, NAA: SARS-CoV-2, NAA: NOT DETECTED

## 2019-05-23 ENCOUNTER — Encounter (INDEPENDENT_AMBULATORY_CARE_PROVIDER_SITE_OTHER): Payer: Self-pay

## 2019-05-24 ENCOUNTER — Encounter (INDEPENDENT_AMBULATORY_CARE_PROVIDER_SITE_OTHER): Payer: Self-pay

## 2019-05-26 ENCOUNTER — Encounter (INDEPENDENT_AMBULATORY_CARE_PROVIDER_SITE_OTHER): Payer: Self-pay

## 2019-07-09 ENCOUNTER — Other Ambulatory Visit: Payer: Self-pay

## 2019-07-09 ENCOUNTER — Encounter: Payer: Self-pay | Admitting: Family Medicine

## 2019-07-09 ENCOUNTER — Ambulatory Visit (INDEPENDENT_AMBULATORY_CARE_PROVIDER_SITE_OTHER): Payer: 59 | Admitting: Family Medicine

## 2019-07-09 ENCOUNTER — Telehealth: Payer: Self-pay | Admitting: Family Medicine

## 2019-07-09 VITALS — BP 126/72 | HR 86 | Temp 97.3°F | Resp 16 | Ht 64.0 in | Wt 210.0 lb

## 2019-07-09 DIAGNOSIS — G8929 Other chronic pain: Secondary | ICD-10-CM

## 2019-07-09 DIAGNOSIS — N921 Excessive and frequent menstruation with irregular cycle: Secondary | ICD-10-CM

## 2019-07-09 DIAGNOSIS — Z0000 Encounter for general adult medical examination without abnormal findings: Secondary | ICD-10-CM | POA: Diagnosis not present

## 2019-07-09 DIAGNOSIS — Z23 Encounter for immunization: Secondary | ICD-10-CM

## 2019-07-09 DIAGNOSIS — M545 Low back pain: Secondary | ICD-10-CM

## 2019-07-09 DIAGNOSIS — J302 Other seasonal allergic rhinitis: Secondary | ICD-10-CM | POA: Diagnosis not present

## 2019-07-09 DIAGNOSIS — G43709 Chronic migraine without aura, not intractable, without status migrainosus: Secondary | ICD-10-CM

## 2019-07-09 MED ORDER — TIZANIDINE HCL 4 MG PO TABS: 2.0000 mg | ORAL_TABLET | Freq: Three times a day (TID) | ORAL | 0 refills | Status: DC | PRN

## 2019-07-09 NOTE — Telephone Encounter (Signed)
Faith Rogue, MS Genetic Counselor [email or web address] Phone: [phone]  Patient was informed.

## 2019-07-09 NOTE — Telephone Encounter (Signed)
Pt missed call with geneticist for breast cancer family history - can you please provide her with the phone number so that she can call and schedule? I can't find in the referrals notes. Thanks!

## 2019-07-09 NOTE — Patient Instructions (Addendum)
Chronic Pain, Adult Chronic pain is a type of pain that lasts or keeps coming back (recurs) for at least six months. You may have chronic headaches, abdominal pain, or body pain. Chronic pain may be related to an illness, such as fibromyalgia or complex regional pain syndrome. Sometimes the cause of chronic pain is not known. Chronic pain can make it hard for you to do daily activities. If not treated, chronic pain can lead to other health problems, including anxiety and depression. Treatment depends on the cause and severity of your pain. You may need to work with a pain specialist to come up with a treatment plan. The plan may include medicine, counseling, and physical therapy. Many people benefit from a combination of two or more types of treatment to control their pain. Follow these instructions at home: Lifestyle  Consider keeping a pain diary to share with your health care providers.  Consider talking with a mental health care provider (psychologist) about how to cope with chronic pain.  Consider joining a chronic pain support group.  Try to control or lower your stress levels. Talk to your health care provider about strategies to do this. General instructions   Take over-the-counter and prescription medicines only as told by your health care provider.  Follow your treatment plan as told by your health care provider. This may include: ? Gentle, regular exercise. ? Eating a healthy diet that includes foods such as vegetables, fruits, fish, and lean meats. ? Cognitive or behavioral therapy. ? Working with a Community education officer. ? Meditation or yoga. ? Acupuncture or massage therapy. ? Aroma, color, light, or sound therapy. ? Local electrical stimulation. ? Shots (injections) of numbing or pain-relieving medicines into the spine or the area of pain.  Check your pain level as told by your health care provider. Ask your health care provider if you should use a pain scale.  Learn as  much as you can about how to manage your chronic pain. Ask your health care provider if an intensive pain rehabilitation program or a chronic pain specialist would be helpful.  Keep all follow-up visits as told by your health care provider. This is important. Contact a health care provider if:  Your pain gets worse.  You have new pain.  You have trouble sleeping.  You have trouble doing your normal activities.  Your pain is not controlled with treatment.  Your have side effects from pain medicine.  You feel weak. Get help right away if:  You lose feeling or have numbness in your body.  You lose control of bowel or bladder function.  Your pain suddenly gets much worse.  You develop shaking or chills.  You develop confusion.  You develop chest pain.  You have trouble breathing or shortness of breath.  You pass out.  You have thoughts about hurting yourself or others. This information is not intended to replace advice given to you by your health care provider. Make sure you discuss any questions you have with your health care provider. Document Released: 06/01/2002 Document Revised: 08/22/2017 Document Reviewed: 02/27/2016 Elsevier Patient Education  2020 [place] 32-32 Years Old, Female Preventive care refers to visits with your health care provider and lifestyle choices that can promote health and wellness. This includes:  A yearly physical exam. This may also be called an annual well check.  Regular dental visits and eye exams.  Immunizations.  Screening for certain conditions.  Healthy lifestyle choices, such as eating a healthy diet,  getting regular exercise, not using drugs or products that contain nicotine and tobacco, and limiting alcohol use. What can I expect for my preventive care visit? Physical exam Your health care provider will check your:  Height and weight. This may be used to calculate body mass index (BMI), which tells if  you are at a healthy weight.  Heart rate and blood pressure.  Skin for abnormal spots. Counseling Your health care provider may ask you questions about your:  Alcohol, tobacco, and drug use.  Emotional well-being.  Home and relationship well-being.  Sexual activity.  Eating habits.  Work and work Statistician.  Method of birth control.  Menstrual cycle.  Pregnancy history. What immunizations do I need?  Influenza (flu) vaccine  This is recommended every year. Tetanus, diphtheria, and pertussis (Tdap) vaccine  You may need a Td booster every 10 years. Varicella (chickenpox) vaccine  You may need this if you have not been vaccinated. Human papillomavirus (HPV) vaccine  If recommended by your health care provider, you may need three doses over 6 months. Measles, mumps, and rubella (MMR) vaccine  You may need at least one dose of MMR. You may also need a second dose. Meningococcal conjugate (MenACWY) vaccine  One dose is recommended if you are age 30-21 years and a first-year college student living in a residence hall, or if you have one of several medical conditions. You may also need additional booster doses. Pneumococcal conjugate (PCV13) vaccine  You may need this if you have certain conditions and were not previously vaccinated. Pneumococcal polysaccharide (PPSV23) vaccine  You may need one or two doses if you smoke cigarettes or if you have certain conditions. Hepatitis A vaccine  You may need this if you have certain conditions or if you travel or work in places where you may be exposed to hepatitis A. Hepatitis B vaccine  You may need this if you have certain conditions or if you travel or work in places where you may be exposed to hepatitis B. Haemophilus influenzae type b (Hib) vaccine  You may need this if you have certain conditions. You may receive vaccines as individual doses or as more than one vaccine together in one shot (combination vaccines).  Talk with your health care provider about the risks and benefits of combination vaccines. What tests do I need?  Blood tests  Lipid and cholesterol levels. These may be checked every 5 years starting at age 85.  Hepatitis C test.  Hepatitis B test. Screening  Diabetes screening. This is done by checking your blood sugar (glucose) after you have not eaten for a while (fasting).  Sexually transmitted disease (STD) testing.  BRCA-related cancer screening. This may be done if you have a family history of breast, ovarian, tubal, or peritoneal cancers.  Pelvic exam and Pap test. This may be done every 3 years starting at age 10. Starting at age 17, this may be done every 5 years if you have a Pap test in combination with an HPV test. Talk with your health care provider about your test results, treatment options, and if necessary, the need for more tests. Follow these instructions at home: Eating and drinking   Eat a diet that includes fresh fruits and vegetables, whole grains, lean protein, and low-fat dairy.  Take vitamin and mineral supplements as recommended by your health care provider.  Do not drink alcohol if: ? Your health care provider tells you not to drink. ? You are pregnant, may be pregnant, or are  planning to become pregnant.  If you drink alcohol: ? Limit how much you have to 0-1 drink a day. ? Be aware of how much alcohol is in your drink. In the [place] one drink equals one 12 oz bottle of beer (355 mL), one 5 oz glass of wine (148 mL), or one 1 oz glass of hard liquor (44 mL). Lifestyle  Take daily care of your teeth and gums.  Stay active. Exercise for at least 30 minutes on 5 or more days each week.  Do not use any products that contain nicotine or tobacco, such as cigarettes, e-cigarettes, and chewing tobacco. If you need help quitting, ask your health care provider.  If you are sexually active, practice safe sex. Use a condom or other form of birth control  (contraception) in order to prevent pregnancy and STIs (sexually transmitted infections). If you plan to become pregnant, see your health care provider for a preconception visit. What's next?  Visit your health care provider once a year for a well check visit.  Ask your health care provider how often you should have your eyes and teeth checked.  Stay up to date on all vaccines. This information is not intended to replace advice given to you by your health care provider. Make sure you discuss any questions you have with your health care provider. Document Released: 11/05/2001 Document Revised: 05/21/2018 Document Reviewed: 05/21/2018 Elsevier Patient Education  2020 [employer].

## 2019-07-09 NOTE — Progress Notes (Signed)
Name: Blima Jaimes   MRN: [id number]    DOB: May 21, 1986   Date:07/09/2019       Progress Note  Subjective  Chief Complaint  Chief Complaint  Patient presents with  . Annual Exam    HPI  Patient presents for annual CPE and follow up:  Allergies: She is doing better on azelastine and flonase along with zyrtec or xyzal, and singulair nightly along with claritin in the morning.  She does not take her medications every day, just when she feels like she is having a flare up with weather changes.  STill having some cough and sinus congestion, but feels her symptoms have improved significantly overall.  Has been using her albuterol inhaler at work PRN - wearing a mask makes her cough and still becomes overheated at times. Using Symbicort BID.   Migraines: She gets infrequently - last migraine was about 2 weeks ago - takes excedrin and this takes care of things.  Typically starts in the middle of her eye and travels to the back of her head with nausea and vomiting, light and sound sensitivity.  Has never seen neurology in the past.   Menorrhagia: she has had tubes tied after last pregnancy, since her menses resumed, she has had much heavier periods.  She has always had irregular periods. She states was told to avoid OCP due to risk of blood clots.  Did not follow up with Dr. Glennon Mac as her symptoms have improved recently.   Low back pain: Ongoing intermittently for about 6 months.  She wears very supportive shoes to work.  She is taking ibuprofen or tylenol with some relief.  Discussed in detail with the patient - core strengthening, weight loss, stretching/yoga.  Denies weakness/numbness/tingling in any extremities.   Obesity: Diet: cutting back on fried foods; trying to eat more fruits and vegetables. Exercise: Walking more frequently; plays with her children; she is on her feet and walking a lot for her job.  USPSTF grade A and B recommendations    [facility] Visit from 07/09/2019 [facility]  AUDIT-C Score  0    Very occasional ETOH use  Depression: Phq 9 is  negative Depression screen [facility] 2/9 07/09/2019 05/20/2019 05/13/2019 04/23/2019 01/19/2019  Decreased Interest 0 0 0 0 0  Down, Depressed, Hopeless 0 0 0 0 0  PHQ - 2 Score 0 0 0 0 0  Altered sleeping 0 0 0 0 0  Tired, decreased energy 0 0 0 0 0  Change in appetite 0 0 0 0 0  Feeling bad or failure about yourself  0 0 0 0 0  Trouble concentrating 0 0 0 0 0  Moving slowly or fidgety/restless 0 0 0 0 0  Suicidal thoughts 0 0 0 0 0  PHQ-9 Score 0 0 0 0 0  Difficult doing work/chores Not difficult at all Not difficult at all - Not difficult at all Not difficult at all   Hypertension: BP Readings from Last 3 Encounters:  07/09/19 126/72  04/23/19 120/80  12/23/18 118/82   Obesity: Wt Readings from Last 3 Encounters:  07/09/19 210 lb (95.3 kg)  04/23/19 211 lb 14.4 oz (96.1 kg)  08/19/18 184 lb (83.5 kg)   BMI Readings from Last 3 Encounters:  07/09/19 36.05 kg/m  04/23/19 36.37 kg/m  12/23/18 31.58 kg/m     Hep C Screening: Negative in July 2020 STD testing and prevention (HIV/chl/gon/syphilis): Negative in July 2020 for syphilis and HIV. Declines gonorrhea/chlamydia testing today.  No new partners - has 52 female partner.  Intimate partner violence: No concerns Sexual History/Pain during Intercourse: No concerns Menstrual History/LMP/Abnormal Bleeding: See HPI Incontinence Symptoms: No concerns  Breast cancer:  - Last Mammogram: Needs to have this done. - BRCA gene screening: Her mother has breast cancer at 9 and passed away.  Pt needs mammogram done as soon as possible - this was ordered previously.  Pt notes several other family members with breast cancer; she notes that her maternal great aunt had some genetic testing done recently but she is unsure of the results.   Osteoporosis Screening: Walking, weight bearing exercises, and getting plenty of calcium and vitamin D are  discussed in detail.  Cervical cancer screening: UTD  Skin cancer: No concerning lesions Colorectal cancer: Denies family or personal history of colorectal cancer, no changes in BM's - no blood in stool, dark and tarry stool, mucus in stool, or constipation/diarrhea.   Lung cancer:  Never smoker; Low Dose CT Chest recommended if Age 65-80 years, 30 pack-year currently smoking OR have quit w/in 15years. Patient does not qualify.   ECG: Denies chest pain, palpitations; occasional shortness of breath with her allergies and asthma.  Advanced Care Planning: A voluntary discussion about advance care planning including the explanation and discussion of advance directives.  Discussed health care proxy and Living will, and the patient was able to identify a health care proxy as Donato Heinz.  Patient does not have a living will at present time. If patient does have living will, I have requested they bring this to the clinic to be scanned in to their chart.  Lipids: Lab Results  Component Value Date   CHOL 113 04/23/2019   CHOL 131 06/30/2015   Lab Results  Component Value Date   HDL 47 (L) 04/23/2019   HDL 57 06/30/2015   Lab Results  Component Value Date   LDLCALC 52 04/23/2019   LDLCALC 63 06/30/2015   Lab Results  Component Value Date   TRIG 68 04/23/2019   TRIG 55 06/30/2015   Lab Results  Component Value Date   CHOLHDL 2.4 04/23/2019   CHOLHDL 2.3 06/30/2015   No results found for: LDLDIRECT  Glucose: Glucose  Date Value Ref Range Status  06/30/2015 76 65 - 99 mg/dL Final   Glucose, Bld  Date Value Ref Range Status  04/23/2019 108 (H) 65 - 99 mg/dL Final    Comment:    .            Fasting reference interval . For someone without known diabetes, a glucose value between 100 and 125 mg/dL is consistent with prediabetes and should be confirmed with a follow-up test. .     Patient Active Problem List   Diagnosis Date Noted  . Family history of breast cancer   .  Seasonal allergic rhinitis 01/19/2019  . Family history of breast cancer in mother 01/19/2019  . Menorrhagia with irregular cycle 01/19/2019  . Nausea and vomiting during pregnancy 04/24/2018  . BMI 34.0-34.9,adult 03/04/2018  . Migraine headache 01/23/2018  . Occupational exposure to dust 05/23/2015    Past Surgical History:  Procedure Laterality Date  . CESAREAN SECTION  2009  . CESAREAN SECTION N/A 03/02/2017   Procedure: CESAREAN SECTION;  Surgeon: Will Bonnet, MD;  Location: [facility] ORS;  Service: Obstetrics;  Laterality: N/A;  . CESAREAN SECTION N/A 07/10/2018   Procedure: CESAREAN SECTION;  Surgeon: Will Bonnet, MD;  Location: [facility] ORS;  Service: Obstetrics;  Laterality:  N/A;  . TUBAL LIGATION Bilateral 07/10/2018   Procedure: BILATERAL TUBAL LIGATION;  Surgeon: Will Bonnet, MD;  Location: [facility] ORS;  Service: Obstetrics;  Laterality: Bilateral;    Family History  Problem Relation Age of Onset  . Cancer Mother   . Breast cancer Mother 65  . Cancer Maternal Grandfather        2018 - liver  . Breast cancer Other 75       2018  . Breast cancer Other 55       2013  . Breast cancer Other        1990s  . Cancer Paternal Grandmother        unk type  . Breast cancer Other     Social History   Socioeconomic History  . Marital status: Single    Spouse name: Not on file  . Number of children: 3  . Years of education: 38  . Highest education level: Not on file  Occupational History  . Occupation: laminate and crush foam    Comment: [employer]  Social Needs  . Financial resource strain: Not hard at all  . Food insecurity    Worry: Never true    Inability: Never true  . Transportation needs    Medical: No    Non-medical: No  Tobacco Use  . Smoking status: Never Smoker  . Smokeless tobacco: Never Used  Substance and Sexual Activity  . Alcohol use: Yes    Alcohol/week: 0.0 standard drinks    Comment: occasionally  . Drug use: No  . Sexual activity:  Yes    Partners: Male    Birth control/protection: Surgical  Lifestyle  . Physical activity    Days per week: 4 days    Minutes per session: 30 min  . Stress: Not at all  Relationships  . Social connections    Talks on phone: More than three times a week    Gets together: Never    Attends religious service: More than 4 times per year    Active member of club or organization: No    Attends meetings of clubs or organizations: Never    Relationship status: Never married  . Intimate partner violence    Fear of current or ex partner: No    Emotionally abused: No    Physically abused: No    Forced sexual activity: No  Other Topics Concern  . Not on file  Social History Narrative  . Not on file     Current Outpatient Medications:  .  albuterol (VENTOLIN HFA) 108 (90 Base) MCG/ACT inhaler, INHALE 2 PUFFS INTO THE LUNGS EVERY 6 HOURS AS NEEDED FOR WHEEZING OR SHORTNESS OF BREATH, Disp: 18 g, Rfl: 3 .  azelastine (ASTELIN) 0.1 % nasal spray, Place 2 sprays into both nostrils 2 (two) times daily. Use in each nostril as directed, Disp: 30 mL, Rfl: 12 .  budesonide-formoterol (SYMBICORT) 80-4.5 MCG/ACT inhaler, Inhale 2 puffs into the lungs 2 (two) times daily., Disp: 1 Inhaler, Rfl: 3 .  cetirizine (ZYRTEC) 10 MG tablet, Take 1 tablet (10 mg total) by mouth at bedtime., Disp: 90 tablet, Rfl: 2 .  fluticasone (FLONASE) 50 MCG/ACT nasal spray, Place 2 sprays into both nostrils daily., Disp: 16 g, Rfl: 6 .  ibuprofen (ADVIL,MOTRIN) 600 MG tablet, Take 1 tablet (600 mg total) by mouth every 6 (six) hours as needed for headache, mild pain, moderate pain or cramping., Disp: 30 tablet, Rfl: 1 .  loratadine (CLARITIN) 10 MG tablet,  Take 1 tablet (10 mg total) by mouth every morning., Disp: 90 tablet, Rfl: 2 .  montelukast (SINGULAIR) 10 MG tablet, Take 1 tablet (10 mg total) by mouth at bedtime., Disp: 90 tablet, Rfl: 1 .  triamcinolone cream (KENALOG) 0.1 %, Apply 1 application topically 2 (two)  times daily., Disp: 30 g, Rfl: 0 .  benzonatate (TESSALON) 100 MG capsule, Take 1 capsule (100 mg total) by mouth 2 (two) times daily as needed for cough. (Patient not taking: Reported on 07/09/2019), Disp: 30 capsule, Rfl: 1 .  levocetirizine (XYZAL) 5 MG tablet, Take 1 tablet (5 mg total) by mouth every evening. (Patient not taking: Reported on 05/20/2019), Disp: 90 tablet, Rfl: 1 .  Olopatadine HCl 0.2 % SOLN, Apply 1 drop to eye daily. (Patient not taking: Reported on 05/20/2019), Disp: 2.5 mL, Rfl: 3  No Known Allergies   ROS  Constitutional: Negative for fever or weight change.  Respiratory: See HPI Cardiovascular: Negative for chest pain or palpitations.  Gastrointestinal: Negative for abdominal pain, no bowel changes.  Musculoskeletal: Negative for gait problem or joint swelling.  Skin: Negative for rash.  Neurological: Negative for dizziness or headache.  No other specific complaints in a complete review of systems (except as listed in HPI above).  Objective  Vitals:   07/09/19 1004  BP: 126/72  Pulse: 86  Resp: 16  Temp: (!) 97.3 F (36.3 C)  TempSrc: Oral  SpO2: 97%  Weight: 210 lb (95.3 kg)  Height: 5' 4"  (1.626 m)    Body mass index is 36.05 kg/m.  Physical Exam  Constitutional: Patient appears well-developed and well-nourished. No distress.  HENT: Head: Normocephalic and atraumatic. Ears: B TMs ok, no erythema or effusion; Nose: Nose normal. Mouth/Throat: Oropharynx is clear and moist. No oropharyngeal exudate.  Eyes: Conjunctivae and EOM are normal. Pupils are equal, round, and reactive to light. No scleral icterus.  Neck: Normal range of motion. Neck supple. No JVD present. No thyromegaly present.  Cardiovascular: Normal rate, regular rhythm and normal heart sounds.  No murmur heard. No BLE edema. Pulmonary/Chest: Effort normal and breath sounds normal. No respiratory distress. Abdominal: Soft. Bowel sounds are normal, no distension. There is no tenderness.  no masses Breast: no lumps or masses, no nipple discharge or rashes FEMALE GENITALIA: Deferred Musculoskeletal: Normal range of motion, no joint effusions. No gross deformities Neurological: he is alert and oriented to person, place, and time. No cranial nerve deficit. Coordination, balance, strength, speech and gait are normal.  Skin: Skin is warm and dry. No rash noted. No erythema.  Psychiatric: Patient has a normal mood and affect. behavior is normal. Judgment and thought content normal.   Recent Results (from the past 2160 hour(s))  Lipid panel     Status: Abnormal   Collection Time: 04/23/19  7:53 AM  Result Value Ref Range   Cholesterol 113 <200 mg/dL   HDL 47 (L) > OR = 50 mg/dL   Triglycerides 68 <150 mg/dL   LDL Cholesterol (Calc) 52 mg/dL (calc)    Comment: Reference range: <100 . Desirable range <100 mg/dL for primary prevention;   <70 mg/dL for patients with CHD or diabetic patients  with > or = 2 CHD risk factors. Marland Kitchen LDL-C is now calculated using the Martin-Hopkins  calculation, which is a validated novel method providing  better accuracy than the Friedewald equation in the  estimation of LDL-C.  Cresenciano Genre et al. Annamaria Helling. 3662;947(65): 2061-2068  (http://education.[facility].com/faq/FAQ164)    Total CHOL/HDL Ratio 2.4 <5.0 (  calc)   Non-HDL Cholesterol (Calc) 66 <130 mg/dL (calc)    Comment: For patients with diabetes plus 1 major ASCVD risk  factor, treating to a non-HDL-C goal of <100 mg/dL  (LDL-C of <70 mg/dL) is considered a therapeutic  option.   COMPLETE METABOLIC PANEL WITH GFR     Status: Abnormal   Collection Time: 04/23/19  7:53 AM  Result Value Ref Range   Glucose, Bld 108 (H) 65 - 99 mg/dL    Comment: .            Fasting reference interval . For someone without known diabetes, a glucose value between 100 and 125 mg/dL is consistent with prediabetes and should be confirmed with a follow-up test. .    BUN 11 7 - 25 mg/dL   Creat 0.67 0.50  - 1.10 mg/dL   GFR, Est Non African American 117 > OR = 60 mL/min/1.38m   GFR, Est African American 136 > OR = 60 mL/min/1.730m  BUN/Creatinine Ratio NOT APPLICABLE 6 - 22 (calc)   Sodium 138 135 - 146 mmol/L   Potassium 4.2 3.5 - 5.3 mmol/L   Chloride 107 98 - 110 mmol/L   CO2 24 20 - 32 mmol/L   Calcium 8.7 8.6 - 10.2 mg/dL   Total Protein 6.6 6.1 - 8.1 g/dL   Albumin 4.0 3.6 - 5.1 g/dL   Globulin 2.6 1.9 - 3.7 g/dL (calc)   AG Ratio 1.5 1.0 - 2.5 (calc)   Total Bilirubin 0.2 0.2 - 1.2 mg/dL   Alkaline phosphatase (APISO) 54 31 - 125 U/L   AST 14 10 - 30 U/L   ALT 17 6 - 29 U/L  CBC with Differential/Platelet     Status: Abnormal   Collection Time: 04/23/19  7:53 AM  Result Value Ref Range   WBC 5.6 3.8 - 10.8 Thousand/uL   RBC 4.56 3.80 - 5.10 Million/uL   Hemoglobin 11.7 11.7 - 15.5 g/dL   HCT 36.2 35.0 - 45.0 %   MCV 79.4 (L) 80.0 - 100.0 fL   MCH 25.7 (L) 27.0 - 33.0 pg   MCHC 32.3 32.0 - 36.0 g/dL   RDW 13.8 11.0 - 15.0 %   Platelets 366 140 - 400 Thousand/uL   MPV 9.4 7.5 - 12.5 fL   Neutro Abs 2,867 1,500 - 7,800 cells/uL   Lymphs Abs 2,100 850 - 3,900 cells/uL   Absolute Monocytes 482 200 - 950 cells/uL   Eosinophils Absolute 129 15 - 500 cells/uL   Basophils Absolute 22 0 - 200 cells/uL   Neutrophils Relative % 51.2 %   Total Lymphocyte 37.5 %   Monocytes Relative 8.6 %   Eosinophils Relative 2.3 %   Basophils Relative 0.4 %  TSH     Status: None   Collection Time: 04/23/19  7:53 AM  Result Value Ref Range   TSH 1.47 mIU/L    Comment:           Reference Range .           > or = 20 Years  0.40-4.50 .                Pregnancy Ranges           First trimester    0.26-2.66           Second trimester   0.55-2.73           Third trimester    0.43-2.91   RPR  Status: None   Collection Time: 04/23/19  7:53 AM  Result Value Ref Range   RPR Ser Ql NON-REACTIVE NON-REACTI  Hepatitis C antibody     Status: None   Collection Time: 04/23/19  7:53 AM   Result Value Ref Range   Hepatitis C Ab NON-REACTIVE NON-REACTI   SIGNAL TO CUT-OFF 0.02 <1.00    Comment: . HCV antibody was non-reactive. There is no laboratory  evidence of HCV infection. . In most cases, no further action is required. However, if recent HCV exposure is suspected, a test for HCV RNA (test code [phone]) is suggested. . For additional information please refer to http://education.[facility].com/faq/FAQ22v1 (This link is being provided for informational/ educational purposes only.) .   HIV Antibody (routine testing w rflx)     Status: None   Collection Time: 04/23/19  7:53 AM  Result Value Ref Range   HIV 1&2 Ab, 4th Generation NON-REACTIVE NON-REACTI    Comment: HIV-1 antigen and HIV-1/HIV-2 antibodies were not detected. There is no laboratory evidence of HIV infection. Marland Kitchen PLEASE NOTE: This information has been disclosed to you from records whose confidentiality may be protected by state law.  If your state requires such protection, then the state law prohibits you from making any further disclosure of the information without the specific written consent of the person to whom it pertains, or as otherwise permitted by law. A general authorization for the release of medical or other information is NOT sufficient for this purpose. . For additional information please refer to http://education.[facility].com/faq/FAQ106 (This link is being provided for informational/ educational purposes only.) . Marland Kitchen The performance of this assay has not been clinically validated in patients less than 51 years old. .   Novel Coronavirus, NAA ([facility])     Status: None   Collection Time: 04/28/19 12:00 AM   Specimen: Oropharyngeal(OP) collection in vial transport medium   OROPHARYNGEA  TESTING  Result Value Ref Range   SARS-CoV-2, NAA Not Detected Not Detected    Comment: This test was developed and its performance characteristics determined by [employer]. This test has not been FDA cleared or approved. This test has been authorized by FDA under an Emergency Use Authorization (EUA). This test is only authorized for the duration of time the declaration that circumstances exist justifying the authorization of the emergency use of in vitro diagnostic tests for detection of SARS-CoV-2 virus and/or diagnosis of COVID-19 infection under section 564(b)(1) of the Act, 21 U.S.C. 127NTZ-0(Y)(1), unless the authorization is terminated or revoked sooner. When diagnostic testing is negative, the possibility of a false negative result should be considered in the context of a patient's recent exposures and the presence of clinical signs and symptoms consistent with COVID-19. An individual without symptoms of COVID-19 and who is not shedding SARS-CoV-2 virus would expect to have a negative (not detected) result in this assay.   Novel Coronavirus, NAA ([facility])     Status: None   Collection Time: 05/20/19 12:00 AM   Specimen: Oropharyngeal(OP) collection in vial transport medium   OROPHARYNGEA  TESTING  Result Value Ref Range   SARS-CoV-2, NAA Not Detected Not Detected    Comment: This test was developed and its performance characteristics determined by [employer]. This test has not been FDA cleared or approved. This test has been authorized by FDA under an Emergency Use Authorization (EUA). This test is only authorized for the duration of time the declaration that circumstances exist justifying the authorization of the emergency use of in vitro  diagnostic tests for detection of SARS-CoV-2 virus and/or diagnosis of COVID-19 infection under section 564(b)(1) of the Act, 21 U.S.C. 262MBT-5(H)(7), unless the authorization is terminated or revoked sooner. When diagnostic testing is negative, the possibility of a false negative result should be considered in the context of a patient's recent exposures and the presence of clinical  signs and symptoms consistent with COVID-19. An individual without symptoms of COVID-19 and who is not shedding SARS-CoV-2 virus would expect to have a negative (not detected) result in this assay.     Fall Risk: Fall Risk  07/09/2019 05/20/2019 05/13/2019 04/23/2019 01/19/2019  Falls in the past year? 0 0 0 0 0  Number falls in past yr: 0 0 0 0 0  Injury with Fall? 0 0 0 0 0  Follow up Falls evaluation completed - - Falls evaluation completed Falls evaluation completed    Assessment & Plan  1. Well woman exam (no gynecological exam) -USPSTF grade A and B recommendations reviewed with patient; age-appropriate recommendations, preventive care, screening tests, etc discussed and encouraged; healthy living encouraged; see AVS for patient education given to patient -Discussed importance of 150 minutes of physical activity weekly, eat two servings of fish weekly, eat one serving of tree nuts ( cashews, pistachios, pecans, almonds.Marland Kitchen) every other day, eat 6 servings of fruit/vegetables daily and drink plenty of water and avoid sweet beverages.  2. Seasonal allergic rhinitis, unspecified trigger - Maintain medications  3. Chronic migraine without aura without status migrainosus, not intractable - Stable with OTC medications  4. Menorrhagia with irregular cycle - Improving; will call back if needing referral to GYN  5. Chronic midline low back pain without sciatica - tiZANidine (ZANAFLEX) 4 MG tablet; Take 0.5-1 tablets (2-4 mg total) by mouth every 8 (eight) hours as needed for muscle spasms.  Dispense: 30 tablet; Refill: 0  6. Needs flu shot - Flu Vaccine QUAD 6+ mos PF IM (Fluarix Quad PF)

## 2019-07-16 ENCOUNTER — Other Ambulatory Visit: Payer: Self-pay | Admitting: Family Medicine

## 2019-07-16 DIAGNOSIS — G8929 Other chronic pain: Secondary | ICD-10-CM

## 2019-07-16 NOTE — Telephone Encounter (Signed)
Requested medication (s) are due for refill today: yes  Requested medication (s) are on the active medication list: yes  Last refill:  07/09/2019  Future visit scheduled: yes  Notes to clinic:  Refill cannot be delegated   Requested Prescriptions  Pending Prescriptions Disp Refills   tiZANidine (ZANAFLEX) 4 MG tablet [Pharmacy Med Name: TIZANIDINE 4MG  TABLETS] 30 tablet 0    Sig: TAKE 1/2 TO 1 TABLET(2 TO 4 MG) BY MOUTH EVERY 8 HOURS AS NEEDED FOR MUSCLE SPASMS     Not Delegated - Cardiovascular:  Alpha-2 Agonists - tizanidine Failed - 07/16/2019  3:15 AM      Failed - This refill cannot be delegated      Passed - Valid encounter within last 6 months    Recent Outpatient Visits          1 week ago Well woman exam (no gynecological exam)   [place], FNP   1 month ago Cough   [place], FNP   2 months ago Seasonal allergic rhinitis, unspecified trigger   [place], FNP   2 months ago Heat intolerance   [place], FNP   5 months ago Seasonal allergic rhinitis, unspecified trigger   [place], Astrid Divine, [place]      Future Appointments            In 7 months Hubbard Hartshorn, [facility], [facility]

## 2019-07-21 ENCOUNTER — Other Ambulatory Visit: Payer: Self-pay | Admitting: Family Medicine

## 2019-07-21 DIAGNOSIS — J302 Other seasonal allergic rhinitis: Secondary | ICD-10-CM

## 2019-07-21 NOTE — Telephone Encounter (Signed)
Requested medication (s) are due for refill today: no  Requested medication (s) are on the active medication list: no  Last refill:  04/24/2019  Future visit scheduled: yes  Notes to clinic:  Medication ws discontinued Review for refill   Requested Prescriptions  Pending Prescriptions Disp Refills   levocetirizine (XYZAL) 5 MG tablet [Pharmacy Med Name: LEVOCETIRIZINE 5MG  TABLETS] 90 tablet 1    Sig: TAKE 1 TABLET(5 MG) BY MOUTH EVERY EVENING     Ear, Nose, and Throat:  Antihistamines Passed - 07/21/2019  7:08 AM      Passed - Valid encounter within last 12 months    Recent Outpatient Visits          1 week ago Well woman exam (no gynecological exam)   [place], FNP   2 months ago Cough   [place], FNP   2 months ago Seasonal allergic rhinitis, unspecified trigger   [place], FNP   2 months ago Heat intolerance   [place], FNP   6 months ago Seasonal allergic rhinitis, unspecified trigger   [place], Astrid Divine, [place]      Future Appointments            In 6 months Hubbard Hartshorn, [facility], [facility]

## 2019-07-21 NOTE — Telephone Encounter (Signed)
Would like xyzal called in . Other medication is not working for her

## 2019-09-06 ENCOUNTER — Encounter: Payer: Self-pay | Admitting: Licensed Clinical Social Worker

## 2019-11-15 ENCOUNTER — Ambulatory Visit: Payer: 59 | Attending: Internal Medicine

## 2019-11-15 DIAGNOSIS — Z20822 Contact with and (suspected) exposure to covid-19: Secondary | ICD-10-CM

## 2019-11-16 LAB — NOVEL CORONAVIRUS, NAA: SARS-CoV-2, NAA: NOT DETECTED

## 2019-11-19 ENCOUNTER — Encounter: Payer: Self-pay | Admitting: Family Medicine

## 2019-11-19 ENCOUNTER — Ambulatory Visit (INDEPENDENT_AMBULATORY_CARE_PROVIDER_SITE_OTHER): Payer: 59 | Admitting: Family Medicine

## 2019-11-19 ENCOUNTER — Other Ambulatory Visit: Payer: Self-pay

## 2019-11-19 VITALS — Ht 64.0 in | Wt 210.0 lb

## 2019-11-19 DIAGNOSIS — J069 Acute upper respiratory infection, unspecified: Secondary | ICD-10-CM | POA: Diagnosis not present

## 2019-11-19 DIAGNOSIS — J209 Acute bronchitis, unspecified: Secondary | ICD-10-CM

## 2019-11-19 DIAGNOSIS — J302 Other seasonal allergic rhinitis: Secondary | ICD-10-CM

## 2019-11-19 DIAGNOSIS — J4521 Mild intermittent asthma with (acute) exacerbation: Secondary | ICD-10-CM

## 2019-11-19 DIAGNOSIS — Z5329 Procedure and treatment not carried out because of patient's decision for other reasons: Secondary | ICD-10-CM

## 2019-11-19 MED ORDER — MUCINEX 600 MG PO TB12: 600.0000 mg | ORAL_TABLET | Freq: Two times a day (BID) | ORAL | 0 refills | Status: AC

## 2019-11-19 MED ORDER — BENZONATATE 100 MG PO CAPS: 100.0000 mg | ORAL_CAPSULE | Freq: Three times a day (TID) | ORAL | 0 refills | Status: DC | PRN

## 2019-11-19 MED ORDER — PREDNISONE 20 MG PO TABS: 40.0000 mg | ORAL_TABLET | Freq: Every day | ORAL | 0 refills | Status: AC

## 2019-11-19 MED ORDER — AZITHROMYCIN 250 MG PO TABS: 250.0000 mg | ORAL_TABLET | Freq: Every day | ORAL | 0 refills | Status: DC

## 2019-11-19 NOTE — Progress Notes (Signed)
Name: Loretta Jensen   MRN: [id number]    DOB: May 14, 1986   Date:11/19/2019       Progress Note  Subjective:    Chief Complaint  Chief Complaint  Patient presents with  . Cough    negative for covid, onset 2/20    I connected with  Kianah Johanning  on 11/19/19 at  8:00 AM EST by a video enabled telemedicine application and verified that I am speaking with the correct person using two identifiers.  I discussed the limitations of evaluation and management by telemedicine and the availability of in person appointments. The patient expressed understanding and agreed to proceed. Staff also discussed with the patient that there may be a patient responsible charge related to this service. Patient Location:  Provider Location:  Additional Individuals present:   HPI called her three times - sent link, tried to call through app, then tried to call with office phone - went to voice mail  Patient Active Problem List   Diagnosis Date Noted  . Family history of breast cancer   . Seasonal allergic rhinitis 01/19/2019  . Family history of breast cancer in mother 01/19/2019  . Menorrhagia with irregular cycle 01/19/2019  . Nausea and vomiting during pregnancy 04/24/2018  . BMI 34.0-34.9,adult 03/04/2018  . Migraine headache 01/23/2018  . Occupational exposure to dust 05/23/2015    Social History   Tobacco Use  . Smoking status: Never Smoker  . Smokeless tobacco: Never Used  Substance Use Topics  . Alcohol use: Yes    Alcohol/week: 0.0 standard drinks    Comment: occasionally     Current Outpatient Medications:  .  albuterol (VENTOLIN HFA) 108 (90 Base) MCG/ACT inhaler, INHALE 2 PUFFS INTO THE LUNGS EVERY 6 HOURS AS NEEDED FOR WHEEZING OR SHORTNESS OF BREATH, Disp: 18 g, Rfl: 3 .  azelastine (ASTELIN) 0.1 % nasal spray, Place 2 sprays into both nostrils 2 (two) times daily. Use in each nostril as directed, Disp: 30 mL, Rfl: 12 .  budesonide-formoterol (SYMBICORT) 80-4.5  MCG/ACT inhaler, Inhale 2 puffs into the lungs 2 (two) times daily., Disp: 1 Inhaler, Rfl: 3 .  fluticasone (FLONASE) 50 MCG/ACT nasal spray, Place 2 sprays into both nostrils daily., Disp: 16 g, Rfl: 6 .  ibuprofen (ADVIL,MOTRIN) 600 MG tablet, Take 1 tablet (600 mg total) by mouth every 6 (six) hours as needed for headache, mild pain, moderate pain or cramping., Disp: 30 tablet, Rfl: 1 .  levocetirizine (XYZAL) 5 MG tablet, TAKE 1 TABLET(5 MG) BY MOUTH EVERY EVENING, Disp: 90 tablet, Rfl: 1 .  loratadine (CLARITIN) 10 MG tablet, Take 1 tablet (10 mg total) by mouth every morning., Disp: 90 tablet, Rfl: 2 .  montelukast (SINGULAIR) 10 MG tablet, Take 1 tablet (10 mg total) by mouth at bedtime., Disp: 90 tablet, Rfl: 1 .  Olopatadine HCl 0.2 % SOLN, Apply 1 drop to eye daily., Disp: 2.5 mL, Rfl: 3 .  tiZANidine (ZANAFLEX) 4 MG tablet, Take 0.5-1 tablets (2-4 mg total) by mouth every 8 (eight) hours as needed for muscle spasms., Disp: 30 tablet, Rfl: 0  No Known Allergies    Review of Systems    Objective:   Virtual encounter, vitals limited, only able to obtain the following Today's Vitals   11/19/19 0746  Weight: 210 lb (95.3 kg)  Height: 5\' 4"  (1.626 m)   Body mass index is 36.05 kg/m. Nursing Note and Vital Signs reviewed.  Physical Exam  PE limited by telephone encounter  No results  found for this or any previous visit (from the past 28 hour(s)).  Assessment and Plan:     ICD-10-CM   1. No-show for appointment  Z53.29    After reviewing chart I first send link to pt for virtual visit around 8:05, after more than 5 min of waiting I attempted to call her through the app w/o answer, then called via office phone and it went right to voice mail, staff attempted two more calls that went to voicemail - LVM for pt  Note closed - Will try and help her today if able to connect, but it schedule does not allow pt was encouraged to go to UC for her CC.      -Red flags and when to  present for emergency care or RTC including fever >101.86F, chest pain, shortness of breath, new/worsening/un-resolving symptoms, reviewed with patient at time of visit. Follow up and care instructions discussed and provided in AVS. - I discussed the assessment and treatment plan with the patient. The patient was provided an opportunity to ask questions and all were answered. The patient agreed with the plan and demonstrated an understanding of the instructions.  I provided  minutes of non-face-to-face time during this encounter.  Delsa Grana, PA-C 11/19/19 8:12 AM

## 2019-11-19 NOTE — Progress Notes (Signed)
Name: Loretta Jensen   MRN: [id number]    DOB: Apr 01, 1986   Date:11/19/2019       Progress Note  Subjective:    Chief Complaint  Chief Complaint  Patient presents with  . Cough    I connected with  Daren Vaile on 11/19/19 at 10:00 AM EST by telephone and verified that I am speaking with the correct person using two identifiers.   I discussed the limitations, risks, security and privacy concerns of performing an evaluation and management service by telephone and the availability of in person appointments. Staff also discussed with the patient that there may be a patient responsible charge related to this service. Patient Location: home Provider Location: [facility] Additional Individuals present: none   HPI  Pt complains of illness/URI sx/cough onset 5 d ago.  Sx consist of nasal drainage, yellow and sometimes bloody, feels like its hard to swallow like a lump is in her throat, though she denies sore throat, and for the past week she has been coughing severe and night and intermittent throughout the day - waking up every one in the house.  Asked if she has hx of asthma or bronchitis and she states no- Mon through Wed she had fever, sweats, chills, tmax 101, no fever yesterday or today.  She does have current associated HA, dizziness, SOB when wearing a mask No current CP, wheeze, body aches, sweats, N, V, D. She has tried treating OTC nyquil, vicks. Later she states she is on two inhalers daily for similar sx last year when she couldn't break, but she doesn't know her dx - she was due to f/up with PCP about this in a few months.  She stopped taking allergy meds, but continued symbicort and SABA Of note - xyzal, claritin and singulair are on chart - she says she takes only as needed.  Neg test for COVID tested on Monday - did a nasal swab - was done with [place] [facility] was negative  She states she needs a work not for Monday but she may be called into work sooner than  that.   Patient Active Problem List   Diagnosis Date Noted  . Family history of breast cancer   . Seasonal allergic rhinitis 01/19/2019  . Family history of breast cancer in mother 01/19/2019  . Menorrhagia with irregular cycle 01/19/2019  . Nausea and vomiting during pregnancy 04/24/2018  . BMI 34.0-34.9,adult 03/04/2018  . Migraine headache 01/23/2018  . Occupational exposure to dust 05/23/2015    Social History   Tobacco Use  . Smoking status: Never Smoker  . Smokeless tobacco: Never Used  Substance Use Topics  . Alcohol use: Yes    Alcohol/week: 0.0 standard drinks    Comment: occasionally     Current Outpatient Medications:  .  albuterol (VENTOLIN HFA) 108 (90 Base) MCG/ACT inhaler, INHALE 2 PUFFS INTO THE LUNGS EVERY 6 HOURS AS NEEDED FOR WHEEZING OR SHORTNESS OF BREATH, Disp: 18 g, Rfl: 3 .  azelastine (ASTELIN) 0.1 % nasal spray, Place 2 sprays into both nostrils 2 (two) times daily. Use in each nostril as directed, Disp: 30 mL, Rfl: 12 .  azithromycin (ZITHROMAX Z-PAK) 250 MG tablet, Take 1 tablet (250 mg total) by mouth daily. 500mg  PO day 1, then 250mg  PO days 2-5, Disp: 6 tablet, Rfl: 0 .  benzonatate (TESSALON) 100 MG capsule, Take 1 capsule (100 mg total) by mouth 3 (three) times daily as needed for cough., Disp: 30 capsule, Rfl: 0 .  budesonide-formoterol (SYMBICORT) 80-4.5 MCG/ACT inhaler, Inhale 2 puffs into the lungs 2 (two) times daily., Disp: 1 Inhaler, Rfl: 3 .  fluticasone (FLONASE) 50 MCG/ACT nasal spray, Place 2 sprays into both nostrils daily., Disp: 16 g, Rfl: 6 .  guaiFENesin (MUCINEX) 600 MG 12 hr tablet, Take 1 tablet (600 mg total) by mouth 2 (two) times daily for 7 days., Disp: 14 tablet, Rfl: 0 .  ibuprofen (ADVIL,MOTRIN) 600 MG tablet, Take 1 tablet (600 mg total) by mouth every 6 (six) hours as needed for headache, mild pain, moderate pain or cramping., Disp: 30 tablet, Rfl: 1 .  levocetirizine (XYZAL) 5 MG tablet, TAKE 1 TABLET(5 MG) BY MOUTH  EVERY EVENING, Disp: 90 tablet, Rfl: 1 .  loratadine (CLARITIN) 10 MG tablet, Take 1 tablet (10 mg total) by mouth every morning., Disp: 90 tablet, Rfl: 2 .  montelukast (SINGULAIR) 10 MG tablet, Take 1 tablet (10 mg total) by mouth at bedtime., Disp: 90 tablet, Rfl: 1 .  Olopatadine HCl 0.2 % SOLN, Apply 1 drop to eye daily., Disp: 2.5 mL, Rfl: 3 .  predniSONE (DELTASONE) 20 MG tablet, Take 2 tablets (40 mg total) by mouth daily with breakfast for 5 days., Disp: 10 tablet, Rfl: 0 .  tiZANidine (ZANAFLEX) 4 MG tablet, Take 0.5-1 tablets (2-4 mg total) by mouth every 8 (eight) hours as needed for muscle spasms., Disp: 30 tablet, Rfl: 0  No Known Allergies  Chart Review: I personally reviewed active problem list, medication list, allergies, family history, social history, health maintenance, notes from last encounter, lab results, imaging with the patient/caregiver today.   Review of Systems 10 Systems reviewed and are negative for acute change except as noted in the HPI.   Objective:    Virtual encounter, vitals limited, only able to obtain the following Today's Vitals   11/19/19 1113  Weight: 210 lb (95.3 kg)  Height: 5\' 4"  (1.626 m)   Body mass index is 36.05 kg/m. Nursing Note and Vital Signs reviewed.  Physical Exam Alert, phonation clear, no audible stridor, wheeze or tachypnea during telephone visit. PE limited by telephone encounter  No results found for this or any previous visit (from the past 72 hour(s)).  Assessment and Plan:     ICD-10-CM   1. Acute bronchitis, unspecified organism  J20.9 predniSONE (DELTASONE) 20 MG tablet    azithromycin (ZITHROMAX Z-PAK) 250 MG tablet    benzonatate (TESSALON) 100 MG capsule    guaiFENesin (MUCINEX) 600 MG 12 hr tablet  2. Seasonal allergic rhinitis, unspecified trigger  J30.2   3. Mild intermittent reactive airway disease with acute exacerbation  J45.21 predniSONE (DELTASONE) 20 MG tablet    azithromycin (ZITHROMAX Z-PAK)  250 MG tablet    benzonatate (TESSALON) 100 MG capsule    guaiFENesin (MUCINEX) 600 MG 12 hr tablet  4. Upper respiratory tract infection, unspecified type  J06.9    Patient with URI symptoms and some history of some lung disease although the diagnosis is a little unclear patient is new to me, it appears she has had symptoms like this before and is on Symbicort and albuterol and is intermittently taking her other medications Singulair and antihistamines.  She did have viral type symptoms and fever earlier this week was Covid tested negative, fevers have resolved but she continues to have nasal drainage, postnasal drip, some discomfort in her throat and persistent coughing that is much worse at night and intermittent throughout the day she states cough is nonproductive.  Does seem slightly suspicious  for an asthma exacerbation or acute bronchitis given her past medications we will treat her with Z-Pak, steroid burst, encouraged her to get back on her antihistamines daily and to use over-the-counter cough medications and Mucinex.  Cannot write her back to work at this time with viral symptoms and respiratory symptoms will need to touch base with her early next week I did write her a note to excuse her from work from the onset of illness until next Monday, patient will try and contact us through MyChart with whether not she has fever and improving symptoms.  Currently no concern for acute bacterial sinusitis she has no facial sinus tenderness to palpation, discussed sudden worsening or need for treatment of bacterial sinusitis explained signs and symptoms encouraged her to contact us if anything develops like that we would need to give a different antibiotic been a Z-Pak.  Work note was given to her through my chart excusing her from work due to [employer] pandemic -would feel comfortable clearing her back to work early next week with resolving symptoms, if she is afebrile, and more than 7 days from the onset of  her symptoms and from negative test date.   -Red flags and when to present for emergency care or RTC including but not limited to new/worsening/un-resolving symptoms,  reviewed with patient at time of visit. Follow up and care instructions discussed and provided in AVS. - I discussed the assessment and treatment plan with the patient. The patient was provided an opportunity to ask questions and all were answered. The patient agreed with the plan and demonstrated an understanding of the instructions.  - The patient was advised to call back or seek an in-person evaluation if the symptoms worsen or if the condition fails to improve as anticipated.  I provided 30+ minutes of non-face-to-face time during this encounter. More than 18 min spent on the phone with her today and remainder for chart/med review, drafting and sending letter to pt for work, prescribing meds, coordinating plan with CMA and pt, chart documentation etc.   Danelle Berry, PA-C 11/19/19 12:06 PM

## 2019-11-22 ENCOUNTER — Encounter: Payer: Self-pay | Admitting: Family Medicine

## 2019-11-23 ENCOUNTER — Telehealth: Payer: 59 | Admitting: Family Medicine

## 2019-12-22 ENCOUNTER — Other Ambulatory Visit: Payer: Self-pay | Admitting: Family Medicine

## 2019-12-22 DIAGNOSIS — M545 Low back pain, unspecified: Secondary | ICD-10-CM

## 2019-12-22 DIAGNOSIS — G8929 Other chronic pain: Secondary | ICD-10-CM

## 2019-12-22 MED ORDER — TIZANIDINE HCL 4 MG PO TABS: 2.0000 mg | ORAL_TABLET | Freq: Three times a day (TID) | ORAL | 0 refills | Status: DC | PRN

## 2019-12-29 ENCOUNTER — Other Ambulatory Visit: Payer: Self-pay | Admitting: Family Medicine

## 2019-12-29 DIAGNOSIS — M545 Low back pain, unspecified: Secondary | ICD-10-CM

## 2019-12-29 DIAGNOSIS — G8929 Other chronic pain: Secondary | ICD-10-CM

## 2019-12-29 NOTE — Telephone Encounter (Signed)
Requested medications are due for refill today?   Yes -  This medication refill request cannot be delegated.   Requested medications are on active medication list?  Yes  Last Refill:   12/22/2019  # 30 with no refills  Future visit scheduled?  Yes in one month  Notes to Clinic:  This medication refill cannot be delegated.

## 2020-02-15 ENCOUNTER — Ambulatory Visit: Payer: 59 | Admitting: Family Medicine

## 2020-02-23 ENCOUNTER — Ambulatory Visit: Payer: 59 | Admitting: Family Medicine

## 2020-03-17 ENCOUNTER — Encounter: Payer: Self-pay | Admitting: Family Medicine

## 2020-03-17 ENCOUNTER — Ambulatory Visit (INDEPENDENT_AMBULATORY_CARE_PROVIDER_SITE_OTHER): Payer: 59 | Admitting: Family Medicine

## 2020-03-17 ENCOUNTER — Other Ambulatory Visit: Payer: Self-pay

## 2020-03-17 VITALS — BP 134/88 | HR 88 | Temp 98.3°F | Resp 16 | Ht 64.0 in | Wt 205.9 lb

## 2020-03-17 DIAGNOSIS — J302 Other seasonal allergic rhinitis: Secondary | ICD-10-CM

## 2020-03-17 DIAGNOSIS — M545 Low back pain: Secondary | ICD-10-CM

## 2020-03-17 DIAGNOSIS — G8929 Other chronic pain: Secondary | ICD-10-CM | POA: Diagnosis not present

## 2020-03-17 DIAGNOSIS — J45909 Unspecified asthma, uncomplicated: Secondary | ICD-10-CM | POA: Insufficient documentation

## 2020-03-17 DIAGNOSIS — J4521 Mild intermittent asthma with (acute) exacerbation: Secondary | ICD-10-CM | POA: Diagnosis not present

## 2020-03-17 MED ORDER — ALBUTEROL SULFATE HFA 108 (90 BASE) MCG/ACT IN AERS: INHALATION_SPRAY | RESPIRATORY_TRACT | 3 refills | Status: DC

## 2020-03-17 MED ORDER — LEVOCETIRIZINE DIHYDROCHLORIDE 5 MG PO TABS: ORAL_TABLET | ORAL | 2 refills | Status: DC

## 2020-03-17 MED ORDER — IBUPROFEN 600 MG PO TABS: 600.0000 mg | ORAL_TABLET | Freq: Three times a day (TID) | ORAL | 1 refills | Status: DC | PRN

## 2020-03-17 MED ORDER — OLOPATADINE HCL 0.2 % OP SOLN: 1.0000 [drp] | Freq: Every day | OPHTHALMIC | 3 refills | Status: DC

## 2020-03-17 MED ORDER — MONTELUKAST SODIUM 10 MG PO TABS: 10.0000 mg | ORAL_TABLET | Freq: Every day | ORAL | 3 refills | Status: DC

## 2020-03-17 MED ORDER — BENZONATATE 100 MG PO CAPS: 100.0000 mg | ORAL_CAPSULE | Freq: Three times a day (TID) | ORAL | 0 refills | Status: DC | PRN

## 2020-03-17 MED ORDER — LORATADINE 10 MG PO TABS: 10.0000 mg | ORAL_TABLET | ORAL | 2 refills | Status: DC
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
## 2020-03-30 ENCOUNTER — Encounter: Payer: Self-pay | Admitting: Physical Therapy

## 2020-03-30 ENCOUNTER — Other Ambulatory Visit: Payer: Self-pay

## 2020-03-30 ENCOUNTER — Ambulatory Visit: Payer: 59 | Attending: Family Medicine | Admitting: Physical Therapy

## 2020-03-30 DIAGNOSIS — M6281 Muscle weakness (generalized): Secondary | ICD-10-CM | POA: Insufficient documentation

## 2020-03-30 DIAGNOSIS — R293 Abnormal posture: Secondary | ICD-10-CM | POA: Insufficient documentation

## 2020-03-30 DIAGNOSIS — M545 Low back pain, unspecified: Secondary | ICD-10-CM
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
## 2020-04-03 ENCOUNTER — Ambulatory Visit: Payer: 59 | Admitting: Physical Therapy

## 2020-04-13 ENCOUNTER — Encounter: Payer: 59 | Admitting: Physical Therapy

## 2020-04-20 ENCOUNTER — Encounter: Payer: 59 | Admitting: Physical Therapy

## 2020-04-27 ENCOUNTER — Encounter: Payer: 59 | Admitting: Physical Therapy

## 2020-05-03 ENCOUNTER — Encounter: Payer: 59 | Admitting: Physical Therapy

## 2020-05-22 ENCOUNTER — Ambulatory Visit: Payer: Self-pay

## 2020-05-22 NOTE — Telephone Encounter (Addendum)
Pt received 2nd shot of Moderna 05/19/2020 (Thursday) at 1900. Sat evening, pt developed  Chills, fever to 101.4 and headache. Took Tylenol but still having chills and sweating at night. Pt took her temp during call and it 99.4. Pt denies rash or facial swelling.  Care advice given for home care and pt verbalized understanding. Pt requesting a note form provider that states that the pt discussed issue with Nurse Triage with [facility] for documentation for her manager. Routing to PCP. Pt is active in MyChart.  Reason for Disposition . COVID-19 vaccine, systemic reactions (e.g., fatigue, fever, muscle aches), questions about  Answer Assessment - Initial Assessment Questions 1. MAIN CONCERN OR SYMPTOM:  "What is your main concern right now?" "What question do you have?" "What's the main symptom you're worried about?" (e.g., fever, pain, redness, swelling)     Chills fever and headache 2. VACCINE: "What vaccination did you receive?" "Is this your first or second shot?" (e.g., none; AstraZeneca, J&J, Moderna, Pfizer, other)     Moderna 3. SYMPTOM ONSET: "When did the chills and headache and fever begin?" (e.g., not relevant; hours, days)      Saturday 4. SYMPTOM SEVERITY: "How bad is it?"     Headache 9/10,  5. FEVER: "Is there a fever?" If so, ask: "What is it, how was it measured, and when did it start?"      Yes had fever Sat to 101.4- pt rechecked temp during triage: 99.4 6. PAST REACTIONS: "Have you reacted to immunizations before?" If so, ask: "What happened?"    no 7. OTHER SYMPTOMS: "Do you have any other symptoms?"     no  Protocols used: CORONAVIRUS (COVID-19) VACCINE QUESTIONS AND REACTIONS-A-AH

## 2020-05-22 NOTE — Telephone Encounter (Signed)
Left detailed vm to schedule an appt

## 2020-06-14 ENCOUNTER — Ambulatory Visit: Payer: 59 | Admitting: Family Medicine

## 2020-06-23 ENCOUNTER — Ambulatory Visit: Payer: 59 | Admitting: Family Medicine

## 2020-07-27 ENCOUNTER — Other Ambulatory Visit: Payer: Self-pay | Admitting: Family Medicine

## 2020-07-27 DIAGNOSIS — M545 Low back pain, unspecified: Secondary | ICD-10-CM

## 2020-09-27 ENCOUNTER — Encounter: Payer: Self-pay | Admitting: Family Medicine

## 2020-09-28 ENCOUNTER — Other Ambulatory Visit: Payer: Self-pay | Admitting: Emergency Medicine

## 2020-09-28 DIAGNOSIS — J4521 Mild intermittent asthma with (acute) exacerbation: Secondary | ICD-10-CM

## 2020-10-03 ENCOUNTER — Encounter: Payer: Self-pay | Admitting: Family Medicine

## 2020-10-05 ENCOUNTER — Telehealth (INDEPENDENT_AMBULATORY_CARE_PROVIDER_SITE_OTHER): Payer: 59 | Admitting: Internal Medicine

## 2020-10-05 ENCOUNTER — Encounter: Payer: Self-pay | Admitting: Internal Medicine

## 2020-10-05 DIAGNOSIS — J4521 Mild intermittent asthma with (acute) exacerbation: Secondary | ICD-10-CM | POA: Diagnosis not present

## 2020-10-05 DIAGNOSIS — R059 Cough, unspecified: Secondary | ICD-10-CM

## 2020-10-05 DIAGNOSIS — G43809 Other migraine, not intractable, without status migrainosus: Secondary | ICD-10-CM | POA: Diagnosis not present

## 2020-10-05 DIAGNOSIS — U071 COVID-19: Secondary | ICD-10-CM | POA: Insufficient documentation

## 2020-10-05 HISTORY — DX: COVID-19: U07.1

## 2020-10-05 MED ORDER — BENZONATATE 100 MG PO CAPS: 100.0000 mg | ORAL_CAPSULE | Freq: Three times a day (TID) | ORAL | 0 refills | Status: DC | PRN

## 2020-10-05 MED ORDER — ALBUTEROL SULFATE HFA 108 (90 BASE) MCG/ACT IN AERS: INHALATION_SPRAY | RESPIRATORY_TRACT | 1 refills | Status: DC

## 2020-10-05 MED ORDER — PREDNISONE 10 MG PO TABS: ORAL_TABLET | ORAL | 0 refills | Status: DC

## 2020-10-05 NOTE — Progress Notes (Signed)
Name: Loretta Jensen   MRN: [id number]    DOB: 1986/06/05   Date:10/05/2020       Progress Note  Subjective  Chief Complaint  Chief Complaint  Patient presents with  . Covid Positive  . Cough  . Migraine    I connected with  Loretta Jensen on 10/05/20 at  8:20 AM EST by telephone and verified that I am speaking with the correct person using two identifiers.  I discussed the limitations, risks, security and privacy concerns of performing an evaluation and management service by telephone and the availability of in person appointments. The patient expressed understanding and agreed to proceed. Staff also discussed with the patient that there may be a patient responsible charge related to this service. Patient Location: Home Provider Location: [facility] Additional Individuals present: none  HPI Patient is a 35 year old female patient of Loretta Jensen Last visit with her was in June 2021 Follows up today with symptoms and COVID-positive During our conversation, she often was interrupted with a conversation with her boyfriend, and tried to repeat things so that she was understanding of the recommendations  COVID immunization status - had vaccine X2, not yet due for booster Patient with Covid, test result was positive on 09/27/20 Sx's started about on 1/6  Has two kids under age of three taking care of, have mild cough and they were + for Covid  Notes symptoms that persist include: + cough, on and off, + production, bright yellow phlegm No marked SOB at rest, mostly when up and about Not wheezing No  Fever lately, was 99.8 last night,  feeling feverish at night usually No sore throat and mild congestion/PND off and on No loss of smell, loss of taste, appetite remains good Mild N/+ V last night with migraine, otherwise no vomiting No muscle aches No marked loose stools/diarrhea since a few nights ago when was brief No CP, passing out episodes + migraine HA's -had 1 last night,  takes excedrin usually and not have any, took tylenol last night and better now although can feel it coming back   Tried tessalon for her cough and was helpful and ran out, is also running out of her albuterol inhaler. Tobacco-never smoker Comorbid conditions reviewed  + asthma - not have Symbicort, had been using off and on although admits not used in the recent past, is using Singulair to manage, also has an albuterol inhaler to use as needed, has been using about 2X/ day in recent past No h/o DM, heart disease, CKD,  + obesity  Staying isolated presently   Patient Active Problem List   Diagnosis Date Noted  . Reactive airway disease 03/17/2020  . Chronic midline low back pain without sciatica 03/17/2020  . Family history of breast cancer   . Seasonal allergic rhinitis 01/19/2019  . Family history of breast cancer in mother 01/19/2019  . Menorrhagia with irregular cycle 01/19/2019  . Nausea and vomiting during pregnancy 04/24/2018  . BMI 34.0-34.9,adult 03/04/2018  . Migraine headache 01/23/2018  . Occupational exposure to dust 05/23/2015    Past Surgical History:  Procedure Laterality Date  . CESAREAN SECTION  2009  . CESAREAN SECTION N/A 03/02/2017   Procedure: CESAREAN SECTION;  Surgeon: Conard Novak, MD;  Location: [facility] ORS;  Service: Obstetrics;  Laterality: N/A;  . CESAREAN SECTION N/A 07/10/2018   Procedure: CESAREAN SECTION;  Surgeon: Conard Novak, MD;  Location: [facility] ORS;  Service: Obstetrics;  Laterality: N/A;  . TUBAL LIGATION Bilateral 07/10/2018  Procedure: BILATERAL TUBAL LIGATION;  Surgeon: Conard Novak, MD;  Location: [facility] ORS;  Service: Obstetrics;  Laterality: Bilateral;    Family History  Problem Relation Age of Onset  . Cancer Mother   . Breast cancer Mother 90  . Cancer Maternal Grandfather        2018 - liver  . Breast cancer Other 75       2018  . Breast cancer Other 55       2013  . Breast cancer Other        1990s  . Cancer  Paternal Grandmother        unk type  . Breast cancer Other     Social History   Tobacco Use  . Smoking status: Never Smoker  . Smokeless tobacco: Never Used  Substance Use Topics  . Alcohol use: Yes    Alcohol/week: 0.0 standard drinks    Comment: occasionally     Current Outpatient Medications:  .  albuterol (VENTOLIN HFA) 108 (90 Base) MCG/ACT inhaler, INHALE 2 PUFFS INTO THE LUNGS EVERY 6 HOURS AS NEEDED FOR WHEEZING OR SHORTNESS OF BREATH, Disp: 18 g, Rfl: 3 .  azelastine (ASTELIN) 0.1 % nasal spray, Place 2 sprays into both nostrils 2 (two) times daily. Use in each nostril as directed, Disp: 30 mL, Rfl: 12 .  benzonatate (TESSALON) 100 MG capsule, Take 1 capsule (100 mg total) by mouth 3 (three) times daily as needed for cough., Disp: 30 capsule, Rfl: 0 .  budesonide-formoterol (SYMBICORT) 80-4.5 MCG/ACT inhaler, Inhale 2 puffs into the lungs 2 (two) times daily., Disp: 1 Inhaler, Rfl: 3 .  fluticasone (FLONASE) 50 MCG/ACT nasal spray, Place 2 sprays into both nostrils daily., Disp: 16 g, Rfl: 6 .  ibuprofen (ADVIL) 600 MG tablet, TAKE 1 TABLET BY MOUTH EVERY 8 HOURS AS NEEDED FOR HEADACHE, MILD PAIN, MODERATE PAIN OR CRAMPING., Disp: 90 tablet, Rfl: 1 .  levocetirizine (XYZAL) 5 MG tablet, TAKE 1 TABLET(5 MG) BY MOUTH EVERY EVENING, Disp: 90 tablet, Rfl: 2 .  loratadine (CLARITIN) 10 MG tablet, Take 1 tablet (10 mg total) by mouth every morning., Disp: 90 tablet, Rfl: 2 .  montelukast (SINGULAIR) 10 MG tablet, Take 1 tablet (10 mg total) by mouth at bedtime., Disp: 90 tablet, Rfl: 3 .  Olopatadine HCl 0.2 % SOLN, Apply 1 drop to eye daily., Disp: 2.5 mL, Rfl: 3  No Known Allergies  With staff assistance, above reviewed with the patient today.  ROS: As per HPI, otherwise no specific complaints on a limited and focused system review   Objective  Virtual encounter, vitals not obtained.  There is no height or weight on file to calculate BMI.  Physical Exam   Appears in  NAD via conversation Pulmonary/Chest: No obvious respiratory distress. Speaking in complete sentences Neurological: Pt is alert, Speech is normal Psychiatric: Patient has a normal mood and affect, behavior is normal. Judgment and thought content normal.   No results found for this or any previous visit (from the past 72 hour(s)).  PHQ2/9: Depression screen [facility] 2/9 10/05/2020 03/17/2020 11/19/2019 11/19/2019 07/09/2019  Decreased Interest 0 0 0 0 0  Down, Depressed, Hopeless 0 0 0 0 0  PHQ - 2 Score 0 0 0 0 0  Altered sleeping - 0 0 0 0  Tired, decreased energy - 1 0 0 0  Change in appetite - 1 0 0 0  Feeling bad or failure about yourself  - 0 0 0 0  Trouble concentrating -  0 0 0 0  Moving slowly or fidgety/restless - 1 0 0 0  Suicidal thoughts - 0 0 0 0  PHQ-9 Score - 3 0 0 0  Difficult doing work/chores - Somewhat difficult Not difficult at all Not difficult at all Not difficult at all  Some recent data might be hidden   PHQ-2/9 Result reviewed  Fall Risk: Fall Risk  10/05/2020 03/17/2020 11/19/2019 11/19/2019 07/09/2019  Falls in the past year? 0 1 0 0 0  Number falls in past yr: 0 0 0 0 0  Injury with Fall? 0 0 0 0 0  Follow up - - - - Falls evaluation completed     Assessment & Plan  1. Mild intermittent reactive airway disease with acute exacerbation 2. COVID Patient presently with COVID, and both of her very young children with COVID as well.  She is still struggling with coughing episodes, short of breath with these episodes, and is running out of her albuterol inhaler to use.  Denies higher fevers or chest pains. The treatment options for COVID that including antibody infusions or oral pills are in very limited supply, and noting the triaging for availability presently, these are almost assuredly not an option presently.  Do feel continuing to manage symptomatically is important and close monitoring of symptoms as discussed, and we will proceed as follows:  Refilled her  albuterol inhaler, and recommended she use it routinely 2-3 times a day to help with symptoms and clearance, and then as symptoms improving, return to just as needed use. We will add an oral prednisone product in a weaning fashion as ordered to help.  She states she has had these in the past and has been helpful. Also can refill her Tessalon product as she notes that has been helpful for her cough. Also recommended a Mucinex or Robitussin type product as an expectorant to help Can use Tylenol or ibuprofen products for any low-grade temperatures or achiness. Also continue with rest, isolation, and staying well-hydrated important.  3. Other migraine without status migrainosus, not intractable She did note she can get more of the Excedrin Migraine product which has been helpful for her migraine headache and recommended that she do so.  Emphasized following up if symptoms not improving or more problematic despite the above, and did note if her symptoms worsen more acutely, with increased shortness of breath, chest pains, higher fevers, she needs to be seen more emergently and do not have respiratory clinics available to see patients with COVID as needed or can use an emergency facility as well if more emergent.  Did recommend to follow-up with her PCP when symptoms have improved, as does need to assess her maintenance medication status for her asthma, specifically if the Symbicort inhaler needs to continue for maintenance management in the future   I discussed the assessment and treatment plan with the patient. The patient was provided an opportunity to ask questions and all were answered. The patient agreed with the plan and demonstrated an understanding of the instructions.  Red flags and when to present for emergency care or RTC including fevers, chest pain, shortness of breath, new/worsening/un-resolving symptoms reviewed with patient at time of visit.   The patient was advised to call back or  seek an in-person evaluation if the symptoms worsen or if the condition fails to improve as anticipated.  I provided 20 minutes of non-face-to-face time during this encounter that included discussing at length patient's sx/history, pertinent pmhx, medications, treatment and follow up  plan. This time also included the necessary documentation, orders, and chart review.  Jamelle Haring, MD

## 2020-10-06 ENCOUNTER — Telehealth: Payer: Self-pay

## 2020-10-06 NOTE — Telephone Encounter (Signed)
Work release note

## 2020-10-30 ENCOUNTER — Ambulatory Visit (INDEPENDENT_AMBULATORY_CARE_PROVIDER_SITE_OTHER): Payer: 59 | Admitting: Family Medicine

## 2020-10-30 ENCOUNTER — Encounter: Payer: Self-pay | Admitting: Family Medicine

## 2020-10-30 ENCOUNTER — Other Ambulatory Visit: Payer: Self-pay

## 2020-10-30 VITALS — BP 120/70 | HR 98 | Temp 98.4°F | Resp 18 | Ht 64.0 in | Wt 206.3 lb

## 2020-10-30 DIAGNOSIS — J302 Other seasonal allergic rhinitis: Secondary | ICD-10-CM | POA: Diagnosis not present

## 2020-10-30 DIAGNOSIS — G43809 Other migraine, not intractable, without status migrainosus: Secondary | ICD-10-CM | POA: Diagnosis not present

## 2020-10-30 DIAGNOSIS — J989 Respiratory disorder, unspecified: Secondary | ICD-10-CM

## 2020-10-30 DIAGNOSIS — J4521 Mild intermittent asthma with (acute) exacerbation: Secondary | ICD-10-CM | POA: Diagnosis not present

## 2020-10-30 DIAGNOSIS — R0602 Shortness of breath: Secondary | ICD-10-CM

## 2020-10-30 DIAGNOSIS — U071 COVID-19: Secondary | ICD-10-CM

## 2020-10-30 DIAGNOSIS — R059 Cough, unspecified: Secondary | ICD-10-CM

## 2020-10-30 MED ORDER — MONTELUKAST SODIUM 10 MG PO TABS: 10.0000 mg | ORAL_TABLET | Freq: Every day | ORAL | 3 refills | Status: DC

## 2020-10-30 MED ORDER — BENZONATATE 100 MG PO CAPS: 100.0000 mg | ORAL_CAPSULE | Freq: Three times a day (TID) | ORAL | 1 refills | Status: DC | PRN

## 2020-10-30 MED ORDER — LEVOCETIRIZINE DIHYDROCHLORIDE 5 MG PO TABS: ORAL_TABLET | ORAL | 2 refills | Status: DC

## 2020-10-30 MED ORDER — LORATADINE 10 MG PO TABS: 10.0000 mg | ORAL_TABLET | ORAL | 2 refills | Status: DC

## 2020-10-30 MED ORDER — BUDESONIDE-FORMOTEROL FUMARATE 80-4.5 MCG/ACT IN AERO: 2.0000 | INHALATION_SPRAY | Freq: Two times a day (BID) | RESPIRATORY_TRACT | 5 refills | Status: DC

## 2020-10-30 NOTE — Progress Notes (Signed)
Name: Loretta Jensen   MRN: [id number]    DOB: 02-10-1986   Date:10/30/2020       Progress Note  Chief Complaint  Patient presents with   Follow-up   Asthma     Subjective:   Loretta Jensen is a 35 y.o. female, presents to clinic for f/up on asthma  Pt with hx of asthma, recent COVID infection, sx on 1/6, positive test on 1/5 - just over a month ago. Pt had URI sx, some mild cough, had been vaccinated x 2 Asthma managed with symbicort - though she is none compliant  Singular and albuterol  During illness was using albuterol 2x a day at least    She was coughing, productive, the steroids helped a little bit   10/30/20 1522  Asthma History  Symptoms 0-2 days/week  Nighttime Awakenings 3-4/month  Asthma interference with normal activity Minor limitations  SABA use (not for EIB) > 2 days/wk--not > 1 x/day  Risk: Exacerbations requiring oral systemic steroids 0-1 / year  Asthma Severity Mild Persistent       Current Outpatient Medications:    albuterol (VENTOLIN HFA) 108 (90 Base) MCG/ACT inhaler, INHALE 2 PUFFS INTO THE LUNGS EVERY 6 HOURS AS NEEDED FOR WHEEZING OR SHORTNESS OF BREATH.  Would use routinely 2-3 times daily until present symptoms improve, then return to as needed use, Disp: 6.7 g, Rfl: 1   benzonatate (TESSALON) 100 MG capsule, Take 1 capsule (100 mg total) by mouth 3 (three) times daily as needed for cough., Disp: 30 capsule, Rfl: 0   budesonide-formoterol (SYMBICORT) 80-4.5 MCG/ACT inhaler, Inhale 2 puffs into the lungs 2 (two) times daily., Disp: 1 Inhaler, Rfl: 3   fluticasone (FLONASE) 50 MCG/ACT nasal spray, Place 2 sprays into both nostrils daily., Disp: 16 g, Rfl: 6   ibuprofen (ADVIL) 600 MG tablet, TAKE 1 TABLET BY MOUTH EVERY 8 HOURS AS NEEDED FOR HEADACHE, MILD PAIN, MODERATE PAIN OR CRAMPING., Disp: 90 tablet, Rfl: 1   levocetirizine (XYZAL) 5 MG tablet, TAKE 1 TABLET(5 MG) BY MOUTH EVERY EVENING, Disp: 90 tablet, Rfl: 2    loratadine (CLARITIN) 10 MG tablet, Take 1 tablet (10 mg total) by mouth every morning., Disp: 90 tablet, Rfl: 2   montelukast (SINGULAIR) 10 MG tablet, Take 1 tablet (10 mg total) by mouth at bedtime., Disp: 90 tablet, Rfl: 3   azelastine (ASTELIN) 0.1 % nasal spray, Place 2 sprays into both nostrils 2 (two) times daily. Use in each nostril as directed (Patient not taking: Reported on 10/30/2020), Disp: 30 mL, Rfl: 12   Olopatadine HCl 0.2 % SOLN, Apply 1 drop to eye daily. (Patient not taking: Reported on 10/30/2020), Disp: 2.5 mL, Rfl: 3   predniSONE (DELTASONE) 10 MG tablet, Take four tabs daily X 2 days, then two tabs daily X 2 days, then one tab daily X 2 days (Patient not taking: Reported on 10/30/2020), Disp: 14 tablet, Rfl: 0  Patient Active Problem List   Diagnosis Date Noted   COVID 10/05/2020   Reactive airway disease 03/17/2020   Chronic midline low back pain without sciatica 03/17/2020   Family history of breast cancer    Seasonal allergic rhinitis 01/19/2019   Family history of breast cancer in mother 01/19/2019   Menorrhagia with irregular cycle 01/19/2019   Nausea and vomiting during pregnancy 04/24/2018   BMI 34.0-34.9,adult 03/04/2018   Migraine headache 01/23/2018   Occupational exposure to dust 05/23/2015    Past Surgical History:  Procedure Laterality Date  CESAREAN SECTION  2009   CESAREAN SECTION N/A 03/02/2017   Procedure: CESAREAN SECTION;  Surgeon: Conard Novak, MD;  Location: [facility];  Service: Obstetrics;  Laterality: N/A;   CESAREAN SECTION N/A 07/10/2018   Procedure: CESAREAN SECTION;  Surgeon: Conard Novak, MD;  Location: [facility];  Service: Obstetrics;  Laterality: N/A;   TUBAL LIGATION Bilateral 07/10/2018   Procedure: BILATERAL TUBAL LIGATION;  Surgeon: Conard Novak, MD;  Location: [facility];  Service: Obstetrics;  Laterality: Bilateral;    Family History  Problem Relation Age of Onset   Cancer Mother    Breast  cancer Mother 23   Cancer Maternal Grandfather        2018 - liver   Breast cancer Other 71       2018   Breast cancer Other 55       2013   Breast cancer Other        1990s   Cancer Paternal Grandmother        unk type   Breast cancer Other     Social History   Tobacco Use   Smoking status: Never Smoker   Smokeless tobacco: Never Used  Vaping Use   Vaping Use: Never used  Substance Use Topics   Alcohol use: Yes    Alcohol/week: 0.0 standard drinks    Comment: occasionally   Drug use: No     No Known Allergies  Health Maintenance  Topic Date Due   COVID-19 Vaccine (3 - Booster for Moderna series) 11/19/2020   PAP SMEAR-Modifier  08/19/2021   TETANUS/TDAP  07/11/2027   INFLUENZA VACCINE  Completed   Hepatitis C Screening  Completed   HIV Screening  Completed    Chart Review Today: I personally reviewed active problem list, medication list, allergies, family history, social history, health maintenance, notes from last encounter, lab results, imaging with the patient/caregiver today.   Review of Systems  All other Systems reviewed and are negative for acute change except as noted in the HPI.  Objective:   Vitals:   10/30/20 1452  BP: 120/70  Pulse: 98  Resp: 18  Temp: 98.4 F (36.9 C)  SpO2: 99%  Weight: 206 lb 4.8 oz (93.6 kg)  Height: 5\' 4"  (1.626 m)    Body mass index is 35.41 kg/m.  Physical Exam Vitals and nursing note reviewed.  Constitutional:      General: She is not in acute distress.    Appearance: Normal appearance. She is well-developed. She is not ill-appearing, toxic-appearing or diaphoretic.     Interventions: Face mask in place.  HENT:     Head: Normocephalic and atraumatic.     Right Ear: External ear normal.     Left Ear: External ear normal.     Nose: Congestion and rhinorrhea present.     Mouth/Throat:     Mouth: Mucous membranes are moist.     Pharynx: No oropharyngeal exudate.  Eyes:     General: Lids  are normal. No scleral icterus.       Right eye: No discharge.        Left eye: No discharge.     Conjunctiva/sclera: Conjunctivae normal.     Pupils: Pupils are equal, round, and reactive to light.  Neck:     Trachea: Phonation normal. No tracheal deviation.  Cardiovascular:     Rate and Rhythm: Normal rate and regular rhythm.     Pulses: Normal pulses.  Radial pulses are 2+ on the right side and 2+ on the left side.       Posterior tibial pulses are 2+ on the right side and 2+ on the left side.     Heart sounds: Normal heart sounds. No murmur heard. No friction rub. No gallop.   Pulmonary:     Effort: Pulmonary effort is normal. No respiratory distress.     Breath sounds: Normal breath sounds. No stridor. No wheezing, rhonchi or rales.     Comments: freq coughing, faint exp wheeze, speaking in full and complete sentences no stridor, respiratory distress, retractions or accessory muscle use Chest:     Chest wall: No tenderness.  Abdominal:     General: Bowel sounds are normal. There is no distension.     Palpations: Abdomen is soft.  Musculoskeletal:     Right lower leg: No edema.     Left lower leg: No edema.  Skin:    General: Skin is warm and dry.     Coloration: Skin is not jaundiced or pale.     Findings: No rash.  Neurological:     Mental Status: She is alert.     Motor: No abnormal muscle tone.     Gait: Gait normal.  Psychiatric:        Mood and Affect: Mood normal.        Speech: Speech normal.        Behavior: Behavior normal.         Assessment & Plan:   1. Mild intermittent reactive airway disease with acute exacerbation Pt finished steroid burst but it not back to her baseline yet, restart singulair, continue other allergy OTC meds, do daily maintenance inhaler and continue cough meds, nebs and prn rescue inhaler F/up if in 1-2 months if not returning to baseline - can refer to pulm for further eval - montelukast (SINGULAIR) 10 MG tablet; Take 1  tablet (10 mg total) by mouth at bedtime.  Dispense: 90 tablet; Refill: 3  2. Other migraine without status migrainosus, not intractable Triggered by COVID and URI -   3. Seasonal allergic rhinitis, unspecified trigger - levocetirizine (XYZAL) 5 MG tablet; TAKE 1 TABLET(5 MG) BY MOUTH EVERY EVENING  Dispense: 90 tablet; Refill: 2 - loratadine (CLARITIN) 10 MG tablet; Take 1 tablet (10 mg total) by mouth every morning.  Dispense: 90 tablet; Refill: 2 - montelukast (SINGULAIR) 10 MG tablet; Take 1 tablet (10 mg total) by mouth at bedtime.  Dispense: 90 tablet; Refill: 3  4. Cough - budesonide-formoterol (SYMBICORT) 80-4.5 MCG/ACT inhaler; Inhale 2 puffs into the lungs 2 (two) times daily.  Dispense: 1 each; Refill: 5 - benzonatate (TESSALON) 100 MG capsule; Take 1-2 capsules (100-200 mg total) by mouth 3 (three) times daily as needed for cough.  Dispense: 60 capsule; Refill: 1  5. Shortness of breath - budesonide-formoterol (SYMBICORT) 80-4.5 MCG/ACT inhaler; Inhale 2 puffs into the lungs 2 (two) times daily.  Dispense: 1 each; Refill: 5  6. Respiratory illness - budesonide-formoterol (SYMBICORT) 80-4.5 MCG/ACT inhaler; Inhale 2 puffs into the lungs 2 (two) times daily.  Dispense: 1 each; Refill: 5 - benzonatate (TESSALON) 100 MG capsule; Take 1-2 capsules (100-200 mg total) by mouth 3 (three) times daily as needed for cough.  Dispense: 60 capsule; Refill: 1  7. COVID Supportive and sx tx discussed - benzonatate (TESSALON) 100 MG capsule; Take 1-2 capsules (100-200 mg total) by mouth 3 (three) times daily as needed for cough.  Dispense: 60 capsule;  Refill: 1    Return for 1 month f/up asthma and COVID .   Danelle Berry, PA-C 10/30/20 3:05 PM

## 2020-10-30 NOTE — Patient Instructions (Signed)
Bring in or fax in FMLA with job description and schedule for [place] to try and complete - we may need to talk on the phone to complete all aspects of the form  F/up in one month for asthma  Restart all allergy and asthma controlled med - montelukast, antihistamines, symbicort daily and then use rescue inhaler as needed   Please let me know if you need a nebulizer machine for bigger breathing treatments

## 2020-10-30 NOTE — Progress Notes (Signed)
   10/30/20 1522  Asthma History  Symptoms 0-2 days/week  Nighttime Awakenings 3-4/month  Asthma interference with normal activity Minor limitations  SABA use (not for EIB) > 2 days/wk--not > 1 x/day  Risk: Exacerbations requiring oral systemic steroids 0-1 / year  Asthma Severity Mild Persistent

## 2020-11-03 ENCOUNTER — Encounter: Payer: Self-pay | Admitting: Family Medicine

## 2020-12-01 ENCOUNTER — Ambulatory Visit: Payer: 59 | Admitting: Family Medicine

## 2020-12-05 ENCOUNTER — Other Ambulatory Visit: Payer: Self-pay | Admitting: Emergency Medicine

## 2020-12-05 DIAGNOSIS — J4521 Mild intermittent asthma with (acute) exacerbation: Secondary | ICD-10-CM

## 2020-12-05 DIAGNOSIS — U071 COVID-19: Secondary | ICD-10-CM

## 2020-12-05 MED ORDER — ALBUTEROL SULFATE HFA 108 (90 BASE) MCG/ACT IN AERS: INHALATION_SPRAY | RESPIRATORY_TRACT | 1 refills | Status: DC

## 2020-12-06 ENCOUNTER — Encounter: Payer: Self-pay | Admitting: Family Medicine

## 2020-12-22 ENCOUNTER — Ambulatory Visit (INDEPENDENT_AMBULATORY_CARE_PROVIDER_SITE_OTHER): Payer: 59 | Admitting: Physician Assistant

## 2020-12-22 ENCOUNTER — Encounter: Payer: Self-pay | Admitting: Physician Assistant

## 2020-12-22 ENCOUNTER — Other Ambulatory Visit: Payer: Self-pay

## 2020-12-22 VITALS — BP 112/84 | HR 93 | Temp 98.1°F | Resp 18 | Ht 64.0 in | Wt 205.1 lb

## 2020-12-22 DIAGNOSIS — J302 Other seasonal allergic rhinitis: Secondary | ICD-10-CM | POA: Diagnosis not present

## 2020-12-22 DIAGNOSIS — J4521 Mild intermittent asthma with (acute) exacerbation: Secondary | ICD-10-CM

## 2020-12-22 MED ORDER — TRELEGY ELLIPTA 100-62.5-25 MCG/INH IN AEPB: 1.0000 | INHALATION_SPRAY | Freq: Every day | RESPIRATORY_TRACT | 2 refills | Status: DC

## 2020-12-22 NOTE — Progress Notes (Signed)
Established patient visit   Patient: Loretta Jensen   DOB: [birth year]   35 y.o. Female  MRN: [id number] Visit Date: 12/22/2020  Today's healthcare provider: Trey Sailors, PA-C   Chief Complaint  Patient presents with  . Asthma    1 month follow up  . Allergic Rhinitis    Subjective    HPI HPI    Asthma    Comments: 1 month follow up       Last edited by Riesa Pope, RMA on 12/22/2020 12:59 PM. (History)      Patient presents today for follow up of asthma and allergies. Patient is currently on symbicort, singulair. Reports frequent allergies and asthmatic symptoms during work. She works as a Stage manager and bends down frequently. She reports she is near three ovens during work which emit smoke. She reports difficulty breathing during this. She has never seen an allergist of pulmonologist. Reports when she gets sick during allergy season she can be sick for a week at a time.      Medications: Outpatient Medications Prior to Visit  Medication Sig Note  . albuterol (VENTOLIN HFA) 108 (90 Base) MCG/ACT inhaler INHALE 2 PUFFS INTO THE LUNGS EVERY 6 HOURS AS NEEDED FOR WHEEZING OR SHORTNESS OF BREATH.  Would use routinely 2-3 times daily until present symptoms improve, then return to as needed use   . fluticasone (FLONASE) 50 MCG/ACT nasal spray Place 2 sprays into both nostrils daily.   Marland Kitchen ibuprofen (ADVIL) 600 MG tablet TAKE 1 TABLET BY MOUTH EVERY 8 HOURS AS NEEDED FOR HEADACHE, MILD PAIN, MODERATE PAIN OR CRAMPING. 12/22/2020: prn  . levocetirizine (XYZAL) 5 MG tablet TAKE 1 TABLET(5 MG) BY MOUTH EVERY EVENING   . loratadine (CLARITIN) 10 MG tablet Take 1 tablet (10 mg total) by mouth every morning.   . montelukast (SINGULAIR) 10 MG tablet Take 1 tablet (10 mg total) by mouth at bedtime.   . [DISCONTINUED] budesonide-formoterol (SYMBICORT) 80-4.5 MCG/ACT inhaler Inhale 2 puffs into the lungs 2 (two) times daily.   . [DISCONTINUED] benzonatate (TESSALON) 100 MG  capsule Take 1-2 capsules (100-200 mg total) by mouth 3 (three) times daily as needed for cough. (Patient not taking: Reported on 12/22/2020) 12/22/2020: prn   No facility-administered medications prior to visit.    Review of Systems     Objective    BP 112/84   Pulse 93   Temp 98.1 F (36.7 C) (Oral)   Resp 18   Ht 5\' 4"  (1.626 m)   Wt 205 lb 1.6 oz (93 kg)   SpO2 99%   BMI 35.21 kg/m     Physical Exam Constitutional:      Appearance: Normal appearance.  Cardiovascular:     Rate and Rhythm: Normal rate and regular rhythm.     Heart sounds: Normal heart sounds.  Pulmonary:     Effort: Pulmonary effort is normal.     Breath sounds: Normal breath sounds.  Skin:    General: Skin is warm and dry.  Neurological:     Mental Status: She is alert and oriented to person, place, and time. Mental status is at baseline.  Psychiatric:        Mood and Affect: Mood normal.        Behavior: Behavior normal.       No results found for any visits on 12/22/20.  Assessment & Plan    1. Mild intermittent reactive airway disease with acute exacerbation  Change from symbicort  to trelegy for hopefully better control. Will send to allergy and asthma for more input on allergy triggers/asthma control. May be candidate for allergy shots and/or alternative asthma therapy.  - Fluticasone-Umeclidin-Vilant (TRELEGY ELLIPTA) 100-62.5-25 MCG/INH AEPB; Inhale 1 puff into the lungs daily.  Dispense: 1 each; Refill: 2 - Ambulatory referral to Allergy  2. Seasonal allergic rhinitis, unspecified trigger  - Ambulatory referral to Allergy   Return if symptoms worsen or fail to improve.         Trey Sailors, PA-C  [facility] [phone] (phone) [phone] (fax)  [facility]

## 2020-12-22 NOTE — Patient Instructions (Signed)
http://www.aaaai.org/conditions-and-treatments/asthma">  Asthma, Adult  Asthma is a long-term (chronic) condition that causes recurrent episodes in which the airways become tight and narrow. The airways are the passages that lead from the nose and mouth down into the lungs. Asthma episodes, also called asthma attacks, can cause coughing, wheezing, shortness of breath, and chest pain. The airways can also fill with mucus. During an attack, it can be difficult to breathe. Asthma attacks can range from minor to life threatening. Asthma cannot be cured, but medicines and lifestyle changes can help control it and treat acute attacks. What are the causes? This condition is believed to be caused by inherited (genetic) and environmental factors, but its exact cause is not known. There are many things that can bring on an asthma attack or make asthma symptoms worse (triggers). Asthma triggers are different for each person. Common triggers include:  Mold.  Dust.  Cigarette smoke.  Cockroaches.  Things that can cause allergy symptoms (allergens), such as animal dander or pollen from trees or grass.  Air pollutants such as household cleaners, wood smoke, smog, or chemical odors.  Cold air, weather changes, and winds (which increase molds and pollen in the air).  Strong emotional expressions such as crying or laughing hard.  Stress.  Certain medicines (such as aspirin) or types of medicines (such as beta-blockers).  Sulfites in foods and drinks. Foods and drinks that may contain sulfites include dried fruit, potato chips, and sparkling grape juice.  Infections or inflammatory conditions such as the flu, a cold, or inflammation of the nasal membranes (rhinitis).  Gastroesophageal reflux disease (GERD).  Exercise or strenuous activity. What are the signs or symptoms? Symptoms of this condition may occur right after asthma is triggered or many hours later. Symptoms include:  Wheezing. This can  sound like whistling when you breathe.  Excessive nighttime or early morning coughing.  Frequent or severe coughing with a common cold.  Chest tightness.  Shortness of breath.  Tiredness (fatigue) with minimal activity. How is this diagnosed? This condition is diagnosed based on:  Your medical history.  A physical exam.  Tests, which may include: ? Lung function studies and pulmonary studies (spirometry). These tests can evaluate the flow of air in your lungs. ? Allergy tests. ? Imaging tests, such as X-rays. How is this treated? There is no cure for this condition, but treatment can help control your symptoms. Treatment for asthma usually involves:  Identifying and avoiding your asthma triggers.  Using medicines to control your symptoms. Generally, two types of medicines are used to treat asthma: ? Controller medicines. These help prevent asthma symptoms from occurring. They are usually taken every day. ? Fast-acting reliever or rescue medicines. These quickly relieve asthma symptoms by widening the narrow and tight airways. They are used as needed and provide short-term relief.  Using supplemental oxygen. This may be needed during a severe episode.  Using other medicines, such as: ? Allergy medicines, such as antihistamines, if your asthma attacks are triggered by allergens. ? Immune medicines (immunomodulators). These are medicines that help control the immune system.  Creating an asthma action plan. An asthma action plan is a written plan for managing and treating your asthma attacks. This plan includes: ? A list of your asthma triggers and how to avoid them. ? Information about when medicines should be taken and when their dosage should be changed. ? Instructions about using a device called a peak flow meter. A peak flow meter measures how well the lungs are working   and the severity of your asthma. It helps you monitor your condition. Follow these instructions at  home: Controlling your home environment Control your home environment in the following ways to help avoid triggers and prevent asthma attacks:  Change your heating and air conditioning filter regularly.  Limit your use of fireplaces and wood stoves.  Get rid of pests (such as roaches and mice) and their droppings.  Throw away plants if you see mold on them.  Clean floors and dust surfaces regularly. Use unscented cleaning products.  Try to have someone else vacuum for you regularly. Stay out of rooms while they are being vacuumed and for a short while afterward. If you vacuum, use a dust mask from a hardware store, a double-layered or microfilter vacuum cleaner bag, or a vacuum cleaner with a HEPA filter.  Replace carpet with wood, tile, or vinyl flooring. Carpet can trap dander and dust.  Use allergy-proof pillows, mattress covers, and box spring covers.  Keep your bedroom a trigger-free room.  Avoid pets and keep windows closed when allergens are in the air.  Wash beddings every week in hot water and dry them in a dryer.  Use blankets that are made of polyester or cotton.  Clean bathrooms and kitchens with bleach. If possible, have someone repaint the walls in these rooms with mold-resistant paint. Stay out of the rooms that are being cleaned and painted.  Wash your hands often with soap and water. If soap and water are not available, use hand sanitizer.  Do not allow anyone to smoke in your home. General instructions  Take over-the-counter and prescription medicines only as told by your health care provider. ? Speak with your health care provider if you have questions about how or when to take the medicines. ? Make note if you are requiring more frequent dosages.  Do not use any products that contain nicotine or tobacco, such as cigarettes and e-cigarettes. If you need help quitting, ask your health care provider. Also, avoid being exposed to secondhand smoke.  Use a peak  flow meter as told by your health care provider. Record and keep track of the readings.  Understand and use the asthma action plan to help minimize, or stop an asthma attack, without needing to seek medical care.  Make sure you stay up to date on your yearly vaccinations as told by your health care provider. This may include vaccines for the flu and pneumonia.  Avoid outdoor activities when allergen counts are high and when air quality is low.  Wear a ski mask that covers your nose and mouth during outdoor winter activities. Exercise indoors on cold days if you can.  Warm up before exercising, and take time for a cool-down period after exercise.  Keep all follow-up visits as told by your health care provider. This is important. Where to find more information  For information about asthma, turn to the Centers for Disease Control and Prevention at www.cdc.gov/asthma/faqs  For air quality information, turn to AirNow at airnow.gov Contact a health care provider if:  You have wheezing, shortness of breath, or a cough even while you are taking medicine to prevent attacks.  The mucus you cough up (sputum) is thicker than usual.  Your sputum changes from clear or white to yellow, green, gray, or bloody.  Your medicines are causing side effects, such as a rash, itching, swelling, or trouble breathing.  You need to use a reliever medicine more than 2-3 times a week.  Your peak   flow reading is still at 50-79% of your personal best after following your action plan for 1 hour.  You have a fever. Get help right away if:  You are getting worse and do not respond to treatment during an asthma attack.  You are short of breath when at rest or when doing very little physical activity.  You have difficulty eating, drinking, or talking.  You have chest pain or tightness.  You develop a fast heartbeat or palpitations.  You have a bluish color to your lips or fingernails.  You are  light-headed or dizzy, or you faint.  Your peak flow reading is less than 50% of your personal best.  You feel too tired to breathe normally. Summary  Asthma is a long-term (chronic) condition that causes recurrent episodes in which the airways become tight and narrow. These episodes can cause coughing, wheezing, shortness of breath, and chest pain.  Asthma cannot be cured, but medicines and lifestyle changes can help control it and treat acute attacks.  Make sure you understand how to avoid triggers and how and when to use your medicines.  Asthma attacks can range from minor to life threatening. Get help right away if you have an asthma attack and do not respond to treatment with your usual rescue medicines. This information is not intended to replace advice given to you by your health care provider. Make sure you discuss any questions you have with your health care provider. Document Revised: 06/09/2020 Document Reviewed: 01/12/2020 Elsevier Patient Education  2021 [employer]  

## 2021-01-18 ENCOUNTER — Encounter: Payer: Self-pay | Admitting: Physician Assistant

## 2021-01-18 ENCOUNTER — Telehealth: Payer: 59 | Admitting: Physician Assistant

## 2021-01-18 DIAGNOSIS — J3089 Other allergic rhinitis: Secondary | ICD-10-CM | POA: Diagnosis not present

## 2021-01-18 MED ORDER — OXYMETAZOLINE HCL 0.05 % NA SOLN: 1.0000 | Freq: Two times a day (BID) | NASAL | 0 refills | Status: DC

## 2021-01-18 MED ORDER — OLOPATADINE HCL 0.2 % OP SOLN
OPHTHALMIC | 0 refills | Status: AC
Start: 2021-01-18 — End: ?

## 2021-01-18 NOTE — Patient Instructions (Signed)
Olopatadine eye solution What is this medicine? OLOPATADINE (oh loe pa TA deen) drops are used in the eye to treat symptoms caused by allergies. This medicine may be used for other purposes; ask your health care provider or pharmacist if you have questions. COMMON BRAND NAME(S): Pataday, Patanol, Pazeo What should I tell my health care provider before I take this medicine? They need to know if you have any of these conditions:  wear contact lenses  an unusual or allergic reaction to olopatadine, other medicines, foods, dyes, or preservatives  pregnant or trying to get pregnant  breast-feeding How should I use this medicine? This medicine is only for use in the eye. Do not take by mouth. Follow the directions on the prescription label. Wash hands before and after use. Tilt the head back slightly and pull down the lower lid with the index finger to form a pouch. Try not to touch the tip of the dropper to your eye, fingertips, or any other surface. Place the prescribed number of drops into the pouch. Close the eye gently to spread the drops. Your vision may blur for a few minutes. Use your doses at regular intervals. Do not use your medicine more often than directed. Talk to your pediatrician regarding the use of this medicine in children. While this drug may be prescribed for children as young as 10 years of age for selected conditions, precautions do apply. Overdosage: If you think you have taken too much of this medicine contact a poison control center or emergency room at once. NOTE: This medicine is only for you. Do not share this medicine with others. What if I miss a dose? If you miss a dose, use it as soon as you can. If it is almost time for your next dose, use only that dose. Do not use double or extra doses. What may interact with this medicine? Interactions are not expected. Do not use any other eye products without telling your doctor or health care professional. This list may not  describe all possible interactions. Give your health care provider a list of all the medicines, herbs, non-prescription drugs, or dietary supplements you use. Also tell them if you smoke, drink alcohol, or use illegal drugs. Some items may interact with your medicine. What should I watch for while using this medicine? Tell your doctor or healthcare professional if your symptoms do not start to get better or if they get worse. Stop using this medicine if your eyes get swollen, painful, or have a discharge, and see your doctor or health care professional as soon as you can. Do not wear contact lenses if your eyes are red. This medicine should not be used to treat irritation that is caused by contact lenses. Remove soft contact lenses before using this medicine. Contact lenses may be inserted 10 minutes after putting the medicine in the eye. What side effects may I notice from receiving this medicine? Side effects that you should report to your doctor or health care professional as soon as possible:  allergic reactions like skin rash, itching or hives, swelling of the face, lips, or tongue  eye pain, swelling, or increased redness Side effects that usually do not require medical attention (report to your doctor or health care professional if they continue or are bothersome):  burning, discomfort, stinging, or tearing immediately after use  dry eyes  headache  runny or stuffy nose This list may not describe all possible side effects. Call your doctor for medical advice about  side effects. You may report side effects to FDA at 1-800-FDA-1088. Where should I keep my medicine? Keep out of reach of children. Store between 4 and 25 degrees C (39 and 77 degrees F). Keep container tightly closed when not in use. Protect from light. Throw away any unused medicine after the expiration date. NOTE: This sheet is a summary. It may not cover all possible information. If you have questions about this medicine,  talk to your doctor, pharmacist, or health care provider.  2021 Elsevier/Gold Standard (2016-05-08 15:45:49) Oxymetazoline nasal spray What is this medicine? Oxymetazoline (OX ee me TAZ oh leen) is a nasal decongestant. This medicine is used to treat nasal congestion or a stuffy nose. This medicine will not treat an infection. This medicine may be used for other purposes; ask your health care provider or pharmacist if you have questions. COMMON BRAND NAME(S): 12 Hour Nasal, Afrin, Afrin Extra Moisturizing, Afrin Nasal Sinus, Afrin No Drip Severe Congestion, Dristan, Duration, Genasal, Mucinex Children's Stuffy Nose, Mucinex Full Force, Mucinex Moisture Smart, Mucinex Sinus-Max, Mucinex Sinus-Max Sinus & Allergy, NASAL Decongestant, Nasal Relief, Neo-Synephrine 12-Hour, Neo-Synephrine Severe Sinus Congestion, Nostrilla Fast Relief, Sinex 12-Hour, Sudafed OM Sinus Cold Moisturizing, Sudafed OM Sinus Congestion Moisturizing, Vicks Qlearquil Decongestant, Vicks Sinex, Vicks Sinex Severe, Vicks Sinus Daytime, Zicam Extreme Congestion Relief, Zicam Intense Sinus What should I tell my health care provider before I take this medicine? They need to know if you have any of these conditions:  diabetes  glaucoma  heart disease  high or low blood pressure  history of stroke  Raynaud's phenomenon  scleroderma  Sjogren's syndrome  thromboangiitis obliterans  thyroid disease  trouble urinating due to an enlarged prostate gland  an unusual or allergic reaction to oxymetazoline, other medicines, foods, dyes, or preservatives  pregnant or trying to get pregnant  breast-feeding How should I use this medicine? This medicine is for use in the nose. Do not take by mouth. Follow the directions on the package label. Shake well before using. Use your medicine at regular intervals or as directed by your health care provider. Do not use it more often than directed. Do not use for more than 3 days in a row  without advice. Make sure that you are using your nasal spray correctly. Ask your doctor or health care provider if you have any questions. Talk to your pediatrician regarding the use of this medicine in children. While this drug may be prescribed for children as young as 6 years for selected conditions, precautions do apply. Overdosage: If you think you have taken too much of this medicine contact a poison control center or emergency room at once. NOTE: This medicine is only for you. Do not share this medicine with others. What if I miss a dose? If you miss a dose, use it as soon as you can. If it is almost time for your next dose, use only that dose. Do not use double or extra doses. What may interact with this medicine? The medicine may interaction with the following medications:  MAOIs like isocarboxazid, phenelzine, rasagiline, selegiline, and tranylcypromine  medicines to treat blood pressure and heart disease like ace-inhibitors, beta-blockers, calcium-channel blockers, digoxin, and diuretics  medicines to treat enlarged prostate like alfuzosin, doxazosin, prazosin, and terazosin  nafarelin This list may not describe all possible interactions. Give your health care provider a list of all the medicines, herbs, non-prescription drugs, or dietary supplements you use. Also tell them if you smoke, drink alcohol, or use illegal  drugs. Some items may interact with your medicine. What should I watch for while using this medicine? Tell your doctor or health care professional if your symptoms do not start to get better or if they get worse. To prevent the spread of infection, do not share bottle with anyone else. What side effects may I notice from receiving this medicine? Side effects that you should report to your doctor or health care professional as soon as possible:  allergic reactions like skin rash, itching or hives, swelling of the face, lips, or tongue Side effects that usually do not  require medical attention (report to your doctor or health care professional if they continue or are bothersome):  burning, stinging, or irritation in the nose right after use  increased nasal discharge  sneezing This list may not describe all possible side effects. Call your doctor for medical advice about side effects. You may report side effects to FDA at 1-800-FDA-1088. Where should I keep my medicine? Keep out of the reach of children. Store at room temperature between 20 and 25 degrees C (68 and 77 degrees F). Throw away any unused medicine after the expiration date. NOTE: This sheet is a summary. It may not cover all possible information. If you have questions about this medicine, talk to your doctor, pharmacist, or health care provider.  2021 Elsevier/Gold Standard (2015-11-02 13:48:04)

## 2021-01-18 NOTE — Progress Notes (Signed)
[name], dettmer are scheduled for a virtual visit with your provider today.    Just as we do with appointments in the office, we must obtain your consent to participate.  Your consent will be active for this visit and any virtual visit you may have with one of our providers in the next 365 days.    If you have a MyChart account, I can also send a copy of this consent to you electronically.  All virtual visits are billed to your insurance company just like a traditional visit in the office.  As this is a virtual visit, video technology does not allow for your provider to perform a traditional examination.  This may limit your provider's ability to fully assess your condition.  If your provider identifies any concerns that need to be evaluated in person or the need to arrange testing such as labs, EKG, etc, we will make arrangements to do so.    Although advances in technology are sophisticated, we cannot ensure that it will always work on either your end or our end.  If the connection with a video visit is poor, we may have to switch to a telephone visit.  With either a video or telephone visit, we are not always able to ensure that we have a secure connection.   I need to obtain your verbal consent now.   Are you willing to proceed with your visit today?   Bethel Sirois has provided verbal consent on 01/18/2021 for a virtual visit (video or telephone).   Margaretann Loveless, PA-C 01/18/2021  8:57 AM    MyChart Video Visit    Virtual Visit via Video Note   This visit type was conducted due to national recommendations for restrictions regarding the COVID-19 Pandemic (e.g. social distancing) in an effort to limit this patient's exposure and mitigate transmission in our community. This patient is at least at moderate risk for complications without adequate follow up. This format is felt to be most appropriate for this patient at this time. Physical exam was limited by quality of the video and  audio technology used for the visit.   Patient location: Work isolated away from others to [employer] location: Home office in [place]  I discussed the limitations of evaluation and management by telemedicine and the availability of in person appointments. The patient expressed understanding and agreed to proceed.  Patient: Loretta Jensen   DOB: 18-Aug-1986   35 y.o. Female  MRN: [id number] Visit Date: 01/18/2021  Today's healthcare provider: Margaretann Loveless, PA-C   No chief complaint on file.  Subjective    Sinusitis This is a new problem. The current episode started yesterday. The problem is unchanged. There has been no fever. Associated symptoms include congestion, coughing, ear pain and sinus pressure. Pertinent negatives include no headaches or shortness of breath. Treatments tried: Flonase, claritin, xyzal, singulair. The treatment provided no relief.  Patient has chronic, severe allergies and is taking all medications as prescribed. After it rained, she reports her symptoms have progressed. Having severe nasal congestion and significant mucous production.   Patient Active Problem List   Diagnosis Date Noted  . COVID 10/05/2020  . Reactive airway disease 03/17/2020  . Chronic midline low back pain without sciatica 03/17/2020  . Family history of breast cancer   . Seasonal allergic rhinitis 01/19/2019  . Family history of breast cancer in mother 01/19/2019  . Menorrhagia with irregular cycle 01/19/2019  . Nausea and vomiting during pregnancy 04/24/2018  .  BMI 34.0-34.9,adult 03/04/2018  . Migraine headache 01/23/2018  . Occupational exposure to dust 05/23/2015   Past Medical History:  Diagnosis Date  . Allergy   . Family history of breast cancer   . Headache   . History of cesarean delivery 12/04/2016  . Obesity affecting pregnancy 03/04/2018  . Rh negative state in antepartum period 07/09/2018  . Short interval between pregnancies affecting pregnancy,  antepartum 01/19/2018  . Status post cesarean delivery 03/02/2017  . Type O blood, Rh negative       Medications: Outpatient Medications Prior to Visit  Medication Sig  . albuterol (VENTOLIN HFA) 108 (90 Base) MCG/ACT inhaler INHALE 2 PUFFS INTO THE LUNGS EVERY 6 HOURS AS NEEDED FOR WHEEZING OR SHORTNESS OF BREATH.  Would use routinely 2-3 times daily until present symptoms improve, then return to as needed use  . fluticasone (FLONASE) 50 MCG/ACT nasal spray Place 2 sprays into both nostrils daily.  . Fluticasone-Umeclidin-Vilant (TRELEGY ELLIPTA) 100-62.5-25 MCG/INH AEPB Inhale 1 puff into the lungs daily.  Marland Kitchen ibuprofen (ADVIL) 600 MG tablet TAKE 1 TABLET BY MOUTH EVERY 8 HOURS AS NEEDED FOR HEADACHE, MILD PAIN, MODERATE PAIN OR CRAMPING.  Marland Kitchen levocetirizine (XYZAL) 5 MG tablet TAKE 1 TABLET(5 MG) BY MOUTH EVERY EVENING  . loratadine (CLARITIN) 10 MG tablet Take 1 tablet (10 mg total) by mouth every morning.  . montelukast (SINGULAIR) 10 MG tablet Take 1 tablet (10 mg total) by mouth at bedtime.   No facility-administered medications prior to visit.    Review of Systems  Constitutional: Negative for fever.  HENT: Positive for congestion, ear pain, postnasal drip, rhinorrhea and sinus pressure.   Respiratory: Positive for cough. Negative for chest tightness, shortness of breath and wheezing.   Cardiovascular: Negative for chest pain.  Gastrointestinal: Negative for abdominal pain.  Neurological: Negative for dizziness and headaches.    Last CBC Lab Results  Component Value Date   WBC 5.6 04/23/2019   HGB 11.7 04/23/2019   HCT 36.2 04/23/2019   MCV 79.4 (L) 04/23/2019   MCH 25.7 (L) 04/23/2019   RDW 13.8 04/23/2019   PLT 366 04/23/2019   Last metabolic panel Lab Results  Component Value Date   GLUCOSE 108 (H) 04/23/2019   NA 138 04/23/2019   K 4.2 04/23/2019   CL 107 04/23/2019   CO2 24 04/23/2019   BUN 11 04/23/2019   CREATININE 0.67 04/23/2019   GFRNONAA 117 04/23/2019    GFRAA 136 04/23/2019   CALCIUM 8.7 04/23/2019   PROT 6.6 04/23/2019   ALBUMIN 3.9 06/30/2015   LABGLOB 2.8 06/30/2015   AGRATIO 1.4 06/30/2015   BILITOT 0.2 04/23/2019   ALKPHOS 60 06/30/2015   AST 14 04/23/2019   ALT 17 04/23/2019      Objective    There were no vitals taken for this visit. BP Readings from Last 3 Encounters:  12/22/20 112/84  10/30/20 120/70  03/17/20 134/88   Wt Readings from Last 3 Encounters:  12/22/20 205 lb 1.6 oz (93 kg)  10/30/20 206 lb 4.8 oz (93.6 kg)  03/17/20 205 lb 14.4 oz (93.4 kg)      Physical Exam Vitals reviewed.  Constitutional:      General: She is not in acute distress.    Appearance: Normal appearance. She is well-developed. She is not ill-appearing.  HENT:     Head: Normocephalic and atraumatic.  Pulmonary:     Effort: Pulmonary effort is normal. No respiratory distress (no difficulty speaking in complete sentences).  Musculoskeletal:  Cervical back: Normal range of motion and neck supple.  Neurological:     Mental Status: She is alert.  Psychiatric:        Behavior: Behavior normal.        Thought Content: Thought content normal.        Judgment: Judgment normal.        Assessment & Plan     1. Environmental and seasonal allergies - Suspect allergy exacerbation as symptoms only present x 1 day - Add Afrin and pataday eye drops as below - Advised to not use Afrin more than 3 days consecutively to avoid rebound congestion - Patient has upcoming appt in 2 weeks with Allergy/Asthma Specialist - Discussed considering adding astelin nasal spray if symptoms continue - Seek in person evaluation if symptoms progress or worsen - oxymetazoline (AFRIN) 0.05 % nasal spray; Place 1 spray into both nostrils 2 (two) times daily. Do not use for more than 3 days consecutive  Dispense: 30 mL; Refill: 0 - Olopatadine HCl 0.2 % SOLN; Use 1 drop in each eye BID  Dispense: 2.5 mL; Refill: 0   No follow-ups on file.     I  discussed the assessment and treatment plan with the patient. The patient was provided an opportunity to ask questions and all were answered. The patient agreed with the plan and demonstrated an understanding of the instructions.   The patient was advised to call back or seek an in-person evaluation if the symptoms worsen or if the condition fails to improve as anticipated.  I provided 13 minutes of face-to-face time during this encounter via MyChart Video enabled encounter.   Reine Just [facility] [phone] (phone) [phone] (fax)  [facility]

## 2021-01-22 ENCOUNTER — Other Ambulatory Visit: Payer: Self-pay | Admitting: Family Medicine

## 2021-01-22 DIAGNOSIS — Z1231 Encounter for screening mammogram for malignant neoplasm of breast: Secondary | ICD-10-CM

## 2021-01-26 ENCOUNTER — Ambulatory Visit
Admission: RE | Admit: 2021-01-26 | Discharge: 2021-01-26 | Disposition: A | Discharge status: Home / Self Care | Payer: 59 | Source: Ambulatory Visit | Attending: Allergy | Admitting: Allergy

## 2021-01-26 ENCOUNTER — Encounter (HOSPITAL_COMMUNITY): Payer: 59

## 2021-01-26 ENCOUNTER — Other Ambulatory Visit: Payer: Self-pay | Admitting: Allergy

## 2021-01-26 ENCOUNTER — Other Ambulatory Visit: Payer: Self-pay

## 2021-01-26 ENCOUNTER — Ambulatory Visit
Admission: RE | Admit: 2021-01-26 | Discharge: 2021-01-26 | Disposition: A | Discharge status: Home / Self Care | Payer: 59 | Source: Ambulatory Visit | Attending: Family Medicine | Admitting: Family Medicine

## 2021-01-26 ENCOUNTER — Ambulatory Visit
Admission: RE | Admit: 2021-01-26 | Discharge: 2021-01-26 | Disposition: A | Discharge status: Home / Self Care | Payer: 59 | Attending: Allergy | Admitting: Allergy

## 2021-01-26 DIAGNOSIS — J4530 Mild persistent asthma, uncomplicated: Secondary | ICD-10-CM | POA: Diagnosis present

## 2021-01-26 DIAGNOSIS — Z1231 Encounter for screening mammogram for malignant neoplasm of breast: Secondary | ICD-10-CM | POA: Insufficient documentation

## 2021-02-12 ENCOUNTER — Other Ambulatory Visit: Payer: Self-pay | Admitting: Emergency Medicine

## 2021-02-12 DIAGNOSIS — G8929 Other chronic pain: Secondary | ICD-10-CM

## 2021-02-12 DIAGNOSIS — M545 Low back pain, unspecified: Secondary | ICD-10-CM

## 2021-02-13 MED ORDER — IBUPROFEN 600 MG PO TABS: ORAL_TABLET | ORAL | 1 refills | Status: DC

## 2021-04-27 ENCOUNTER — Other Ambulatory Visit: Payer: Self-pay

## 2021-04-27 DIAGNOSIS — R252 Cramp and spasm: Secondary | ICD-10-CM | POA: Diagnosis not present

## 2021-04-27 DIAGNOSIS — R1110 Vomiting, unspecified: Secondary | ICD-10-CM | POA: Diagnosis not present

## 2021-04-27 DIAGNOSIS — X30XXXA Exposure to excessive natural heat, initial encounter: Secondary | ICD-10-CM | POA: Diagnosis not present

## 2021-04-27 DIAGNOSIS — R42 Dizziness and giddiness: Secondary | ICD-10-CM | POA: Insufficient documentation

## 2021-04-27 DIAGNOSIS — Z8616 Personal history of COVID-19: Secondary | ICD-10-CM | POA: Insufficient documentation

## 2021-04-27 LAB — CBC
HCT: 33.2 % — ABNORMAL LOW (ref 36.0–46.0)
Hemoglobin: 10.7 g/dL — ABNORMAL LOW (ref 12.0–15.0)
MCH: 24.2 pg — ABNORMAL LOW (ref 26.0–34.0)
MCHC: 32.2 g/dL (ref 30.0–36.0)
MCV: 75.1 fL — ABNORMAL LOW (ref 80.0–100.0)
Platelets: 396 10*3/uL (ref 150–400)
RBC: 4.42 MIL/uL (ref 3.87–5.11)
RDW: 16 % — ABNORMAL HIGH (ref 11.5–15.5)
WBC: 12.5 10*3/uL — ABNORMAL HIGH (ref 4.0–10.5)
nRBC: 0 % (ref 0.0–0.2)

## 2021-04-27 LAB — COMPREHENSIVE METABOLIC PANEL
ALT: 27 U/L (ref 0–44)
AST: 20 U/L (ref 15–41)
Albumin: 3.9 g/dL (ref 3.5–5.0)
Alkaline Phosphatase: 59 U/L (ref 38–126)
Anion gap: 5 (ref 5–15)
BUN: 9 mg/dL (ref 6–20)
CO2: 24 mmol/L (ref 22–32)
Calcium: 8.6 mg/dL — ABNORMAL LOW (ref 8.9–10.3)
Chloride: 109 mmol/L (ref 98–111)
Creatinine, Ser: 0.77 mg/dL (ref 0.44–1.00)
GFR, Estimated: >60 mL/min (ref >60)
Glucose, Bld: 100 mg/dL — ABNORMAL HIGH (ref 70–99)
Potassium: 3.6 mmol/L (ref 3.5–5.1)
Sodium: 138 mmol/L (ref 135–145)
Total Bilirubin: 0.6 mg/dL (ref 0.3–1.2)
Total Protein: 7.9 g/dL (ref 6.5–8.1)

## 2021-04-27 LAB — TROPONIN I (HIGH SENSITIVITY): Troponin I (High Sensitivity): <2 ng/L (ref <18)

## 2021-04-27 LAB — CK: Total CK: 82 U/L (ref 38–234)

## 2021-04-27 NOTE — ED Triage Notes (Signed)
Pt states she became overheated today at work around 1300. Pt states since overheating she has been dizzy, lightheaded and has had vomiting. Pt complains of muscle cramping. Pt with moist oral mucus membranes and appears in no acute distress.

## 2021-04-28 ENCOUNTER — Emergency Department
Admission: EM | Admit: 2021-04-28 | Discharge: 2021-04-28 | Disposition: A | Discharge status: Home / Self Care | Payer: 59 | Attending: Emergency Medicine | Admitting: Emergency Medicine

## 2021-04-28 DIAGNOSIS — T679XXA Effect of heat and light, unspecified, initial encounter: Secondary | ICD-10-CM

## 2021-04-28 MED ORDER — ONDANSETRON HCL 4 MG/2ML IJ SOLN
4.0000 mg | Freq: Once | INTRAMUSCULAR | Status: AC
  Administered 2021-04-28: 4 mg via INTRAVENOUS
  Filled 2021-04-28: qty 2

## 2021-04-28 MED ORDER — KETOROLAC TROMETHAMINE 30 MG/ML IJ SOLN
15.0000 mg | Freq: Once | INTRAMUSCULAR | Status: AC
  Administered 2021-04-28: 15 mg via INTRAVENOUS
  Filled 2021-04-28: qty 1

## 2021-04-28 MED ORDER — SODIUM CHLORIDE 0.9 % IV BOLUS
1000.0000 mL | Freq: Once | INTRAVENOUS | Status: AC
  Administered 2021-04-28: 1000 mL via INTRAVENOUS

## 2021-04-28 NOTE — ED Notes (Signed)
Patient verbalizes understanding of discharge instructions. Opportunity for questioning and answers were provided. Armband removed by staff, pt discharged from ED. Ambulated out to lobby  

## 2021-04-28 NOTE — Discharge Instructions (Addendum)
Try to stay indoors in the air conditioning and drink plenty of fluids daily.  Return to the ER for worsening symptoms, persistent vomiting, difficulty breathing or other concerns.

## 2021-04-28 NOTE — ED Provider Notes (Signed)
[facility] Emergency Department Provider Note   ____________________________________________   Event Date/Time   First MD Initiated Contact with Patient 04/28/21 0201     (approximate)  I have reviewed the triage vital signs and the nursing notes.   HISTORY  Chief Complaint Vomiting    HPI Loretta Jensen is a 35 y.o. female who presents to the ED from work with a chief complaint of heat related illness.  Patient works at a [employer] and states the temperature reached 100 F today.  She became overheated and felt dizzy, lightheaded, had muscle cramping and some vomiting.  Denies cough, chest pain, shortness of breath, abdominal pain, diarrhea.     Past Medical History:  Diagnosis Date   Allergy    Family history of breast cancer    Headache    History of cesarean delivery 12/04/2016   Obesity affecting pregnancy 03/04/2018   Rh negative state in antepartum period 07/09/2018   Short interval between pregnancies affecting pregnancy, antepartum 01/19/2018   Status post cesarean delivery 03/02/2017   Type O blood, Rh negative     Patient Active Problem List   Diagnosis Date Noted   COVID 10/05/2020   Reactive airway disease 03/17/2020   Chronic midline low back pain without sciatica 03/17/2020   Family history of breast cancer    Seasonal allergic rhinitis 01/19/2019   Family history of breast cancer in mother 01/19/2019   Menorrhagia with irregular cycle 01/19/2019   Nausea and vomiting during pregnancy 04/24/2018   BMI 34.0-34.9,adult 03/04/2018   Migraine headache 01/23/2018   Occupational exposure to dust 05/23/2015    Past Surgical History:  Procedure Laterality Date   CESAREAN SECTION  2009   CESAREAN SECTION N/A 03/02/2017   Procedure: CESAREAN SECTION;  Surgeon: Will Bonnet, MD;  Location: [facility] ORS;  Service: Obstetrics;  Laterality: N/A;   CESAREAN SECTION N/A 07/10/2018   Procedure: CESAREAN SECTION;  Surgeon: Will Bonnet, MD;  Location: [facility] ORS;  Service: Obstetrics;  Laterality: N/A;   TUBAL LIGATION Bilateral 07/10/2018   Procedure: BILATERAL TUBAL LIGATION;  Surgeon: Will Bonnet, MD;  Location: [facility] ORS;  Service: Obstetrics;  Laterality: Bilateral;    Prior to Admission medications   Medication Sig Start Date End Date Taking? Authorizing Provider  albuterol (VENTOLIN HFA) 108 (90 Base) MCG/ACT inhaler INHALE 2 PUFFS INTO THE LUNGS EVERY 6 HOURS AS NEEDED FOR WHEEZING OR SHORTNESS OF BREATH.  Would use routinely 2-3 times daily until present symptoms improve, then return to as needed use 12/05/20   Delsa Grana, PA-C  fluticasone (FLONASE) 50 MCG/ACT nasal spray Place 2 sprays into both nostrils daily. 01/19/19   Hubbard Hartshorn, FNP  Fluticasone-Umeclidin-Vilant (TRELEGY ELLIPTA) 100-62.5-25 MCG/INH AEPB Inhale 1 puff into the lungs daily. 12/22/20   Trinna Post, PA-C  ibuprofen (ADVIL) 600 MG tablet TAKE 1 TABLET BY MOUTH EVERY 8 HOURS AS NEEDED FOR HEADACHE, MILD PAIN, MODERATE PAIN OR CRAMPING. 02/13/21   Delsa Grana, PA-C  levocetirizine (XYZAL) 5 MG tablet TAKE 1 TABLET(5 MG) BY MOUTH EVERY EVENING 10/30/20   Delsa Grana, PA-C  loratadine (CLARITIN) 10 MG tablet Take 1 tablet (10 mg total) by mouth every morning. 10/30/20   Delsa Grana, PA-C  montelukast (SINGULAIR) 10 MG tablet Take 1 tablet (10 mg total) by mouth at bedtime. 10/30/20   Delsa Grana, PA-C  Olopatadine HCl 0.2 % SOLN Use 1 drop in each eye BID 01/18/21   Mar Daring, PA-C  oxymetazoline (  AFRIN) 0.05 % nasal spray Place 1 spray into both nostrils 2 (two) times daily. Do not use for more than 3 days consecutive 01/18/21   Mar Daring, PA-C    Allergies Patient has no known allergies.  Family History  Problem Relation Age of Onset   Cancer Mother    Breast cancer Mother 60   Cancer Maternal Grandfather        2018 - liver   Breast cancer Other 75       2018   Breast cancer Other 55       2013    Breast cancer Other        1990s   Cancer Paternal Grandmother        unk type   Breast cancer Other     Social History Social History   Tobacco Use   Smoking status: Never   Smokeless tobacco: Never  Vaping Use   Vaping Use: Never used  Substance Use Topics   Alcohol use: Yes    Alcohol/week: 0.0 standard drinks    Comment: occasionally   Drug use: No    Review of Systems  Constitutional: Positive for feeling hot and muscle cramps.  No fever/chills Eyes: No visual changes. ENT: No sore throat. Cardiovascular: Denies chest pain. Respiratory: Denies shortness of breath. Gastrointestinal: No abdominal pain.  Positive for vomiting.  No diarrhea.  No constipation. Genitourinary: Negative for dysuria. Musculoskeletal: Negative for back pain. Skin: Negative for rash. Neurological: Positive for dizziness.  Negative for headaches, focal weakness or numbness.   ____________________________________________   PHYSICAL EXAM:  VITAL SIGNS: ED Triage Vitals  Enc Vitals Group     BP 04/27/21 2223 (!) 147/77     Pulse Rate 04/27/21 2223 68     Resp 04/27/21 2223 16     Temp 04/27/21 2223 98.7 F (37.1 C)     Temp Source 04/27/21 2223 Oral     SpO2 04/27/21 2223 100 %     Weight 04/27/21 2224 203 lb (92.1 kg)     Height 04/27/21 2224 5' 4"  (1.626 m)     Head Circumference --      Peak Flow --      Pain Score 04/27/21 2223 8     Pain Loc --      Pain Edu? --      Excl. in [place]? --     Constitutional: Alert and oriented. Well appearing and in no acute distress. Eyes: Conjunctivae are normal. PERRL. EOMI. Head: Atraumatic. Nose: No congestion/rhinnorhea. Mouth/Throat: Mucous membranes are moist.   Neck: No stridor.   Cardiovascular: Normal rate, regular rhythm. Grossly normal heart sounds.  Good peripheral circulation. Respiratory: Normal respiratory effort.  No retractions. Lungs CTAB. Gastrointestinal: Soft and nontender to light or deep palpation. No distention. No  abdominal bruits. No CVA tenderness. Musculoskeletal: No lower extremity tenderness nor edema.  No joint effusions. Neurologic: Alert and oriented x3.  CN II-XII grossly intact. Normal speech and language. No gross focal neurologic deficits are appreciated. No gait instability. Skin:  Skin is warm, dry and intact. No rash noted. Psychiatric: Mood and affect are normal. Speech and behavior are normal.  ____________________________________________   LABS (all labs ordered are listed, but only abnormal results are displayed)  Labs Reviewed  CBC - Abnormal; Notable for the following components:      Result Value   WBC 12.5 (*)    Hemoglobin 10.7 (*)    HCT 33.2 (*)    MCV 75.1 (*)  MCH 24.2 (*)    RDW 16.0 (*)    All other components within normal limits  COMPREHENSIVE METABOLIC PANEL - Abnormal; Notable for the following components:   Glucose, Bld 100 (*)    Calcium 8.6 (*)    All other components within normal limits  CK  POC URINE PREG, ED  TROPONIN I (HIGH SENSITIVITY)   ____________________________________________  EKG  ED ECG REPORT I, Yezenia Fredrick J, the attending physician, personally viewed and interpreted this ECG.   Date: 04/28/2021  EKG Time: 2238  Rate: 66  Rhythm: normal sinus rhythm  Axis: Normal  Intervals:none  ST&T Change: Nonspecific  ____________________________________________  RADIOLOGY I, Charles Andringa J, personally viewed and evaluated these images (plain radiographs) as part of my medical decision making, as well as reviewing the written report by the radiologist.  ED MD interpretation: None  Official radiology report(s): No results found.  ____________________________________________   PROCEDURES  Procedure(s) performed (including Critical Care):  Procedures   ____________________________________________   INITIAL IMPRESSION / ASSESSMENT AND PLAN / ED COURSE  As part of my medical decision making, I reviewed the following data  within the [place] notes reviewed and incorporated, Labs reviewed, EKG interpreted, Old chart reviewed, and Notes from prior ED visits     35 year old female presenting with a myriad of symptoms listed above.  Differential diagnosis includes but is not limited to heat related illness, nontraumatic rhabdomyolysis, ACS, dehydration, etc.  Laboratory results unremarkable.  Patient overall feeling better.  Will infuse IV fluids, IV Toradol for muscle aches, Zofran for nausea and reassess.  Clinical Course as of 04/28/21 0538  Sat Apr 28, 2021  0538 Patient feeling better after IV fluids and medications.  Strict return precautions given.  Patient verbalizes understanding and agrees with plan of care. [JS]    Clinical Course User Index [JS] Paulette Blanch, MD     ____________________________________________   FINAL CLINICAL IMPRESSION(S) / ED DIAGNOSES  Final diagnoses:  Heat exposure, initial encounter     ED Discharge Orders     None        Note:  This document was prepared using Dragon voice recognition software and may include unintentional dictation errors.    Paulette Blanch, MD 04/28/21 [phone]

## 2021-05-21 ENCOUNTER — Ambulatory Visit: Payer: 59 | Admitting: Family Medicine

## 2021-07-05 ENCOUNTER — Encounter: Payer: Self-pay | Admitting: Internal Medicine

## 2021-07-05 ENCOUNTER — Telehealth (INDEPENDENT_AMBULATORY_CARE_PROVIDER_SITE_OTHER): Payer: 59 | Admitting: Internal Medicine

## 2021-07-05 VITALS — Temp 99.0°F | Ht 64.0 in | Wt 203.0 lb

## 2021-07-05 DIAGNOSIS — B9789 Other viral agents as the cause of diseases classified elsewhere: Secondary | ICD-10-CM

## 2021-07-05 DIAGNOSIS — J028 Acute pharyngitis due to other specified organisms: Secondary | ICD-10-CM | POA: Diagnosis not present

## 2021-07-05 DIAGNOSIS — J069 Acute upper respiratory infection, unspecified: Secondary | ICD-10-CM

## 2021-07-05 NOTE — Progress Notes (Signed)
Virtual Visit via Video Note  I connected with [name] on 07/05/21 at 11:00 AM EDT by a video enabled telemedicine application and verified that I am speaking with the correct person using two identifiers.  Location: Patient: Loretta Jensen on side of road Provider: [facility]   I discussed the limitations of evaluation and management by telemedicine and the availability of in person appointments. The patient expressed understanding and agreed to proceed.  History of Present Illness: Loretta Jensen is presenting today for complaints of sore throat, cough and PND x 2 days. She does have a history of seasonal allergies, worse at the beginning of every season.  -Worst symptom: sore throat -Fever: no -Cough: yes, productive  -Shortness of breath: no -Wheezing: no -Chest pain: yes, with cough -Chest tightness: no -Chest congestion: no -Nasal congestion: no -Runny nose: yes -Post nasal drip: yes -Sneezing: yes -Sore throat: yes -Sinus pressure: no -Headache: yes -Ear pain: no  -Ear pressure: no  -Vomiting: no -Rash: no -Sick contacts: yes; kids at home with similar symptoms.  -Context: stable --Relief with OTC cold/cough medications: no  -Treatments attempted: cold/sinus, anti-histamine, and cough syrup. Taking Xyzal, Claritin, Singulair everyday for past 2 months. Also tried Nightquil, Lawyer for cough, which helps minimally.   Observations/Objective: General: well nourished, well developed, in no acute distress with non-toxic appearance HEENT: normocephalic, atraumatic, moist mucous membranes, PND present Neuro: Alert and oriented, speech normal  Assessment and Plan: 1. Viral upper respiratory tract infection/Sore throat (viral): Discussed etiology viral vs. Allergic. She will continue her anti-histamines and Singular. She is not currently using any nasal sprays, I do recommend a nasal steroids to alleviate postnasal drip symptoms. For cough she can continue tessalon  Per[place] or cough syrup as needed. A note for work was given today. Follow up if symptoms worsen or fail to improve.   Follow Up Instructions: PRN    I discussed the assessment and treatment plan with the patient. The patient was provided an opportunity to ask questions and all were answered. The patient agreed with the plan and demonstrated an understanding of the instructions.   The patient was advised to call back or seek an in-person evaluation if the symptoms worsen or if the condition fails to improve as anticipated.  I provided 10 minutes of non-face-to-face time during this encounter.   Margarita Mail, DO

## 2022-01-01 ENCOUNTER — Ambulatory Visit (INDEPENDENT_AMBULATORY_CARE_PROVIDER_SITE_OTHER): Payer: 59 | Admitting: Family Medicine

## 2022-01-01 ENCOUNTER — Encounter: Payer: Self-pay | Admitting: Family Medicine

## 2022-01-01 ENCOUNTER — Ambulatory Visit: Payer: 59 | Admitting: Family Medicine

## 2022-01-01 ENCOUNTER — Other Ambulatory Visit (HOSPITAL_COMMUNITY)
Admission: RE | Admit: 2022-01-01 | Discharge: 2022-01-01 | Disposition: A | Discharge status: Home / Self Care | Payer: 59 | Source: Ambulatory Visit | Attending: Family Medicine | Admitting: Family Medicine

## 2022-01-01 VITALS — BP 124/78 | HR 98 | Resp 16 | Ht 64.0 in | Wt 213.0 lb

## 2022-01-01 DIAGNOSIS — J4521 Mild intermittent asthma with (acute) exacerbation: Secondary | ICD-10-CM

## 2022-01-01 DIAGNOSIS — G8929 Other chronic pain: Secondary | ICD-10-CM

## 2022-01-01 DIAGNOSIS — Z6834 Body mass index (BMI) 34.0-34.9, adult: Secondary | ICD-10-CM

## 2022-01-01 DIAGNOSIS — Z572 Occupational exposure to dust: Secondary | ICD-10-CM

## 2022-01-01 DIAGNOSIS — Z0000 Encounter for general adult medical examination without abnormal findings: Secondary | ICD-10-CM | POA: Insufficient documentation

## 2022-01-01 DIAGNOSIS — N921 Excessive and frequent menstruation with irregular cycle: Secondary | ICD-10-CM

## 2022-01-01 DIAGNOSIS — M545 Low back pain, unspecified: Secondary | ICD-10-CM

## 2022-01-01 DIAGNOSIS — J302 Other seasonal allergic rhinitis: Secondary | ICD-10-CM

## 2022-01-01 DIAGNOSIS — Z803 Family history of malignant neoplasm of breast: Secondary | ICD-10-CM

## 2022-01-01 MED ORDER — ALBUTEROL SULFATE HFA 108 (90 BASE) MCG/ACT IN AERS: INHALATION_SPRAY | RESPIRATORY_TRACT | 1 refills | Status: DC

## 2022-01-01 MED ORDER — SYMBICORT 80-4.5 MCG/ACT IN AERO: 2.0000 | INHALATION_SPRAY | RESPIRATORY_TRACT | 4 refills | Status: DC

## 2022-01-01 MED ORDER — IBUPROFEN 600 MG PO TABS: ORAL_TABLET | ORAL | 1 refills | Status: DC

## 2022-01-01 NOTE — Assessment & Plan Note (Signed)
Doing well on current regimen, no changes made today. Encouraged follow up with allergist. ?

## 2022-01-01 NOTE — Progress Notes (Signed)
? ?BP 124/78   Pulse 98   Resp 16   Ht 5\' 4"  (1.626 m)   Wt 213 lb (96.6 kg)   SpO2 100%   BMI 36.56 kg/m?   ? ?Subjective:  ? ? Patient ID: Loretta Jensen, female    DOB: 01-21-1986, 36 y.o.   MRN: [id number] ? ?HPI: ?Loretta Jensen is a 36 y.o. female presenting on 01/01/2022 for comprehensive medical examination. Current medical complaints include:none ? ?RAD, seasonal allergic rhinitis - on symbicort, trelegy, singulair, albuterol prn. Previously referred to allergist, last seen 01/2021 with allergy testing, due for follow up with allergy. Using albuterol prn with bad environmental allergies or respiratory illnesses. Using symbicort every other day in the morning, singulair daily. Alternating xyzal and claritin every other day. On pataday drops as needed. Last used albuterol in March.  ? ?Family h/o breast cancer - 2 great aunts with breast cancer. Mom with 35yo, died at 35yo. Did not have genetic testing done. Last mammogram normal 01/2021. ? ?Dysmenorrhea - BTL 2019. Ibuprofen as needed. Lasts 5-6 days. LMP last week. No rashes, ulcers, abnl discharge.  ? ?She currently lives with: boyfriend, 3 kids ? ?Depression Screen done today and results listed below:  ? ?  01/01/2022  ? 12:54 PM 07/05/2021  ? 10:23 AM 12/22/2020  ? 12:58 PM 10/30/2020  ?  2:54 PM 10/05/2020  ?  7:42 AM  ?Depression screen PHQ 2/9  ?Decreased Interest 0 0 0 0 0  ?Down, Depressed, Hopeless 0 0 0 0 0  ?PHQ - 2 Score 0 0 0 0 0  ?Altered sleeping 0 0  0   ?Tired, decreased energy 0 0  0   ?Change in appetite 0 0  0   ?Feeling bad or failure about yourself  0 0  0   ?Trouble concentrating 0 0  0   ?Moving slowly or fidgety/restless 0 0  0   ?Suicidal thoughts 0 0  0   ?PHQ-9 Score 0 0  0   ?Difficult doing work/chores  Not difficult at all  Not difficult at all   ? ? ?Past Medical History:  ?Past Medical History:  ?Diagnosis Date  ? Allergy   ? COVID 10/05/2020  ? Family history of breast cancer   ? Headache   ? History of cesarean  delivery 12/04/2016  ? Obesity affecting pregnancy 03/04/2018  ? Rh negative state in antepartum period 07/09/2018  ? Short interval between pregnancies affecting pregnancy, antepartum 01/19/2018  ? Status post cesarean delivery 03/02/2017  ? Type O blood, Rh negative   ? ? ?Surgical History:  ?Past Surgical History:  ?Procedure Laterality Date  ? CESAREAN SECTION  2009  ? CESAREAN SECTION N/A 03/02/2017  ? Procedure: CESAREAN SECTION;  Surgeon: Conard NovakJackson, Stephen D, MD;  Location: [facility] ORS;  Service: Obstetrics;  Laterality: N/A;  ? CESAREAN SECTION N/A 07/10/2018  ? Procedure: CESAREAN SECTION;  Surgeon: Conard NovakJackson, Stephen D, MD;  Location: [facility] ORS;  Service: Obstetrics;  Laterality: N/A;  ? TUBAL LIGATION Bilateral 07/10/2018  ? Procedure: BILATERAL TUBAL LIGATION;  Surgeon: Conard NovakJackson, Stephen D, MD;  Location: [facility] ORS;  Service: Obstetrics;  Laterality: Bilateral;  ? ? ?Medications:  ?Current Outpatient Medications on File Prior to Visit  ?Medication Sig  ? fluticasone (FLONASE) 50 MCG/ACT nasal spray Place 2 sprays into both nostrils daily.  ? levocetirizine (XYZAL) 5 MG tablet TAKE 1 TABLET(5 MG) BY MOUTH EVERY EVENING  ? loratadine (CLARITIN) 10 MG tablet Take 1 tablet (10 mg total)  by mouth every morning.  ? montelukast (SINGULAIR) 10 MG tablet Take 1 tablet (10 mg total) by mouth at bedtime.  ? Olopatadine HCl 0.2 % SOLN Use 1 drop in each eye BID  ? ?No current facility-administered medications on file prior to visit.  ? ? ?Allergies:  ?No Known Allergies ? ?Social History:  ?Social History  ? ?Socioeconomic History  ? Marital status: Single  ?  Spouse name: Not on file  ? Number of children: 3  ? Years of education: 57  ? Highest education level: Not on file  ?Occupational History  ? Occupation: laminate and crush foam  ?  Comment: [employer]  ?Tobacco Use  ? Smoking status: Never  ? Smokeless tobacco: Never  ?Vaping Use  ? Vaping Use: Never used  ?Substance and Sexual Activity  ? Alcohol use: Yes  ?  Alcohol/week:  0.0 standard drinks  ?  Comment: occasionally  ? Drug use: No  ? Sexual activity: Yes  ?  Partners: Male  ?  Birth control/protection: Surgical  ?Other Topics Concern  ? Not on file  ?Social History Narrative  ? Not on file  ? ?Social Determinants of Health  ? ?Financial Resource Strain: Low Risk   ? Difficulty of Paying Living Expenses: Not hard at all  ?Food Insecurity: No Food Insecurity  ? Worried About Programme researcher, broadcasting/film/video in the Last Year: Never true  ? Ran Out of Food in the Last Year: Never true  ?Transportation Needs: No Transportation Needs  ? Lack of Transportation (Medical): No  ? Lack of Transportation (Non-Medical): No  ?Physical Activity: Sufficiently Active  ? Days of Exercise per Week: 5 days  ? Minutes of Exercise per Session: 60 min  ?Stress: No Stress Concern Present  ? Feeling of Stress : Only a little  ?Social Connections: Moderately Integrated  ? Frequency of Communication with Friends and Family: More than three times a week  ? Frequency of Social Gatherings with Friends and Family: Three times a week  ? Attends Religious Services: More than 4 times per year  ? Active Member of Clubs or Organizations: No  ? Attends Banker Meetings: Never  ? Marital Status: Living with partner  ?Intimate Partner Violence: Not At Risk  ? Fear of Current or Ex-Partner: No  ? Emotionally Abused: No  ? Physically Abused: No  ? Sexually Abused: No  ? ?Social History  ? ?Tobacco Use  ?Smoking Status Never  ?Smokeless Tobacco Never  ? ?Social History  ? ?Substance and Sexual Activity  ?Alcohol Use Yes  ? Alcohol/week: 0.0 standard drinks  ? Comment: occasionally  ? ? ?Family History:  ?Family History  ?Problem Relation Age of Onset  ? Cancer Mother   ? Breast cancer Mother 48  ? Cancer Maternal Grandfather   ?     2018 - liver  ? Breast cancer Other 75  ?     2018  ? Breast cancer Other 55  ?     2013  ? Breast cancer Other   ?     1990s  ? Cancer Paternal Grandmother   ?     unk type  ? Breast cancer  Other   ? ? ?Past medical history, surgical history, medications, allergies, family history and social history reviewed with patient today and changes made to appropriate areas of the chart.  ? ?   ?Objective:  ?  ?BP 124/78   Pulse 98   Resp 16   Ht  5\' 4"  (1.626 m)   Wt 213 lb (96.6 kg)   SpO2 100%   BMI 36.56 kg/m?   ?Wt Readings from Last 3 Encounters:  ?01/01/22 213 lb (96.6 kg)  ?07/05/21 203 lb (92.1 kg)  ?04/27/21 203 lb (92.1 kg)  ?  ?Physical Exam ?Vitals reviewed. Exam conducted with a chaperone present.  ?HENT:  ?   Head: Normocephalic.  ?   Right Ear: External ear normal.  ?   Left Ear: External ear normal.  ?   Nose: Nose normal.  ?Eyes:  ?   Extraocular Movements: Extraocular movements intact.  ?Cardiovascular:  ?   Rate and Rhythm: Normal rate and regular rhythm.  ?   Heart sounds: Normal heart sounds. No murmur heard. ?Pulmonary:  ?   Effort: Pulmonary effort is normal. No respiratory distress.  ?   Breath sounds: Normal breath sounds.  ?Abdominal:  ?   General: Bowel sounds are normal.  ?   Palpations: Abdomen is soft.  ?   Tenderness: There is no abdominal tenderness.  ?Genitourinary: ?   General: Normal vulva.  ?   Vagina: Normal.  ?   Cervix: Normal.  ?   Uterus: Normal.   ?   Adnexa: Right adnexa normal and left adnexa normal.  ?   Comments: Scant bleeding on speculum exam. ?Neurological:  ?   Mental Status: She is alert.  ? ?Results for orders placed or performed during the hospital encounter of 04/28/21  ?CBC  ?Result Value Ref Range  ? WBC 12.5 (H) 4.0 - 10.5 K/uL  ? RBC 4.42 3.87 - 5.11 MIL/uL  ? Hemoglobin 10.7 (L) 12.0 - 15.0 g/dL  ? HCT 33.2 (L) 36.0 - 46.0 %  ? MCV 75.1 (L) 80.0 - 100.0 fL  ? MCH 24.2 (L) 26.0 - 34.0 pg  ? MCHC 32.2 30.0 - 36.0 g/dL  ? RDW 16.0 (H) 11.5 - 15.5 %  ? Platelets 396 150 - 400 K/uL  ? nRBC 0.0 0.0 - 0.2 %  ?Comprehensive metabolic panel  ?Result Value Ref Range  ? Sodium 138 135 - 145 mmol/L  ? Potassium 3.6 3.5 - 5.1 mmol/L  ? Chloride 109 98 - 111  mmol/L  ? CO2 24 22 - 32 mmol/L  ? Glucose, Bld 100 (H) 70 - 99 mg/dL  ? BUN 9 6 - 20 mg/dL  ? Creatinine, Ser 0.77 0.44 - 1.00 mg/dL  ? Calcium 8.6 (L) 8.9 - 10.3 mg/dL  ? Total Protein 7.9 6.5 - 8.1 g/dL  ? Albumin 3.9 3

## 2022-01-01 NOTE — Assessment & Plan Note (Signed)
Doing well on current regimen, no changes made today. 

## 2022-01-02 ENCOUNTER — Encounter: Payer: Self-pay | Admitting: Family Medicine

## 2022-01-02 LAB — COMPREHENSIVE METABOLIC PANEL
AG Ratio: 1.3 (calc) (ref 1.0–2.5)
ALT: 16 U/L (ref 6–29)
AST: 13 U/L (ref 10–30)
Albumin: 3.9 g/dL (ref 3.6–5.1)
Alkaline phosphatase (APISO): 58 U/L (ref 31–125)
BUN: 10 mg/dL (ref 7–25)
CO2: 25 mmol/L (ref 20–32)
Calcium: 8.7 mg/dL (ref 8.6–10.2)
Chloride: 107 mmol/L (ref 98–110)
Creat: 0.69 mg/dL (ref 0.50–0.97)
Globulin: 3 g/dL (calc) (ref 1.9–3.7)
Glucose, Bld: 90 mg/dL (ref 65–99)
Potassium: 4.2 mmol/L (ref 3.5–5.3)
Sodium: 141 mmol/L (ref 135–146)
Total Bilirubin: 0.3 mg/dL (ref 0.2–1.2)
Total Protein: 6.9 g/dL (ref 6.1–8.1)

## 2022-01-02 LAB — LIPID PANEL
Cholesterol: 121 mg/dL (ref <200)
HDL: 53 mg/dL (ref >=50)
LDL Cholesterol (Calc): 54 mg/dL (calc)
Non-HDL Cholesterol (Calc): 68 mg/dL (calc) (ref <130)
Total CHOL/HDL Ratio: 2.3 (calc) (ref <5.0)
Triglycerides: 63 mg/dL (ref <150)

## 2022-01-02 LAB — CBC WITH DIFFERENTIAL/PLATELET
Absolute Monocytes: 470 cells/uL (ref 200–950)
Basophils Absolute: 41 cells/uL (ref 0–200)
Basophils Relative: 0.5 %
Eosinophils Absolute: 154 cells/uL (ref 15–500)
Eosinophils Relative: 1.9 %
HCT: 32.3 % — ABNORMAL LOW (ref 35.0–45.0)
Hemoglobin: 9.8 g/dL — ABNORMAL LOW (ref 11.7–15.5)
Lymphs Abs: 2122 cells/uL (ref 850–3900)
MCH: 21.7 pg — ABNORMAL LOW (ref 27.0–33.0)
MCHC: 30.3 g/dL — ABNORMAL LOW (ref 32.0–36.0)
MCV: 71.5 fL — ABNORMAL LOW (ref 80.0–100.0)
MPV: 9.2 fL (ref 7.5–12.5)
Monocytes Relative: 5.8 %
Neutro Abs: 5314 cells/uL (ref 1500–7800)
Neutrophils Relative %: 65.6 %
Platelets: 443 10*3/uL — ABNORMAL HIGH (ref 140–400)
RBC: 4.52 10*6/uL (ref 3.80–5.10)
RDW: 15.7 % — ABNORMAL HIGH (ref 11.0–15.0)
Total Lymphocyte: 26.2 %
WBC: 8.1 10*3/uL (ref 3.8–10.8)

## 2022-01-02 LAB — HEMOGLOBIN A1C
Hgb A1c MFr Bld: 5.8 % of total Hgb — ABNORMAL HIGH (ref <5.7)
Mean Plasma Glucose: 120 mg/dL
eAG (mmol/L): 6.6 mmol/L

## 2022-01-03 IMAGING — CR DG CHEST 2V
2 series · 2 of 2 positions shown · non-contrast
Comparison: 06/28/2015

CLINICAL DATA: 34-year-old female with a history of wheezing/asthma

EXAM:
CHEST - 2 VIEW

[chest pa]
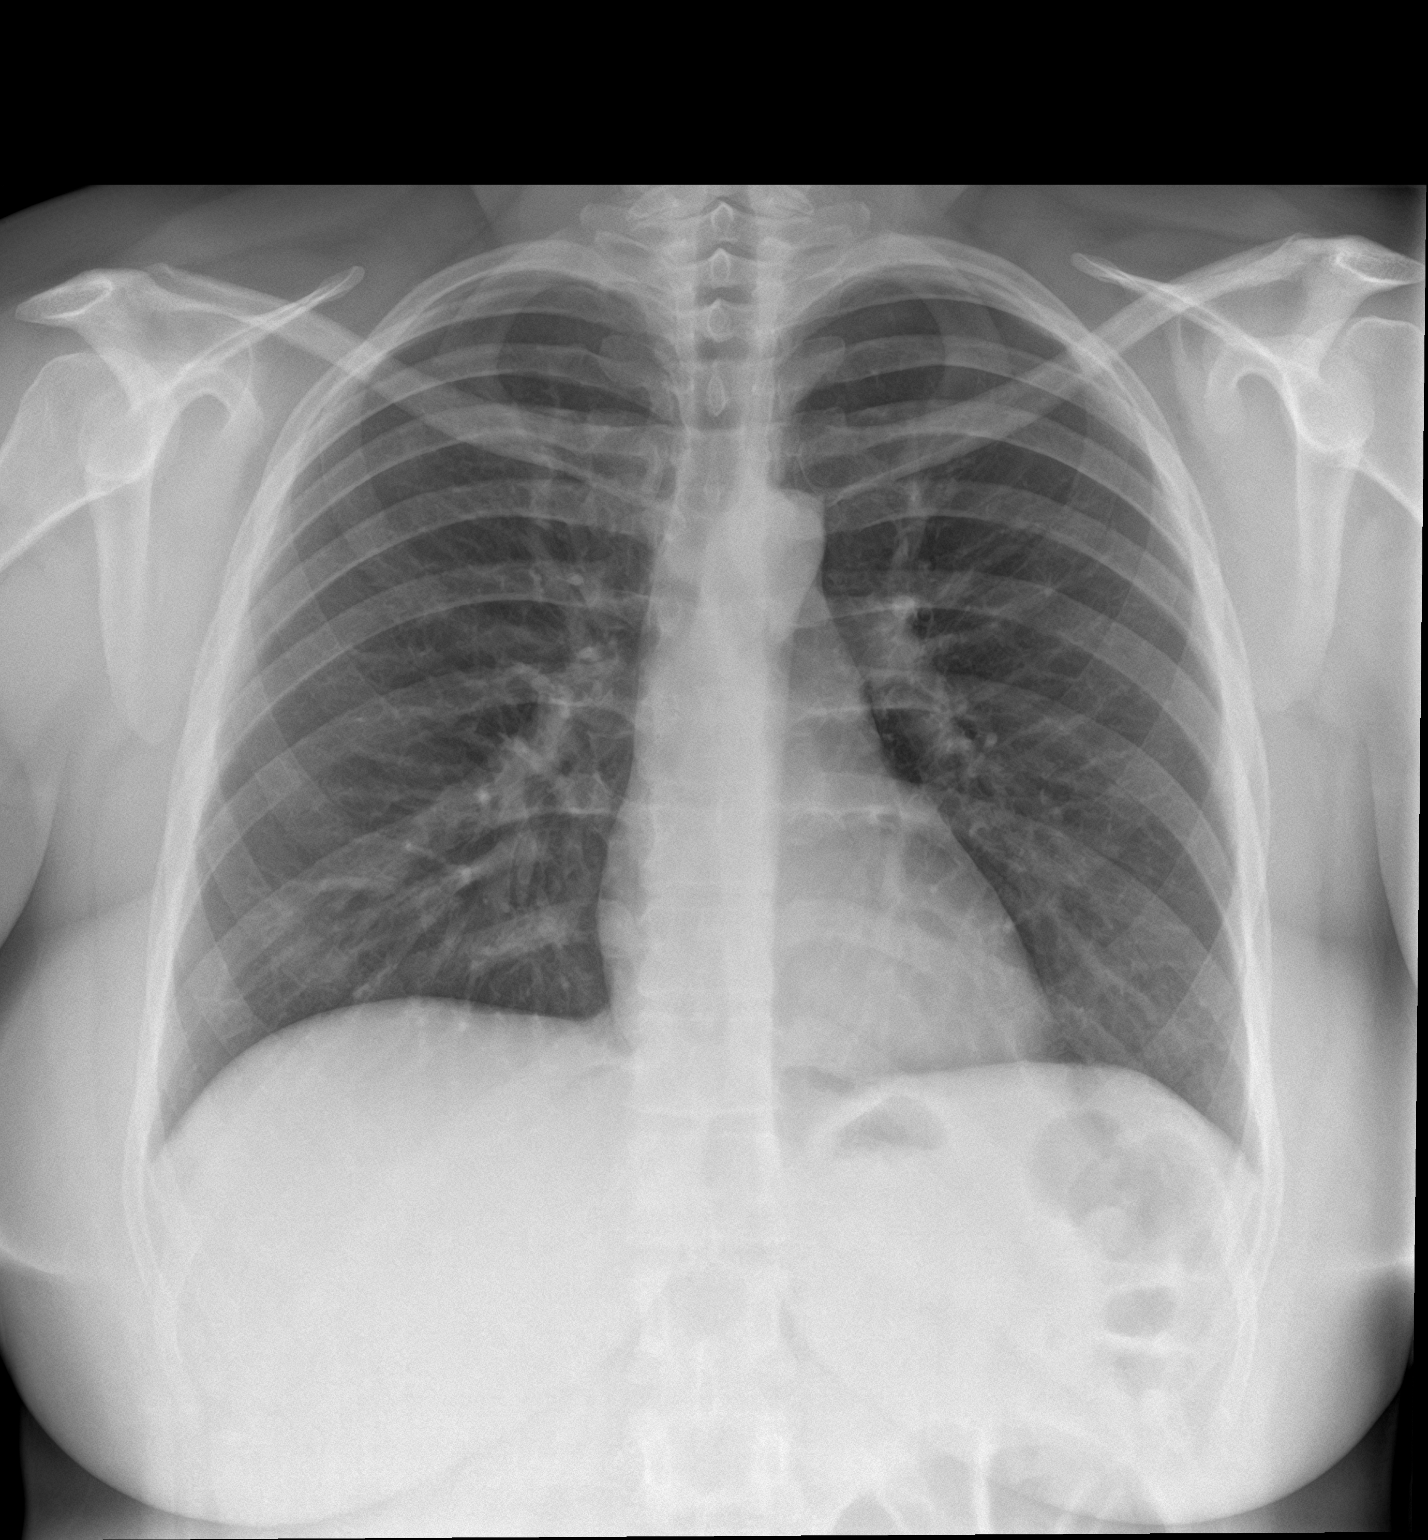

[chest lat]
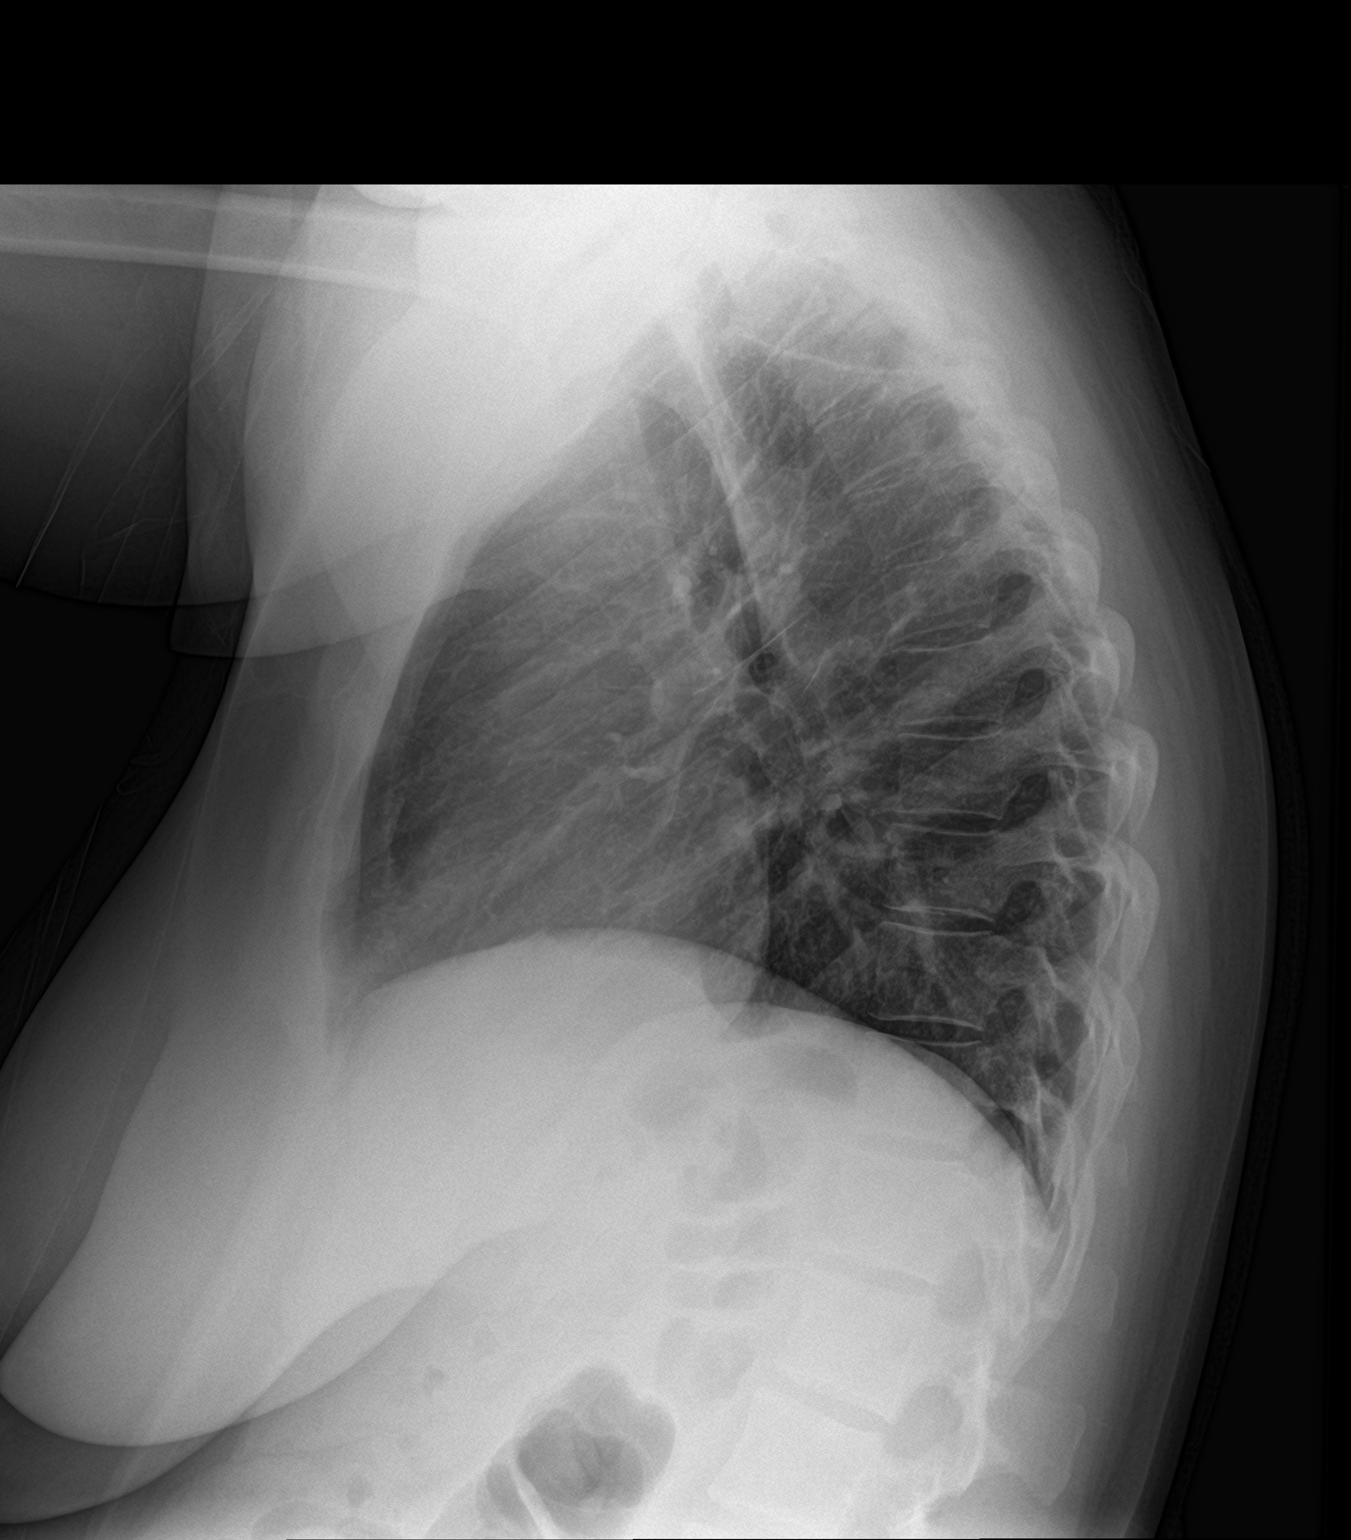

[2 of 2 positions shown; findings below may reference images not displayed]

FINDINGS: The heart size and mediastinal contours are within normal limits.
Both lungs are clear. The visualized skeletal structures are
unremarkable.
IMPRESSION: No active cardiopulmonary disease.

## 2022-01-04 LAB — CYTOLOGY - PAP
Chlamydia: NEGATIVE
Comment: NEGATIVE
Comment: NEGATIVE
Comment: NEGATIVE
Comment: NORMAL
Diagnosis: NEGATIVE
High risk HPV: NEGATIVE
Neisseria Gonorrhea: NEGATIVE
Trichomonas: NEGATIVE

## 2022-01-15 ENCOUNTER — Ambulatory Visit (INDEPENDENT_AMBULATORY_CARE_PROVIDER_SITE_OTHER): Payer: 59 | Admitting: Physician Assistant

## 2022-01-15 ENCOUNTER — Encounter: Payer: Self-pay | Admitting: Physician Assistant

## 2022-01-15 VITALS — BP 128/84 | HR 89 | Temp 98.8°F | Resp 16 | Ht 64.0 in | Wt 213.4 lb

## 2022-01-15 DIAGNOSIS — R52 Pain, unspecified: Secondary | ICD-10-CM

## 2022-01-15 DIAGNOSIS — J069 Acute upper respiratory infection, unspecified: Secondary | ICD-10-CM | POA: Diagnosis not present

## 2022-01-15 DIAGNOSIS — J029 Acute pharyngitis, unspecified: Secondary | ICD-10-CM

## 2022-01-15 DIAGNOSIS — R6883 Chills (without fever): Secondary | ICD-10-CM | POA: Diagnosis not present

## 2022-01-15 LAB — POCT INFLUENZA A/B
Influenza A, POC: NEGATIVE
Influenza B, POC: NEGATIVE

## 2022-01-15 LAB — POCT RAPID STREP A (OFFICE): Rapid Strep A Screen: NEGATIVE

## 2022-01-15 MED ORDER — BENZONATATE 100 MG PO CAPS: 100.0000 mg | ORAL_CAPSULE | Freq: Two times a day (BID) | ORAL | 0 refills | Status: DC | PRN

## 2022-01-15 NOTE — Patient Instructions (Addendum)
Based on your described symptoms and the duration of symptoms it is likely that you have a viral upper respiratory infection (often called a "cold") ? ?Symptoms can last for 3-10 days with lingering cough and intermittent symptoms lasting weeks after that. ? ?The goal of treatment at this time is to reduce your symptoms and discomfort  ? ?I have sent in Tessalon pearls for you to take twice per day to help with your cough ? ?You can use over the counter medications such as Dayquil/Nyquil, AlkaSeltzer formulations, etc to provide further relief of symptoms according to the manufacturer's instructions  ?If preferred you can use Coricidin to manage your symptoms rather than those medications mentioned above.  ? ? ?If your symptoms do not improve or become worse in the next 5-7 days please make an apt at the office so we can see you  ?Go to the ER if you begin to have more serious symptoms such as shortness of breath, trouble breathing, loss of consciousness, swelling around the eyes, high fever, severe lasting headaches, vision changes or neck pain/stiffness.  ? ?It was nice to meet you and I appreciate the opportunity to be involved in your care ? ?

## 2022-01-15 NOTE — Progress Notes (Signed)
? ? ?    Progress Note  ? ?Name: Loretta Jensen   MRN: [id number]    DOB: 08-09-1986   Date:01/15/2022 ? 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  ?      ? ?Subjective ? ?Chief Complaint ? ?Chief Complaint  ?Patient presents with  ? Sore Throat  ? Cough  ? Generalized Body Aches  ? Fever  ? ? ?HPI ? ?States this started yesterday with vomiting around 5 pm and progressed  ?States her throat is hurting  ?Aggravated by talking, swallowing ?Reports when she swallows it aggravates her ears ?Reports productive cough  ?Reports she has taken Ibuprofen, throat numbing spray, flonase and montelukast - nothing seems to provide relief ?Reports she has been drinking a lot of water and ice chips  ?Alleviating: nothing ?Reports her kids have allergies but is unsure if she contracted this from another person ? ? ?Patient Active Problem List  ? Diagnosis Date Noted  ? Reactive airway disease 03/17/2020  ? Chronic midline low back pain without sciatica 03/17/2020  ? Family history of breast cancer   ? Seasonal allergic rhinitis 01/19/2019  ? Family history of breast cancer in mother 01/19/2019  ? Menorrhagia with irregular cycle 01/19/2019  ? BMI 34.0-34.9,adult 03/04/2018  ? Migraine headache 01/23/2018  ? Occupational exposure to dust 05/23/2015  ? ? ?Past Surgical History:  ?Procedure Laterality Date  ? CESAREAN SECTION  2009  ? CESAREAN SECTION N/A 03/02/2017  ? Procedure: CESAREAN SECTION;  Surgeon: Conard Novak, MD;  Location: [facility] ORS;  Service: Obstetrics;  Laterality: N/A;  ? CESAREAN SECTION N/A 07/10/2018  ? Procedure: CESAREAN SECTION;  Surgeon: Conard Novak, MD;  Location: [facility] OR[facility];  Service: Obstetrics;  Laterality: N/A;  ? TUBAL LIGATION Bilateral 07/10/2018  ? Procedure: BILATERAL TUBAL LIGATION;  Surgeon: Conard Novak, MD;  Location: [facility] OR[facility];  Service: Obstetrics;  Laterality: Bilateral;  ? ? ?Family  History  ?Problem Relation Age of Onset  ? Cancer Mother   ? Breast cancer Mother 20  ? Cancer Maternal Grandfather   ?     2018 - liver  ? Breast cancer Other 75  ?     2018  ? Breast cancer Other 55  ?     2013  ? Breast cancer Other   ?     1990s  ? Cancer Paternal Grandmother   ?     unk type  ? Breast cancer Other   ? ? ?Social History  ? ?Tobacco Use  ? Smoking status: Never  ? Smokeless tobacco: Never  ?Substance Use Topics  ? Alcohol use: Yes  ?  Alcohol/week: 0.0 standard drinks  ?  Comment: occasionally  ? ? ? ?Current Outpatient Medications:  ?  albuterol (VENTOLIN HFA) 108 (90 Base) MCG/ACT inhaler, INHALE 2 PUFFS INTO THE LUNGS EVERY 6 HOURS AS NEEDED FOR WHEEZING OR SHORTNESS OF BREATH.  Would use routinely 2-3 times daily until present symptoms improve, then return to as needed use, Disp: 6.7 g, Rfl: 1 ?  fluticasone (FLONASE) 50 MCG/ACT nasal spray, Place 2 sprays into both nostrils daily., Disp: 16 g, Rfl: 6 ?  ibuprofen (ADVIL) 600 MG tablet, TAKE 1 TABLET BY MOUTH EVERY 8 HOURS AS NEEDED FOR HEADACHE, MILD PAIN, MODERATE PAIN OR CRAMPING., Disp: 90 tablet, Rfl: 1 ?  levocetirizine (XYZAL) 5 MG tablet, TAKE 1 TABLET(5 MG) BY MOUTH EVERY EVENING,  Disp: 90 tablet, Rfl: 2 ?  loratadine (CLARITIN) 10 MG tablet, Take 1 tablet (10 mg total) by mouth every morning., Disp: 90 tablet, Rfl: 2 ?  montelukast (SINGULAIR) 10 MG tablet, Take 1 tablet (10 mg total) by mouth at bedtime., Disp: 90 tablet, Rfl: 3 ?  Olopatadine HCl 0.2 % SOLN, Use 1 drop in each eye BID, Disp: 2.5 mL, Rfl: 0 ?  SYMBICORT 80-4.5 MCG/ACT inhaler, Inhale 2 puffs into the lungs every other day., Disp: 1 each, Rfl: 4 ? ?No Known Allergies ? ? ? ? ?Review of Systems  ?Constitutional:  Positive for chills, diaphoresis, fever and malaise/fatigue.  ?HENT:  Positive for ear pain and sore throat. Negative for congestion and sinus pain.   ?Respiratory:  Positive for cough and sputum production. Negative for shortness of breath and wheezing.    ?Gastrointestinal:  Positive for nausea and vomiting. Negative for diarrhea.  ?Musculoskeletal:  Positive for myalgias. Negative for joint pain.  ?Neurological:  Positive for headaches. Negative for dizziness.  ? ? ?Objective ? ?Vitals:  ? 01/15/22 1342  ?BP: 128/84  ?Pulse: 89  ?Resp: 16  ?Temp: 98.8 ?F (37.1 ?C)  ?TempSrc: Oral  ?SpO2: 100%  ?Weight: 213 lb 6.4 oz (96.8 kg)  ?Height: 5\' 4"  (1.626 m)  ? ? ?Body mass index is 36.63 kg/m?. ? ?Physical Exam ?Vitals reviewed.  ?Constitutional:   ?   Appearance: She is well-developed. She is obese.  ?HENT:  ?   Head: Normocephalic and atraumatic.  ?   Right Ear: Tympanic membrane and ear canal normal. No drainage or swelling. No middle ear effusion. Tympanic membrane is not erythematous.  ?   Left Ear: Tympanic membrane and ear canal normal. No drainage or swelling.  No middle ear effusion. Tympanic membrane is not erythematous.  ?   Nose: No congestion or rhinorrhea.  ?   Mouth/Throat:  ?   Mouth: Mucous membranes are moist.  ?   Pharynx: Uvula midline. No pharyngeal swelling, oropharyngeal exudate, posterior oropharyngeal erythema or uvula swelling.  ?   Tonsils: No tonsillar exudate or tonsillar abscesses.  ?Cardiovascular:  ?   Rate and Rhythm: Normal rate and regular rhythm.  ?   Heart sounds: Normal heart sounds.  ?Pulmonary:  ?   Effort: Pulmonary effort is normal. No respiratory distress.  ?   Breath sounds: Normal breath sounds. No wheezing, rhonchi or rales.  ?Neurological:  ?   General: No focal deficit present.  ?   Mental Status: She is alert and oriented to person, place, and time.  ?Psychiatric:     ?   Mood and Affect: Mood normal.     ?   Behavior: Behavior normal.  ? ? ? ?Recent Results (from the past 2160 hour(s))  ?Cytology - PAP     Status: None  ? Collection Time: 01/01/22  1:28 PM  ?Result Value Ref Range  ? High risk HPV Negative   ? Neisseria Gonorrhea Negative   ? Chlamydia Negative   ? Trichomonas Negative   ? Adequacy    ?  Satisfactory for  evaluation; transformation zone component PRESENT.  ? Diagnosis    ?  - Negative for intraepithelial lesion or malignancy (NILM)  ? Comment Normal Reference Range Trichomonas - Negative   ? Comment Normal Reference Range HPV - Negative   ? Comment Normal Reference Ranger Chlamydia - Negative   ? Comment    ?  Normal Reference Range Neisseria Gonorrhea - Negative  ?Comprehensive metabolic panel  Status: None  ? Collection Time: 01/01/22  1:44 PM  ?Result Value Ref Range  ? Glucose, Bld 90 65 - 99 mg/dL  ?  Comment: . ?           Fasting reference interval ?. ?  ? BUN 10 7 - 25 mg/dL  ? Creat 0.69 0.50 - 0.97 mg/dL  ? BUN/Creatinine Ratio NOT APPLICABLE 6 - 22 (calc)  ? Sodium 141 135 - 146 mmol/L  ? Potassium 4.2 3.5 - 5.3 mmol/L  ? Chloride 107 98 - 110 mmol/L  ? CO2 25 20 - 32 mmol/L  ? Calcium 8.7 8.6 - 10.2 mg/dL  ? Total Protein 6.9 6.1 - 8.1 g/dL  ? Albumin 3.9 3.6 - 5.1 g/dL  ? Globulin 3.0 1.9 - 3.7 g/dL (calc)  ? AG Ratio 1.3 1.0 - 2.5 (calc)  ? Total Bilirubin 0.3 0.2 - 1.2 mg/dL  ? Alkaline phosphatase (APISO) 58 31 - 125 U/L  ? AST 13 10 - 30 U/L  ? ALT 16 6 - 29 U/L  ?Lipid panel     Status: None  ? Collection Time: 01/01/22  1:44 PM  ?Result Value Ref Range  ? Cholesterol 121 <200 mg/dL  ? HDL 53 > OR = 50 mg/dL  ? Triglycerides 63 <150 mg/dL  ? LDL Cholesterol (Calc) 54 mg/dL (calc)  ?  Comment: Reference range: <100 ?Marland Kitchen ?Desirable range <100 mg/dL for primary prevention;   ?<70 mg/dL for patients with CHD or diabetic patients  ?with > or = 2 CHD risk factors. ?. ?LDL-C is now calculated using the Martin-Hopkins  ?calculation, which is a validated novel method providing  ?better accuracy than the Friedewald equation in the  ?estimation of LDL-C.  ?Horald Pollen et al. Lenox Ahr. 1696;789(38): 2061-2068  ?(http://education.[employer].com/faq/FAQ164) ?  ? Total CHOL/HDL Ratio 2.3 <5.0 (calc)  ? Non-HDL Cholesterol (Calc) 68 <101 mg/dL (calc)  ?  Comment: For patients with diabetes plus 1 major ASCVD risk   ?factor, treating to a non-HDL-C goal of <100 mg/dL  ?(LDL-C of <70 mg/dL) is considered a therapeutic  ?option. ?  ?Hemoglobin A1c     Status: Abnormal  ? Collection Time: 01/01/22  1:44 PM  ?Result V

## 2022-03-21 ENCOUNTER — Encounter: Payer: Self-pay | Admitting: Family Medicine

## 2022-04-02 ENCOUNTER — Ambulatory Visit: Payer: 59 | Admitting: Family Medicine

## 2022-04-04 NOTE — Progress Notes (Signed)
Patient ID: Loretta Jensen, female    DOB: 1986/07/28, 36 y.o.   MRN: [id number]  PCP: Danelle Berry, PA-C  Chief Complaint  Patient presents with   FMLA    Due to allergies    Subjective:   Loretta Jensen is a 36 y.o. female, presents to clinic with CC of the following:  HPI  Pt has appt today for FMLA/work paperwork Told to bring in at time of appt I do not see previously completed paperwork or prior appt for fMLA - last Feb pt was instructed in AVS to send if fmla and job description and she may need to be seen to complete - at that time was being seen for uncontrolled asthma   Pt with hx of asthma, recent COVID infection, sx on 1/6, positive test on 1/5 - just over a month ago. Pt had URI sx, some mild cough, had been vaccinated x 2 Asthma managed with symbicort - though she is none compliant  Singular and albuterol  During illness was using albuterol 2x a day at least      She was coughing, productive, the steroids helped a little bit     10/30/20 1522  Asthma History  Symptoms 0-2 days/week  Nighttime Awakenings 3-4/month  Asthma interference with normal activity Minor limitations  SABA use (not for EIB) > 2 days/wk--not > 1 x/day  Risk: Exacerbations requiring oral systemic steroids 0-1 / year  Asthma Severity Mild Persistent   Currently using rescue inhaler 2-4 d a week, triggers heat, dust at work On symbicort not using consistently Montelukast ran out of OTC allergy pill as needed   During summer/heat misses work for 1-3 days with exacerbation in a month Depends on the heat from the ovens  FMLA from 03/07/2022 - to next year  Asthma is currently uncontrolled she was unable to work last week or go out of her house  [facility] Visit from 04/05/2022 in [facility]   04/05/2022   1455  Asthma History   Symptoms >2 days/week  Nighttime Awakenings >1/wk but not nightly  Asthma interference with normal activity Extreme limitations   SABA use (not for EIB) > 2 days/wk--not > 1 x/day  Risk: Exacerbations requiring oral systemic steroids 2 or more / year  Asthma Severity Moderate Persistent     Patient Active Problem List   Diagnosis Date Noted   Reactive airway disease 03/17/2020   Chronic midline low back pain without sciatica 03/17/2020   Family history of breast cancer    Seasonal allergic rhinitis 01/19/2019   Family history of breast cancer in mother 01/19/2019   Menorrhagia with irregular cycle 01/19/2019   BMI 34.0-34.9,adult 03/04/2018   Migraine headache 01/23/2018   Occupational exposure to dust 05/23/2015      Current Outpatient Medications:    albuterol (VENTOLIN HFA) 108 (90 Base) MCG/ACT inhaler, INHALE 2 PUFFS INTO THE LUNGS EVERY 6 HOURS AS NEEDED FOR WHEEZING OR SHORTNESS OF BREATH.  Would use routinely 2-3 times daily until present symptoms improve, then return to as needed use, Disp: 6.7 g, Rfl: 1   benzonatate (TESSALON) 100 MG capsule, Take 1 capsule (100 mg total) by mouth 2 (two) times daily as needed for cough., Disp: 20 capsule, Rfl: 0   fluticasone (FLONASE) 50 MCG/ACT nasal spray, Place 2 sprays into both nostrils daily., Disp: 16 g, Rfl: 6   ibuprofen (ADVIL) 600 MG tablet, TAKE 1 TABLET BY MOUTH EVERY 8 HOURS AS NEEDED FOR HEADACHE, MILD PAIN,  MODERATE PAIN OR CRAMPING., Disp: 90 tablet, Rfl: 1   levocetirizine (XYZAL) 5 MG tablet, TAKE 1 TABLET(5 MG) BY MOUTH EVERY EVENING, Disp: 90 tablet, Rfl: 2   loratadine (CLARITIN) 10 MG tablet, Take 1 tablet (10 mg total) by mouth every morning., Disp: 90 tablet, Rfl: 2   montelukast (SINGULAIR) 10 MG tablet, Take 1 tablet (10 mg total) by mouth at bedtime., Disp: 90 tablet, Rfl: 3   Olopatadine HCl 0.2 % SOLN, Use 1 drop in each eye BID, Disp: 2.5 mL, Rfl: 0   SYMBICORT 80-4.5 MCG/ACT inhaler, Inhale 2 puffs into the lungs every other day., Disp: 1 each, Rfl: 4   No Known Allergies   Social History   Tobacco Use   Smoking status: Never    Smokeless tobacco: Never  Vaping Use   Vaping Use: Never used  Substance Use Topics   Alcohol use: Yes    Alcohol/week: 0.0 standard drinks of alcohol    Comment: occasionally   Drug use: No      Chart Review Today: I personally reviewed active problem list, medication list, allergies, family history, social history, health maintenance, notes from last encounter, lab results, imaging with the patient/caregiver today.   Review of Systems  Constitutional: Negative.   HENT: Negative.    Eyes: Negative.   Respiratory: Negative.    Cardiovascular: Negative.   Gastrointestinal: Negative.   Endocrine: Negative.   Genitourinary: Negative.   Musculoskeletal: Negative.   Skin: Negative.   Allergic/Immunologic: Negative.   Neurological: Negative.   Hematological: Negative.   Psychiatric/Behavioral: Negative.    All other systems reviewed and are negative.      Objective:   Vitals:   04/05/22 1433  BP: 112/68  Pulse: 82  Resp: 16  Temp: 98.7 F (37.1 C)  TempSrc: Oral  SpO2: 99%  Weight: 212 lb 14.4 oz (96.6 kg)  Height: 5\' 4"  (1.626 m)    Body mass index is 36.54 kg/m.  Physical Exam Vitals and nursing note reviewed.  Constitutional:      General: She is not in acute distress.    Appearance: Normal appearance. She is well-developed. She is obese. She is not ill-appearing, toxic-appearing or diaphoretic.     Interventions: Face mask in place.  HENT:     Head: Normocephalic and atraumatic.     Right Ear: External ear normal.     Left Ear: External ear normal.  Eyes:     General: Lids are normal. No scleral icterus.       Right eye: No discharge.        Left eye: No discharge.     Conjunctiva/sclera: Conjunctivae normal.  Neck:     Trachea: Phonation normal. No tracheal deviation.  Cardiovascular:     Rate and Rhythm: Normal rate and regular rhythm.     Pulses: Normal pulses.          Radial pulses are 2+ on the right side and 2+ on the left side.        Posterior tibial pulses are 2+ on the right side and 2+ on the left side.     Heart sounds: Normal heart sounds. No murmur heard.    No friction rub. No gallop.  Pulmonary:     Effort: Pulmonary effort is normal. No respiratory distress.     Breath sounds: Normal breath sounds. No stridor. No wheezing, rhonchi or rales.  Chest:     Chest wall: No tenderness.  Abdominal:     General:  Bowel sounds are normal. There is no distension.     Palpations: Abdomen is soft.  Musculoskeletal:     Right lower leg: No edema.     Left lower leg: No edema.  Skin:    General: Skin is warm and dry.     Coloration: Skin is not jaundiced or pale.     Findings: No rash.  Neurological:     Mental Status: She is alert.     Motor: No abnormal muscle tone.     Gait: Gait normal.  Psychiatric:        Mood and Affect: Mood normal.        Speech: Speech normal.        Behavior: Behavior normal.        Results for orders placed or performed in visit on 01/15/22  POCT Influenza A/B  Result Value Ref Range   Influenza A, POC Negative Negative   Influenza B, POC Negative Negative  POCT rapid strep A  Result Value Ref Range   Rapid Strep A Screen Negative Negative       Assessment & Plan:     ICD-10-CM   1. Moderate persistent reactive airway disease with acute exacerbation  J45.41 montelukast (SINGULAIR) 10 MG tablet    budesonide-formoterol (SYMBICORT) 160-4.5 MCG/ACT inhaler    albuterol (VENTOLIN HFA) 108 (90 Base) MCG/ACT inhaler   uncontrolled disease, increase maintenence inhaler dose 2x daily and other allergy and asthma control meds    2. Seasonal allergic rhinitis, unspecified trigger  J30.2 montelukast (SINGULAIR) 10 MG tablet    cetirizine (ZYRTEC) 10 MG tablet   refill in singulair and antihistamine OTC daily     3. Encounter for completion of form with patient  Z02.89    FMLA paperwork done to allow for acute sx and exacerbations      Pt encouraged to f/up with allergy and  asthma specialists for further eval     Danelle Berry, PA-C 04/04/22 4:56 PM

## 2022-04-05 ENCOUNTER — Encounter: Payer: Self-pay | Admitting: Family Medicine

## 2022-04-05 ENCOUNTER — Ambulatory Visit (INDEPENDENT_AMBULATORY_CARE_PROVIDER_SITE_OTHER): Payer: 59 | Admitting: Family Medicine

## 2022-04-05 VITALS — BP 112/68 | HR 82 | Temp 98.7°F | Resp 16 | Ht 64.0 in | Wt 212.9 lb

## 2022-04-05 DIAGNOSIS — J4541 Moderate persistent asthma with (acute) exacerbation: Secondary | ICD-10-CM | POA: Diagnosis not present

## 2022-04-05 DIAGNOSIS — J302 Other seasonal allergic rhinitis: Secondary | ICD-10-CM

## 2022-04-05 DIAGNOSIS — Z0289 Encounter for other administrative examinations: Secondary | ICD-10-CM | POA: Diagnosis not present

## 2022-04-05 MED ORDER — CETIRIZINE HCL 10 MG PO TABS: 10.0000 mg | ORAL_TABLET | Freq: Every day | ORAL | 11 refills | Status: DC

## 2022-04-05 MED ORDER — MONTELUKAST SODIUM 10 MG PO TABS: 10.0000 mg | ORAL_TABLET | Freq: Every day | ORAL | 3 refills | Status: DC

## 2022-04-05 MED ORDER — BUDESONIDE-FORMOTEROL FUMARATE 160-4.5 MCG/ACT IN AERO: 2.0000 | INHALATION_SPRAY | Freq: Two times a day (BID) | RESPIRATORY_TRACT | 3 refills | Status: DC

## 2022-04-05 MED ORDER — ALBUTEROL SULFATE HFA 108 (90 BASE) MCG/ACT IN AERS: 2.0000 | INHALATION_SPRAY | RESPIRATORY_TRACT | 2 refills | Status: DC | PRN

## 2022-04-10 ENCOUNTER — Encounter: Payer: Self-pay | Admitting: Family Medicine

## 2022-04-10 NOTE — Telephone Encounter (Signed)
Copied from CRM [id number]. Topic: General - Inquiry >> Apr 10, 2022  1:04 PM De Blanch wrote: Reason for CRM: Pt is requesting a call back from Leisa's nurse regarding her FMLA paperwork.  Pt stated PCP forgot to fill out one part. Pt stated she needs this done today.   Pt declined to provide further details stated she's at work and cannot talk right now.   Please advise.

## 2022-04-15 ENCOUNTER — Other Ambulatory Visit: Payer: Self-pay | Admitting: Family Medicine

## 2022-04-15 DIAGNOSIS — M545 Low back pain, unspecified: Secondary | ICD-10-CM

## 2022-04-17 NOTE — Telephone Encounter (Signed)
Requested medication (s) are due for refill today: yes  Requested medication (s) are on the active medication list: yes  Last refill:  01/01/22  Future visit scheduled: yes  Notes to clinic:  Unable to refill per protocol, last refill by provider on 01/01/22 for 90 days and 1 refill. Medication request is too soon, routing for review.     Requested Prescriptions  Pending Prescriptions Disp Refills   ibuprofen (ADVIL) 600 MG tablet [Pharmacy Med Name: IBUPROFEN 600MG TABLETS] 90 tablet 1    Sig: TAKE 1 TABLET BY MOUTH EVERY 8 HOURS AS NEEDED FOR HEADACHE, MILD PAIN, MODERATE PAIN OR CRAMPING.     Analgesics:  NSAIDS Failed - 04/15/2022  9:33 PM      Failed - Manual Review: Labs are only required if the patient has taken medication for more than 8 weeks.      Failed - HGB in normal range and within 360 days    Hemoglobin  Date Value Ref Range Status  01/01/2022 9.8 (L) 11.7 - 15.5 g/dL Final  04/30/2018 10.8 (L) 11.1 - 15.9 g/dL Final  07/30/2016 12.1 g/dL Final         Failed - PLT in normal range and within 360 days    Platelets  Date Value Ref Range Status  01/01/2022 443 (H) 140 - 400 Thousand/uL Final  04/30/2018 252 150 - 450 x10E3/uL Final  07/30/2016 300 K/L Final         Failed - HCT in normal range and within 360 days    HCT  Date Value Ref Range Status  01/01/2022 32.3 (L) 35.0 - 45.0 % Final  07/30/2016 36 % Final   Hematocrit  Date Value Ref Range Status  04/30/2018 32.5 (L) 34.0 - 46.6 % Final         Passed - Cr in normal range and within 360 days    Creat  Date Value Ref Range Status  01/01/2022 0.69 0.50 - 0.97 mg/dL Final         Passed - eGFR is 30 or above and within 360 days    GFR, Est African American  Date Value Ref Range Status  04/23/2019 136 > OR = 60 mL/min/1.62m Final   GFR, Est Non African American  Date Value Ref Range Status  04/23/2019 117 > OR = 60 mL/min/1.74mFinal   GFR, Estimated  Date Value Ref Range Status   04/27/2021 >60 >60 mL/min Final    Comment:    (NOTE) Calculated using the CKD-EPI Creatinine Equation (2021)          Passed - Patient is not pregnant      Passed - Valid encounter within last 12 months    Recent Outpatient Visits           1 week ago Moderate persistent reactive airway disease with acute exacerbation   [facility]   3 months [facility] respiratory tract infection, unspecified type   [facility]   3 months ago [facility] physical [facility]   9 months [place]al upper respiratory tract infection   [place]   1 year ago Mild intermittent reactive airway disease with acute exacerbation   [facility] [place]

## 2022-04-22 NOTE — Telephone Encounter (Signed)
We can print this and see if we need to add anything to her FMLA stuff? I think we already submitted it? If not please add to the first page and submit.  Thanks Danelle Berry, PA-C

## 2022-04-22 NOTE — Telephone Encounter (Signed)
Please advise 

## 2022-05-06 ENCOUNTER — Encounter: Payer: Self-pay | Admitting: Family Medicine

## 2022-06-27 ENCOUNTER — Emergency Department: Payer: 59

## 2022-06-27 ENCOUNTER — Encounter: Payer: Self-pay | Admitting: *Deleted

## 2022-06-27 ENCOUNTER — Emergency Department
Admission: EM | Admit: 2022-06-27 | Discharge: 2022-06-27 | Disposition: A | Discharge status: Home / Self Care | Payer: 59 | Attending: Emergency Medicine | Admitting: Emergency Medicine

## 2022-06-27 ENCOUNTER — Other Ambulatory Visit: Payer: Self-pay

## 2022-06-27 DIAGNOSIS — K2971 Gastritis, unspecified, with bleeding: Secondary | ICD-10-CM | POA: Insufficient documentation

## 2022-06-27 DIAGNOSIS — R079 Chest pain, unspecified: Secondary | ICD-10-CM | POA: Diagnosis present

## 2022-06-27 DIAGNOSIS — K2970 Gastritis, unspecified, without bleeding: Secondary | ICD-10-CM

## 2022-06-27 LAB — BASIC METABOLIC PANEL
Anion gap: 6 (ref 5–15)
BUN: 13 mg/dL (ref 6–20)
CO2: 25 mmol/L (ref 22–32)
Calcium: 8.8 mg/dL — ABNORMAL LOW (ref 8.9–10.3)
Chloride: 108 mmol/L (ref 98–111)
Creatinine, Ser: 0.71 mg/dL (ref 0.44–1.00)
GFR, Estimated: >60 mL/min (ref >60)
Glucose, Bld: 102 mg/dL — ABNORMAL HIGH (ref 70–99)
Potassium: 3.8 mmol/L (ref 3.5–5.1)
Sodium: 139 mmol/L (ref 135–145)

## 2022-06-27 LAB — CBC
HCT: 31.7 % — ABNORMAL LOW (ref 36.0–46.0)
Hemoglobin: 9.5 g/dL — ABNORMAL LOW (ref 12.0–15.0)
MCH: 21.3 pg — ABNORMAL LOW (ref 26.0–34.0)
MCHC: 30 g/dL (ref 30.0–36.0)
MCV: 70.9 fL — ABNORMAL LOW (ref 80.0–100.0)
Platelets: 410 10*3/uL — ABNORMAL HIGH (ref 150–400)
RBC: 4.47 MIL/uL (ref 3.87–5.11)
RDW: 17 % — ABNORMAL HIGH (ref 11.5–15.5)
WBC: 7 10*3/uL (ref 4.0–10.5)
nRBC: 0 % (ref 0.0–0.2)

## 2022-06-27 LAB — TROPONIN I (HIGH SENSITIVITY): Troponin I (High Sensitivity): <2 ng/L (ref <18)

## 2022-06-27 LAB — POC URINE PREG, ED: Preg Test, Ur: NEGATIVE

## 2022-06-27 MED ORDER — LIDOCAINE VISCOUS HCL 2 % MT SOLN
15.0000 mL | Freq: Once | OROMUCOSAL | Status: AC
  Administered 2022-06-27: 15 mL via ORAL
  Filled 2022-06-27: qty 15

## 2022-06-27 MED ORDER — FAMOTIDINE 20 MG PO TABS: 20.0000 mg | ORAL_TABLET | Freq: Every day | ORAL | 1 refills | Status: DC

## 2022-06-27 MED ORDER — HYDROXYZINE HCL 25 MG PO TABS
25.0000 mg | ORAL_TABLET | Freq: Once | ORAL | Status: AC
  Administered 2022-06-27: 25 mg via ORAL
  Filled 2022-06-27: qty 1

## 2022-06-27 MED ORDER — SUCRALFATE 1 G PO TABS: 1.0000 g | ORAL_TABLET | Freq: Four times a day (QID) | ORAL | 0 refills | Status: AC

## 2022-06-27 MED ORDER — ALUM & MAG HYDROXIDE-SIMETH 200-200-20 MG/5ML PO SUSP
30.0000 mL | Freq: Once | ORAL | Status: AC
  Administered 2022-06-27: 30 mL via ORAL
  Filled 2022-06-27: qty 30

## 2022-06-27 NOTE — ED Triage Notes (Addendum)
Per patient's report, patient reports left-side chest pain that feels like squeezing and radiates down left arm that feels numb. Patient reports this is recurrent "for awhile" and happens when she is stressed which is often.

## 2022-06-27 NOTE — Discharge Instructions (Signed)
Please seek medical attention for any high fevers, chest pain, shortness of breath, change in behavior, persistent vomiting, bloody stool or any other new or concerning symptoms.  

## 2022-06-27 NOTE — ED Provider Notes (Signed)
[facility] Provider Note    Event Date/Time   First MD Initiated Contact with Patient 06/27/22 1022     (approximate)   History   Chest Pain   HPI  Loretta Jensen is a 36 y.o. female  who presents to the emergency department because of concern for chest pain. The patient states the pain comes and goes. She has had it before when she is stressed. She has been under a lot of stress recently at home. Her daughter was just diagnosed with autism which has been tough and she has not had good sleep recently. The pain is located in the center part of her chest. It does radiate to her left arm. She denies any shortness of breath or nausea.       Physical Exam   Triage Vital Signs: ED Triage Vitals  Enc Vitals Group     BP 06/27/22 1003 121/89     Pulse Rate 06/27/22 1003 81     Resp 06/27/22 1003 16     Temp 06/27/22 1003 98.3 F (36.8 C)     Temp Source 06/27/22 1003 Oral     SpO2 06/27/22 1003 100 %     Weight 06/27/22 0958 213 lb (96.6 kg)     Height 06/27/22 0958 5\' 4"  (1.626 m)     Head Circumference --      Peak Flow --      Pain Score 06/27/22 0958 8     Pain Loc --      Pain Edu? --      Excl. in GC? --     Most recent vital signs: Vitals:   06/27/22 1003  BP: 121/89  Pulse: 81  Resp: 16  Temp: 98.3 F (36.8 C)  SpO2: 100%    General: Awake, alert, oriented. CV:  Good peripheral perfusion. Regular rate and rhythm. Resp:  Normal effort. Lungs clear. Abd:  No distention.  Other:  Anxious, tearful.   ED Results / Procedures / Treatments   Labs (all labs ordered are listed, but only abnormal results are displayed) Labs Reviewed  CBC - Abnormal; Notable for the following components:      Result Value   Hemoglobin 9.5 (*)    HCT 31.7 (*)    MCV 70.9 (*)    MCH 21.3 (*)    RDW 17.0 (*)    Platelets 410 (*)    All other components within normal limits  BASIC METABOLIC PANEL  POC URINE PREG, ED  TROPONIN I (HIGH  SENSITIVITY)     EKG  I, 08/27/22, attending physician, personally viewed and interpreted this EKG  EKG Time: 1000 Rate: 71 Rhythm: normal sinus rhythm  Axis: normal Intervals: qtc 439 QRS: narrow ST changes: no st elevation Impression: normal ekg   RADIOLOGY I independently interpreted and visualized the CXR. My interpretation: No pneumonia. No pneumothorax. Radiology interpretation:  IMPRESSION:  No active cardiopulmonary disease.       PROCEDURES:  Critical Care performed: No  Procedures   MEDICATIONS ORDERED IN ED: Medications - No data to display   IMPRESSION / MDM / ASSESSMENT AND PLAN / ED COURSE  I reviewed the triage vital signs and the nursing notes.                              Differential diagnosis includes, but is not limited to, stress reaction, ACS, pneumonia, pneumothorax, gerd.  Patient's presentation  is most consistent with acute presentation with potential threat to life or bodily function.  Patient presents to the emergency department today because of concern for chest pain and stress. On exam patient does appear slightly tearful. EKG without concerning abnormality. CXR without pneumonia or pneumothorax. Troponin was negative, and given that symptoms have been one and off for days would expect some elevation at this point if pain was caused by ACS. Will try giving patient atarax to see if that helps with her symptoms. Atarax without any significant improvement. GI cocktail was then tried, and patient stated she did feel improvement after that. Discussed possibility of heart burn with patient. Given improvement will plan on discharging. Discussed importance of follow up with her primary care provider.    FINAL CLINICAL IMPRESSION(S) / ED DIAGNOSES   Final diagnoses:  Gastritis, presence of bleeding unspecified, unspecified chronicity, unspecified gastritis type     Rx / DC Orders   ED Discharge Orders     None        Note:   This document was prepared using Dragon voice recognition software and may include unintentional dictation errors.    Nance Pear, MD 06/27/22 1330

## 2022-07-18 ENCOUNTER — Other Ambulatory Visit: Payer: Self-pay

## 2022-07-18 ENCOUNTER — Encounter: Payer: Self-pay | Admitting: Nurse Practitioner

## 2022-07-18 ENCOUNTER — Ambulatory Visit (INDEPENDENT_AMBULATORY_CARE_PROVIDER_SITE_OTHER): Payer: 59 | Admitting: Nurse Practitioner

## 2022-07-18 VITALS — BP 124/82 | HR 100 | Temp 99.0°F | Resp 18 | Ht 64.0 in | Wt 204.2 lb

## 2022-07-18 DIAGNOSIS — J069 Acute upper respiratory infection, unspecified: Secondary | ICD-10-CM | POA: Diagnosis not present

## 2022-07-18 DIAGNOSIS — H6502 Acute serous otitis media, left ear: Secondary | ICD-10-CM | POA: Diagnosis not present

## 2022-07-18 LAB — POCT INFLUENZA A/B
Influenza A, POC: NEGATIVE
Influenza B, POC: NEGATIVE

## 2022-07-18 LAB — POCT RAPID STREP A (OFFICE): Rapid Strep A Screen: NEGATIVE

## 2022-07-18 MED ORDER — AMOXICILLIN-POT CLAVULANATE 875-125 MG PO TABS: 1.0000 | ORAL_TABLET | Freq: Two times a day (BID) | ORAL | 0 refills | Status: AC

## 2022-07-18 NOTE — Progress Notes (Signed)
BP 124/82   Pulse 100   Temp 99 F (37.2 C) (Oral)   Resp 18   Ht 5\' 4"  (1.626 m)   Wt 204 lb 3.2 oz (92.6 kg)   LMP 06/20/2022 Comment: tubal ligation  SpO2 98%   BMI 35.05 kg/m    Subjective:    Patient ID: Loretta Jensen, female    DOB: May 12, 1986, 36 y.o.   MRN: [id number]  HPI: Loretta Jensen is a 36 y.o. female  Chief Complaint  Patient presents with   Sore Throat    Hard to swallow, Covid test negative   Ear Pain   URI: she says does report that she has nasal congestion, sore throat, left ear pain, cough and fever. She says symptoms started on Monday.  She says she did a home covid test and it was negative, her rapid strep was negative.  She declined covid PCR but will do flu test.  She says she has taken allergy medication, ibuprofen and nyquil. She denies any shortness of breath.  She is currently taking zyrtec, Flonase and Singulair.  Rapid flu negative.  Upon inspection patient does have a left otitis media.  Will send in Augmentin. Discussed with patient continue taking Zyrtec, Flonase, and singulair.  Start taking mucinex.  Gargle with salt water, push fluids and get rest.  She reports she has tessalon perls for her cough, will reach out if she runs out.    Relevant past medical, surgical, family and social history reviewed and updated as indicated. Interim medical history since our last visit reviewed. Allergies and medications reviewed and updated.  Review of Systems  Constitutional: positive for fever or negative weight change.  HEENT: positive for nasal congestion, sore throat, left ear pain Respiratory: positive for cough and negative for shortness of breath.   Cardiovascular: Negative for chest pain or palpitations.  Gastrointestinal: Negative for abdominal pain, no bowel changes.  Musculoskeletal: Negative for gait problem or joint swelling.  Skin: Negative for rash.  Neurological: Negative for dizziness or headache.  No other specific complaints  in a complete review of systems (except as listed in HPI above).      Objective:    BP 124/82   Pulse 100   Temp 99 F (37.2 C) (Oral)   Resp 18   Ht 5\' 4"  (1.626 m)   Wt 204 lb 3.2 oz (92.6 kg)   LMP 06/20/2022 Comment: tubal ligation  SpO2 98%   BMI 35.05 kg/m   Wt Readings from Last 3 Encounters:  07/18/22 204 lb 3.2 oz (92.6 kg)  06/27/22 213 lb (96.6 kg)  04/05/22 212 lb 14.4 oz (96.6 kg)    Physical Exam  Constitutional: Patient appears well-developed and well-nourished. Obese  No distress.  HEENT: head atraumatic, normocephalic, pupils equal and reactive to light, ears right tm clear, left tm red and bulging, neck supple, throat with red with exudate Cardiovascular: Normal rate, regular rhythm and normal heart sounds.  No murmur heard. No BLE edema. Pulmonary/Chest: Effort normal and breath sounds normal. No respiratory distress. Abdominal: Soft.  There is no tenderness. Psychiatric: Patient has a normal mood and affect. behavior is normal. Judgment and thought content normal.  Results for orders placed or performed in visit on 07/18/22  POCT rapid strep A  Result Value Ref Range   Rapid Strep A Screen Negative Negative  POCT Influenza A/B  Result Value Ref Range   Influenza A, POC Negative Negative   Influenza B, POC Negative Negative  Assessment & Plan:   Problem List Items Addressed This Visit   None Visit Diagnoses     Viral upper respiratory tract infection    -  Primary   Continue taking Zyrtec, Flonase, Singulair.  Start taking Mucinex.  Can also gargle with salt water.  Start taking Augmentin.  Push fluids and get rest.   Relevant Orders   POCT rapid strep A (Completed)   POCT Influenza A/B (Completed)   Non-recurrent acute serous otitis media of left ear       Start augmentin   Relevant Medications   amoxicillin-clavulanate (AUGMENTIN) 875-125 MG tablet     Has prescription for Tessalon Perles at home from another URI.  Patient is not sure  how much she has left.  Patient states if she runs out she will reach out for a refill.  Follow up plan: Return if symptoms worsen or fail to improve.

## 2022-07-25 ENCOUNTER — Other Ambulatory Visit: Payer: Self-pay | Admitting: Physician Assistant

## 2022-07-25 ENCOUNTER — Encounter: Payer: Self-pay | Admitting: Nurse Practitioner

## 2022-07-25 DIAGNOSIS — J069 Acute upper respiratory infection, unspecified: Secondary | ICD-10-CM

## 2022-07-26 ENCOUNTER — Other Ambulatory Visit: Payer: Self-pay | Admitting: Nurse Practitioner

## 2022-07-26 DIAGNOSIS — J069 Acute upper respiratory infection, unspecified: Secondary | ICD-10-CM

## 2022-07-26 MED ORDER — BENZONATATE 100 MG PO CAPS: 100.0000 mg | ORAL_CAPSULE | Freq: Two times a day (BID) | ORAL | 0 refills | Status: DC | PRN

## 2022-08-05 ENCOUNTER — Other Ambulatory Visit: Payer: Self-pay

## 2022-08-05 DIAGNOSIS — J4541 Moderate persistent asthma with (acute) exacerbation: Secondary | ICD-10-CM

## 2022-08-05 MED ORDER — BUDESONIDE-FORMOTEROL FUMARATE 160-4.5 MCG/ACT IN AERO: 2.0000 | INHALATION_SPRAY | Freq: Two times a day (BID) | RESPIRATORY_TRACT | 3 refills | Status: DC

## 2022-08-05 NOTE — Telephone Encounter (Signed)
90 day supply being requested by insurance. I do not know how to fill for 90 days. Please advice

## 2022-08-26 ENCOUNTER — Encounter: Payer: Self-pay | Admitting: Family Medicine

## 2022-08-26 ENCOUNTER — Ambulatory Visit (INDEPENDENT_AMBULATORY_CARE_PROVIDER_SITE_OTHER): Payer: 59 | Admitting: Family Medicine

## 2022-08-26 VITALS — BP 118/76 | HR 99 | Temp 98.7°F | Resp 16 | Ht 64.0 in | Wt 206.9 lb

## 2022-08-26 DIAGNOSIS — Z20828 Contact with and (suspected) exposure to other viral communicable diseases: Secondary | ICD-10-CM

## 2022-08-26 DIAGNOSIS — J4541 Moderate persistent asthma with (acute) exacerbation: Secondary | ICD-10-CM | POA: Diagnosis not present

## 2022-08-26 DIAGNOSIS — R509 Fever, unspecified: Secondary | ICD-10-CM

## 2022-08-26 DIAGNOSIS — J020 Streptococcal pharyngitis: Secondary | ICD-10-CM

## 2022-08-26 LAB — POCT INFLUENZA A/B
Influenza A, POC: NEGATIVE
Influenza B, POC: NEGATIVE

## 2022-08-26 LAB — POCT RAPID STREP A (OFFICE): Rapid Strep A Screen: POSITIVE — AB

## 2022-08-26 MED ORDER — AMOXICILLIN 500 MG PO TABS: 500.0000 mg | ORAL_TABLET | Freq: Two times a day (BID) | ORAL | 0 refills | Status: AC

## 2022-08-26 MED ORDER — PREDNISONE 20 MG PO TABS: 40.0000 mg | ORAL_TABLET | Freq: Every day | ORAL | 0 refills | Status: AC

## 2022-08-26 NOTE — Patient Instructions (Signed)
You should not be contagious 24 hours after starting the antibiotic.  Do not share drinks/food/utensils etc and replace toothbrushes in 2-3 days to avoid reexposure to the illness  Strep Throat, Adult Strep throat is an infection of the throat. It is caused by germs (bacteria). Strep throat is common during the cold months of the year. It mostly affects children who are 51-35 years old. However, people of all ages can get it at any time of the year. This infection spreads from person to person through coughing, sneezing, or having close contact. What are the causes? This condition is caused by the Streptococcus pyogenes germ. What increases the risk? You care for young children. Children are more likely to get strep throat and may spread it to others. You go to crowded places. Germs can spread easily in such places. You kiss or touch someone who has strep throat. What are the signs or symptoms? Fever or chills. Redness, swelling, or pain in the tonsils or throat. Pain or trouble when swallowing. White or yellow spots on the tonsils or throat. Tender glands in the neck and under the jaw. Bad breath. Red rash all over the body. This is rare. How is this treated? Medicines that kill germs (antibiotics). Medicines that treat pain or fever. These include: Ibuprofen or acetaminophen. Aspirin, only for people who are over the age of 36. Cough drops. Throat sprays. Follow these instructions at home: Medicines  Take over-the-counter and prescription medicines only as told by your doctor. Take your antibiotic medicine as told by your doctor. Do not stop taking the antibiotic even if you start to feel better. Eating and drinking  If you have trouble swallowing, eat soft foods until your throat feels better. Drink enough fluid to keep your pee (urine) pale yellow. To help with pain, you may have: Warm fluids, such as soup and tea. Cold fluids, such as frozen desserts or popsicles. General  instructions Rinse your mouth (gargle) with a salt-water mixture 3-4 times a day or as needed. To make a salt-water mixture, dissolve -1 tsp (3-6 g) of salt in 1 cup (237 mL) of warm water. Rest as much as you can. Stay home from work or school until you have been taking antibiotics for 24 hours. Do not smoke or use any products that contain nicotine or tobacco. If you need help quitting, ask your doctor. Keep all follow-up visits. How is this prevented?  Do not share food, drinking cups, or personal items. They can cause the germs to spread. Wash your hands well with soap and water. Make sure that all people in your house wash their hands well. Have family members tested if they have a fever or a sore throat. They may need an antibiotic if they have strep throat. Contact a doctor if: You have swelling in your neck that keeps getting bigger. You get a rash, cough, or earache. You cough up a thick fluid that is green, yellow-brown, or bloody. You have pain that does not get better with medicine. Your symptoms get worse instead of getting better. You have a fever. Get help right away if: You vomit. You have a very bad headache. Your neck hurts or feels stiff. You have chest pain or are short of breath. You have drooling, very bad throat pain, or changes in your voice. Your neck is swollen, or the skin gets red and tender. Your mouth is dry, or you are peeing less than normal. You keep feeling more tired or have trouble  waking up. Your joints are red or painful. These symptoms may be an emergency. Do not wait to see if the symptoms will go away. Get help right away. Call your local emergency services (911 in the [place] Summary Strep throat is an infection of the throat. It is caused by germs (bacteria). This infection can spread from person to person through coughing, sneezing, or having close contact. Take your medicines, including antibiotics, as told by your doctor. Do not stop  taking the antibiotic even if you start to feel better. To prevent the spread of germs, wash your hands well with soap and water. Have others do the same. Do not share food, drinking cups, or personal items. Get help right away if you have a bad headache, chest pain, shortness of breath, a stiff or painful neck, or you vomit. This information is not intended to replace advice given to you by your health care provider. Make sure you discuss any questions you have with your health care provider. Document Revised: 01/02/2021 Document Reviewed: 01/02/2021 Elsevier Patient Education  2023 [employer].

## 2022-08-26 NOTE — Progress Notes (Signed)
Patient ID: Loretta Jensen, female    DOB: 07/31/1986, 36 y.o.   MRN: [id number]  PCP: Danelle Berry, PA-C  Chief Complaint  Patient presents with   Sore Throat    Pt has her daughter sick was positive for flu   Fever    Last night   Cough    Subjective:   Loretta Jensen is a 36 y.o. female, presents to clinic with CC of the following:  HPI   Results for orders placed or performed in visit on 08/26/22  POCT rapid strep A  Result Value Ref Range   Rapid Strep A Screen Positive (A) Negative  POCT Influenza A/B  Result Value Ref Range   Influenza A, POC Negative Negative   Influenza B, POC Negative Negative   Pt sick onset 1-2 d ago and her daughter is positive for flu She presents with fever, sore throat and cough She had severe fatigue, body ache, fever, sweats chills, sore throat, some coughing and tightness and needed her inhaler last night Right now does not feel SOB/coughing wheezy tight, frequency asthma exacerbations No hx of recurrent strep throat    Patient Active Problem List   Diagnosis Date Noted   Reactive airway disease 03/17/2020   Chronic midline low back pain without sciatica 03/17/2020   Family history of breast cancer    Seasonal allergic rhinitis 01/19/2019   Family history of breast cancer in mother 01/19/2019   Menorrhagia with irregular cycle 01/19/2019   BMI 34.0-34.9,adult 03/04/2018   Migraine headache 01/23/2018   Occupational exposure to dust 05/23/2015      Current Outpatient Medications:    albuterol (VENTOLIN HFA) 108 (90 Base) MCG/ACT inhaler, Inhale 2 puffs into the lungs every 4 (four) hours as needed for wheezing or shortness of breath., Disp: 2 each, Rfl: 2   benzonatate (TESSALON) 100 MG capsule, Take 1 capsule (100 mg total) by mouth 2 (two) times daily as needed for cough., Disp: 20 capsule, Rfl: 0   budesonide-formoterol (SYMBICORT) 160-4.5 MCG/ACT inhaler, Inhale 2 puffs into the lungs 2 (two) times daily., Disp:  3 each, Rfl: 3   cetirizine (ZYRTEC) 10 MG tablet, Take 1 tablet (10 mg total) by mouth daily., Disp: 30 tablet, Rfl: 11   famotidine (PEPCID) 20 MG tablet, Take 1 tablet (20 mg total) by mouth daily., Disp: 30 tablet, Rfl: 1   fluticasone (FLONASE) 50 MCG/ACT nasal spray, Place 2 sprays into both nostrils daily., Disp: 16 g, Rfl: 6   ibuprofen (ADVIL) 600 MG tablet, TAKE 1 TABLET BY MOUTH EVERY 8 HOURS AS NEEDED FOR HEADACHE, MILD PAIN, MODERATE PAIN, OR CRAMPING, Disp: 90 tablet, Rfl: 1   montelukast (SINGULAIR) 10 MG tablet, Take 1 tablet (10 mg total) by mouth at bedtime., Disp: 90 tablet, Rfl: 3   Olopatadine HCl 0.2 % SOLN, Use 1 drop in each eye BID, Disp: 2.5 mL, Rfl: 0   sucralfate (CARAFATE) 1 g tablet, Take 1 tablet (1 g total) by mouth 4 (four) times daily., Disp: 60 tablet, Rfl: 0   No Known Allergies   Social History   Tobacco Use   Smoking status: Never   Smokeless tobacco: Never  Vaping Use   Vaping Use: Never used  Substance Use Topics   Alcohol use: Yes    Alcohol/week: 0.0 standard drinks of alcohol    Comment: occasionally   Drug use: No      Chart Review Today: I personally reviewed active problem list, medication list, allergies, family history,  social history, health maintenance, notes from last encounter, lab results, imaging with the patient/caregiver today.   Review of Systems  Constitutional: Negative.   HENT: Negative.    Eyes: Negative.   Respiratory: Negative.    Cardiovascular: Negative.   Gastrointestinal: Negative.   Endocrine: Negative.   Genitourinary: Negative.   Musculoskeletal: Negative.   Skin: Negative.   Allergic/Immunologic: Negative.   Neurological: Negative.   Hematological: Negative.   Psychiatric/Behavioral: Negative.    All other systems reviewed and are negative.      Objective:   Vitals:   08/26/22 1005  BP: 118/76  Pulse: 99  Resp: 16  Temp: 98.7 F (37.1 C)  TempSrc: Oral  SpO2: 99%  Weight: 206 lb 14.4  oz (93.8 kg)  Height: 5\' 4"  (1.626 m)    Body mass index is 35.51 kg/m.  Physical Exam Vitals and nursing note reviewed.  Constitutional:      General: She is not in acute distress.    Appearance: She is obese. She is not ill-appearing, toxic-appearing or diaphoretic.     Comments: Appear tired and mildly ill, non-toxic, NAD  HENT:     Head: Normocephalic and atraumatic.     Right Ear: Tympanic membrane, ear canal and external ear normal. There is no impacted cerumen.     Left Ear: Tympanic membrane, ear canal and external ear normal. There is no impacted cerumen.     Nose: Nose normal. No congestion or rhinorrhea.     Mouth/Throat:     Mouth: Mucous membranes are moist.     Pharynx: Oropharynx is clear. Posterior oropharyngeal erythema present.  Eyes:     General: No scleral icterus.       Right eye: No discharge.        Left eye: No discharge.     Conjunctiva/sclera: Conjunctivae normal.  Cardiovascular:     Rate and Rhythm: Normal rate and regular rhythm.     Pulses: Normal pulses.     Heart sounds: Normal heart sounds.  Pulmonary:     Effort: Pulmonary effort is normal. No respiratory distress.     Breath sounds: Normal breath sounds. No stridor. No wheezing, rhonchi or rales.     Comments: Frequent coughing, no retractions/accessory muscle use Able to speak in full and complete sentences Musculoskeletal:     Cervical back: Normal range of motion.  Lymphadenopathy:     Cervical: No cervical adenopathy.  Skin:    General: Skin is warm and dry.     Coloration: Skin is not jaundiced or pale.     Findings: No rash.  Neurological:     Mental Status: She is alert. Mental status is at baseline.  Psychiatric:        Mood and Affect: Mood normal.        Behavior: Behavior normal.      Results for orders placed or performed in visit on 08/26/22  POCT rapid strep A  Result Value Ref Range   Rapid Strep A Screen Positive (A) Negative  POCT Influenza A/B  Result Value  Ref Range   Influenza A, POC Negative Negative   Influenza B, POC Negative Negative       Assessment & Plan:   1. Strep pharyngitis Positive strep - will start tx with amoxicillin - POCT rapid strep A - POCT Influenza A/B - amoxicillin (AMOXIL) 500 MG tablet; Take 1 tablet (500 mg total) by mouth 2 (two) times daily for 10 days.  Dispense: 20 tablet; Refill:  0  2. Febrile illness She has + strep and daughter with flu, however she is flu neg today - we discussed tamiflu tx with her hx of asthma and she wishes to not start that medication - she will keep [place] updated with any changes or worsening - ie not getting better on abx, or more sx consistent with flu - she can return for retesting or we can just start tamiflu since she is higher risk - POCT rapid strep A - POCT Influenza A/B  3. Exposure to influenza Daughter onset 2 d ago, pt sx started yesterday, flu neg - POCT Influenza A/B  4. Moderate persistent asthma with exacerbation Coughing seems very much triggered by swelling and congestion in throat, she has needed rescue inhaler only once and it did alleviate sx yesterday and she hasn't needed yet today, however very prone to have exacerbation  For now she will use rescue inhaler/symbicort and if asthma sx worsen she was encouraged to also start the steroid burst. Lungs w/o wheeze today, no increased work of breathing - predniSONE (DELTASONE) 20 MG tablet; Take 2 tablets (40 mg total) by mouth daily with breakfast for 5 days.  Dispense: 10 tablet; Refill: 0     Danelle Berry, PA-C 08/26/22 10:43 AM

## 2022-08-28 ENCOUNTER — Encounter: Payer: Self-pay | Admitting: Family Medicine

## 2022-08-30 ENCOUNTER — Telehealth (INDEPENDENT_AMBULATORY_CARE_PROVIDER_SITE_OTHER): Payer: 59 | Admitting: Family Medicine

## 2022-08-30 DIAGNOSIS — J4541 Moderate persistent asthma with (acute) exacerbation: Secondary | ICD-10-CM | POA: Diagnosis not present

## 2022-08-30 MED ORDER — PREDNISONE 20 MG PO TABS: ORAL_TABLET | ORAL | 0 refills | Status: DC

## 2022-08-30 MED ORDER — IPRATROPIUM-ALBUTEROL 0.5-2.5 (3) MG/3ML IN SOLN: 3.0000 mL | Freq: Three times a day (TID) | RESPIRATORY_TRACT | 1 refills | Status: DC | PRN

## 2022-08-30 MED ORDER — HYDROCOD POLI-CHLORPHE POLI ER 10-8 MG/5ML PO SUER: 2.5000 mL | Freq: Every evening | ORAL | 0 refills | Status: DC | PRN

## 2022-08-30 MED ORDER — PROMETHAZINE-DM 6.25-15 MG/5ML PO SYRP: 5.0000 mL | ORAL_SOLUTION | Freq: Four times a day (QID) | ORAL | 0 refills | Status: DC | PRN

## 2022-08-30 MED ORDER — NEBULIZER/TUBING/MOUTHPIECE KIT: PACK | 0 refills | Status: DC

## 2022-08-30 NOTE — Progress Notes (Signed)
Name: Loretta Jensen   MRN: [id number]    DOB: 1986-09-13   Date:08/30/2022       Progress Note  Subjective:    Chief Complaint  Chief Complaint  Patient presents with   Cough    Dry cough keeps her up at night   Follow-up    I connected with  Dolores Hoose on 08/30/22 at 11:40 AM EST by telephone and verified that I am speaking with the correct person using two identifiers.   I discussed the limitations, risks, security and privacy concerns of performing an evaluation and management service by telephone and the availability of in person appointments. Staff also discussed with the patient that there may be a patient responsible charge related to this service.  Patient verbalized understanding and agreed to proceed with encounter. Patient Location: [facility] [employer], [facility]: [facility] Additional Individuals present: none  Cough   Pt presented last week with pharyngitis and her daughter had flu She has hx of asthma - was given prednisone for if her asthma became exacerbated She sent a msg about worse coughing and presents today for f/up on her current sx  She is still on the steroids dry worse all night, hurts to cough - pain is in abd, severe dry coughing fits, despite steroids, tessalon perles and robitussin and inhalers     Patient Active Problem List   Diagnosis Date Noted   Reactive airway disease 03/17/2020   Chronic midline low back pain without sciatica 03/17/2020   Family history of breast cancer    Seasonal allergic rhinitis 01/19/2019   Family history of breast cancer in mother 01/19/2019   Menorrhagia with irregular cycle 01/19/2019   BMI 34.0-34.9,adult 03/04/2018   Migraine headache 01/23/2018   Occupational exposure to dust 05/23/2015    Social History   Tobacco Use   Smoking status: Never   Smokeless tobacco: Never  Substance Use Topics   Alcohol use: Yes    Alcohol/week: 0.0 standard drinks of alcohol    Comment: occasionally      Current Outpatient Medications:    albuterol (VENTOLIN HFA) 108 (90 Base) MCG/ACT inhaler, Inhale 2 puffs into the lungs every 4 (four) hours as needed for wheezing or shortness of breath., Disp: 2 each, Rfl: 2   amoxicillin (AMOXIL) 500 MG tablet, Take 1 tablet (500 mg total) by mouth 2 (two) times daily for 10 days., Disp: 20 tablet, Rfl: 0   benzonatate (TESSALON) 100 MG capsule, Take 1 capsule (100 mg total) by mouth 2 (two) times daily as needed for cough., Disp: 20 capsule, Rfl: 0   budesonide-formoterol (SYMBICORT) 160-4.5 MCG/ACT inhaler, Inhale 2 puffs into the lungs 2 (two) times daily., Disp: 3 each, Rfl: 3   cetirizine (ZYRTEC) 10 MG tablet, Take 1 tablet (10 mg total) by mouth daily., Disp: 30 tablet, Rfl: 11   famotidine (PEPCID) 20 MG tablet, Take 1 tablet (20 mg total) by mouth daily., Disp: 30 tablet, Rfl: 1   fluticasone (FLONASE) 50 MCG/ACT nasal spray, Place 2 sprays into both nostrils daily., Disp: 16 g, Rfl: 6   ibuprofen (ADVIL) 600 MG tablet, TAKE 1 TABLET BY MOUTH EVERY 8 HOURS AS NEEDED FOR HEADACHE, MILD PAIN, MODERATE PAIN, OR CRAMPING, Disp: 90 tablet, Rfl: 1   montelukast (SINGULAIR) 10 MG tablet, Take 1 tablet (10 mg total) by mouth at bedtime., Disp: 90 tablet, Rfl: 3   Olopatadine HCl 0.2 % SOLN, Use 1 drop in each eye BID, Disp: 2.5 mL, Rfl: 0  predniSONE (DELTASONE) 20 MG tablet, Take 2 tablets (40 mg total) by mouth daily with breakfast for 5 days., Disp: 10 tablet, Rfl: 0   sucralfate (CARAFATE) 1 g tablet, Take 1 tablet (1 g total) by mouth 4 (four) times daily., Disp: 60 tablet, Rfl: 0  No Known Allergies  Chart Review: I personally reviewed active problem list, medication list, allergies, family history, social history, health maintenance, notes from last encounter, lab results, imaging with the patient/caregiver today.   Review of Systems  Constitutional: Negative.   HENT: Negative.    Eyes: Negative.   Respiratory:  Positive for cough.    Cardiovascular: Negative.   Gastrointestinal: Negative.   Endocrine: Negative.   Genitourinary: Negative.   Musculoskeletal: Negative.   Skin: Negative.   Allergic/Immunologic: Negative.   Neurological: Negative.   Hematological: Negative.   Psychiatric/Behavioral: Negative.    All other systems reviewed and are negative.    Objective:    Virtual encounter, vitals limited, only able to obtain the following There were no vitals filed for this visit. There is no height or weight on file to calculate BMI. Nursing Note and Vital Signs reviewed.  Physical Exam Vitals and nursing note reviewed.  Pulmonary:     Comments: Able to speak in full sentences, no audible wheeze or stridor, multiple episodes of coughing fits Psychiatric:        Mood and Affect: Mood normal.     PE limited by telephone encounter  No results found for this or any previous visit (from the past 72 hour(s)).  Assessment and Plan:     ICD-10-CM   1. Moderate persistent asthma with exacerbation  J45.41 Respiratory Therapy Supplies (NEBULIZER/TUBING/MOUTHPIECE) KIT    predniSONE (DELTASONE) 20 MG tablet    chlorpheniramine-HYDROcodone (TUSSIONEX) 10-8 MG/5ML    promethazine-dextromethorphan (PROMETHAZINE-DM) 6.25-15 MG/5ML syrup    ipratropium-albuterol (DUONEB) 0.5-2.5 (3) MG/3ML SOLN     Patient presents for worsening cough and respiratory symptoms with history of moderate persistent asthma consistent with acute exacerbation.  She has not seen a significant improvement with prednisone, she has taken it for 4 days, over-the-counter cough syrups and the Tessalon Perles are not helpful, she is not using her inhalers very often -do believe that extending her steroids and doing a lot of nebulizer treatments will be helpful for her also sent in 2 different stronger or different cough medicines for her to try  We will be working to get her a nebulizer machine since hers is not currently working.  Encouraged  her to follow-up if there is no significant improvement in the next 3 to 5 days  -Red flags and when to present for emergency care or RTC including but not limited to new/worsening/un-resolving symptoms,  reviewed with patient at time of visit. Follow up and care instructions discussed and provided in AVS. - I discussed the assessment and treatment plan with the patient. The patient was provided an opportunity to ask questions and all were answered. The patient agreed with the plan and demonstrated an understanding of the instructions.  - The patient was advised to call back or seek an in-person evaluation if the symptoms worsen or if the condition fails to improve as anticipated.  I provided 23+ minutes of non-face-to-face time during this encounter.  Delsa Grana, PA-C 08/30/22 12:03 PM

## 2022-11-05 ENCOUNTER — Telehealth (INDEPENDENT_AMBULATORY_CARE_PROVIDER_SITE_OTHER): Payer: 59 | Admitting: Family Medicine

## 2022-11-05 DIAGNOSIS — J302 Other seasonal allergic rhinitis: Secondary | ICD-10-CM

## 2022-11-05 DIAGNOSIS — J4541 Moderate persistent asthma with (acute) exacerbation: Secondary | ICD-10-CM

## 2022-11-05 MED ORDER — MONTELUKAST SODIUM 10 MG PO TABS: 10.0000 mg | ORAL_TABLET | Freq: Every day | ORAL | 3 refills | Status: DC

## 2022-11-05 MED ORDER — CETIRIZINE HCL 10 MG PO TABS: 10.0000 mg | ORAL_TABLET | Freq: Every day | ORAL | 11 refills | Status: AC

## 2022-11-05 NOTE — Progress Notes (Signed)
Name: Loretta Jensen   MRN: [id number]    DOB: [birth year]   Date:11/05/2022       Progress Note  Subjective:    Chief Complaint  Chief Complaint  Patient presents with   Nasal Congestion   Sore Throat   Facial pressure    Sx onset for about a week     I connected with  Shalynn Luzader  on 11/05/22 at  2:20 PM EST by a video enabled telemedicine application and verified that I am speaking with the correct person using two identifiers.  I discussed the limitations of evaluation and management by telemedicine and the availability of in person appointments. The patient expressed understanding and agreed to proceed. Staff also discussed with the patient that there may be a patient responsible charge related to this service. Patient Location: outside of work on cell, no one around Provider Location: [facility] Additional Individuals present: none  HPI Sinus/nasal pain, pressure, congestion, drainage, hurts to the point where she "blows her nose" but doesn't blow anything Ears hurting/blocked  Slight HA, little sore throat, coughing off and on, sometimes productive white sputum  No body aches, no fever/sweats  Using her inhaler a little more with coughing, no CP/sob/DOE  Tried vicks for flu for coughing, using flonase and not on her singulair or an allergy med, she has not tried decongestants tylenol or ibuprofen       Patient Active Problem List   Diagnosis Date Noted   Reactive airway disease 03/17/2020   Chronic midline low back pain without sciatica 03/17/2020   Family history of breast cancer    Seasonal allergic rhinitis 01/19/2019   Family history of breast cancer in mother 01/19/2019   Menorrhagia with irregular cycle 01/19/2019   BMI 34.0-34.9,adult 03/04/2018   Migraine headache 01/23/2018   Occupational exposure to dust 05/23/2015    Social History   Tobacco Use   Smoking status: Never   Smokeless tobacco: Never  Substance Use Topics   Alcohol use:  Yes    Alcohol/week: 0.0 standard drinks of alcohol    Comment: occasionally     Current Outpatient Medications:    albuterol (VENTOLIN HFA) 108 (90 Base) MCG/ACT inhaler, Inhale 2 puffs into the lungs every 4 (four) hours as needed for wheezing or shortness of breath., Disp: 2 each, Rfl: 2   budesonide-formoterol (SYMBICORT) 160-4.5 MCG/ACT inhaler, Inhale 2 puffs into the lungs 2 (two) times daily., Disp: 3 each, Rfl: 3   cetirizine (ZYRTEC) 10 MG tablet, Take 1 tablet (10 mg total) by mouth daily., Disp: 30 tablet, Rfl: 11   chlorpheniramine-HYDROcodone (TUSSIONEX) 10-8 MG/5ML, Take 2.5-5 mLs by mouth at bedtime as needed for cough., Disp: 120 mL, Rfl: 0   famotidine (PEPCID) 20 MG tablet, Take 1 tablet (20 mg total) by mouth daily., Disp: 30 tablet, Rfl: 1   fluticasone (FLONASE) 50 MCG/ACT nasal spray, Place 2 sprays into both nostrils daily., Disp: 16 g, Rfl: 6   ibuprofen (ADVIL) 600 MG tablet, TAKE 1 TABLET BY MOUTH EVERY 8 HOURS AS NEEDED FOR HEADACHE, MILD PAIN, MODERATE PAIN, OR CRAMPING, Disp: 90 tablet, Rfl: 1   ipratropium-albuterol (DUONEB) 0.5-2.5 (3) MG/3ML SOLN, Take 3 mLs by nebulization 3 (three) times daily as needed., Disp: 360 mL, Rfl: 1   montelukast (SINGULAIR) 10 MG tablet, Take 1 tablet (10 mg total) by mouth at bedtime., Disp: 90 tablet, Rfl: 3   Olopatadine HCl 0.2 % SOLN, Use 1 drop in each eye BID, Disp: 2.5 mL, Rfl:  0   predniSONE (DELTASONE) 20 MG tablet, 2 tabs poqday 1-3, 1 tabs poqday 4-6, Disp: 9 tablet, Rfl: 0   promethazine-dextromethorphan (PROMETHAZINE-DM) 6.25-15 MG/5ML syrup, Take 5 mLs by mouth 4 (four) times daily as needed for cough., Disp: 118 mL, Rfl: 0   Respiratory Therapy Supplies (NEBULIZER/TUBING/MOUTHPIECE) KIT, Disp one nebulizer machine, tubing set and mouthpiece kit, Disp: 1 kit, Rfl: 0   sucralfate (CARAFATE) 1 g tablet, Take 1 tablet (1 g total) by mouth 4 (four) times daily., Disp: 60 tablet, Rfl: 0   benzonatate (TESSALON) 100 MG  capsule, Take 1 capsule (100 mg total) by mouth 2 (two) times daily as needed for cough. (Patient not taking: Reported on 11/05/2022), Disp: 20 capsule, Rfl: 0  No Known Allergies  I personally reviewed active problem list, medication list, allergies, family history, social history, health maintenance, notes from last encounter, lab results, imaging with the patient/caregiver today.   Review of Systems  Constitutional: Negative.   HENT: Negative.    Eyes: Negative.   Respiratory: Negative.    Cardiovascular: Negative.   Gastrointestinal: Negative.   Endocrine: Negative.   Genitourinary: Negative.   Musculoskeletal: Negative.   Skin: Negative.   Allergic/Immunologic: Negative.   Neurological: Negative.   Hematological: Negative.   Psychiatric/Behavioral: Negative.    All other systems reviewed and are negative.     Objective:   Virtual encounter, vitals limited, only able to obtain the following There were no vitals filed for this visit. There is no height or weight on file to calculate BMI. Nursing Note and Vital Signs reviewed.  Physical Exam Vitals reviewed.  Constitutional:      General: She is not in acute distress.    Appearance: Normal appearance. She is not ill-appearing, toxic-appearing or diaphoretic.  Pulmonary:     Effort: Pulmonary effort is normal.     Comments: No audible wheeze or stridor, no coughing during visit, speaking in full and complete sentences Neurological:     Mental Status: She is alert.     PE limited by virtual encounter  No results found for this or any previous visit (from the past 72 hour(s)).  Assessment and Plan:   Pt with hx of allergies and asthma presents with 1 week of URI sx  She is taking her allergy or asthma meds, but she has been using flonase and tried some cold/flu meds OTC Sx reported as congestion, stuffiness, slightly cough, but no fever, sweats, body aches, CP, SOB, wheeze May be viral illness vs uncontrolled  allergies/asthma  Encouraged her to restart allergy and asthma meds, supportive tx with cough meds, mucinex, increase inhalers as noted below No benefit to covid testing, unlikely to be flu Supportive and sx management     ICD-10-CM   1. Seasonal allergic rhinitis, unspecified trigger  J30.2 cetirizine (ZYRTEC) 10 MG tablet    montelukast (SINGULAIR) 10 MG tablet   refill in singulair and antihistamine OTC daily     2. Moderate persistent reactive airway disease with acute exacerbation  J45.41 montelukast (SINGULAIR) 10 MG tablet   uncontrolled disease, increase maintenence inhaler dose 2x daily and other allergy and asthma control meds    If respiratory sx still do not improve then pt was urged to let us know or f/up - would likely add steroid burst   -Red flags and when to present for emergency care or RTC including fever >101.82F, chest pain, shortness of breath, new/worsening/un-resolving symptoms, reviewed with patient at time of visit. Follow up and care  instructions discussed and provided in AVS. - I discussed the assessment and treatment plan with the patient. The patient was provided an opportunity to ask questions and all were answered. The patient agreed with the plan and demonstrated an understanding of the instructions.  I provided 15 minutes of non-face-to-face time during this encounter.  Delsa Grana, PA-C 11/05/22 2:50 PM

## 2022-11-06 ENCOUNTER — Telehealth: Payer: 59 | Admitting: Family Medicine

## 2022-12-02 ENCOUNTER — Other Ambulatory Visit: Payer: Self-pay

## 2022-12-02 DIAGNOSIS — J4541 Moderate persistent asthma with (acute) exacerbation: Secondary | ICD-10-CM

## 2022-12-02 MED ORDER — IPRATROPIUM-ALBUTEROL 0.5-2.5 (3) MG/3ML IN SOLN: 3.0000 mL | Freq: Three times a day (TID) | RESPIRATORY_TRACT | 1 refills | Status: AC | PRN

## 2023-01-10 ENCOUNTER — Encounter: Payer: Self-pay | Admitting: Family Medicine

## 2023-01-10 ENCOUNTER — Ambulatory Visit (INDEPENDENT_AMBULATORY_CARE_PROVIDER_SITE_OTHER): Payer: 59 | Admitting: Family Medicine

## 2023-01-10 VITALS — BP 120/82 | HR 80 | Temp 97.8°F | Resp 16 | Ht 64.0 in | Wt 208.7 lb

## 2023-01-10 DIAGNOSIS — J302 Other seasonal allergic rhinitis: Secondary | ICD-10-CM

## 2023-01-10 DIAGNOSIS — J329 Chronic sinusitis, unspecified: Secondary | ICD-10-CM | POA: Diagnosis not present

## 2023-01-10 DIAGNOSIS — J4541 Moderate persistent asthma with (acute) exacerbation: Secondary | ICD-10-CM | POA: Diagnosis not present

## 2023-01-10 MED ORDER — AMOXICILLIN-POT CLAVULANATE 875-125 MG PO TABS: 1.0000 | ORAL_TABLET | Freq: Two times a day (BID) | ORAL | 0 refills | Status: AC

## 2023-01-10 MED ORDER — PREDNISONE 20 MG PO TABS: ORAL_TABLET | ORAL | 0 refills | Status: DC

## 2023-01-10 MED ORDER — LEVOCETIRIZINE DIHYDROCHLORIDE 5 MG PO TABS: 5.0000 mg | ORAL_TABLET | Freq: Every evening | ORAL | 1 refills | Status: DC

## 2023-01-10 NOTE — Progress Notes (Unsigned)
Patient ID: Loretta Jensen, female    DOB: 28-Oct-1985, 37 y.o.   MRN: [id number]  PCP: Danelle Berry, PA-C  Chief Complaint  Patient presents with   Allergic Rhinitis     Subjective:   Loretta Jensen is a 37 y.o. female, presents to clinic with CC of the following:  HPI  Pt with hx of severea llergies and asthmna presents with worse sx uncontrolled with recent weather changes and pollen    Patient Active Problem List   Diagnosis Date Noted   Reactive airway disease 03/17/2020   Chronic midline low back pain without sciatica 03/17/2020   Family history of breast cancer    Seasonal allergic rhinitis 01/19/2019   Family history of breast cancer in mother 01/19/2019   Menorrhagia with irregular cycle 01/19/2019   BMI 34.0-34.9,adult 03/04/2018   Migraine headache 01/23/2018   Occupational exposure to dust 05/23/2015      Current Outpatient Medications:    albuterol (VENTOLIN HFA) 108 (90 Base) MCG/ACT inhaler, Inhale 2 puffs into the lungs every 4 (four) hours as needed for wheezing or shortness of breath., Disp: 2 each, Rfl: 2   budesonide-formoterol (SYMBICORT) 160-4.5 MCG/ACT inhaler, Inhale 2 puffs into the lungs 2 (two) times daily., Disp: 3 each, Rfl: 3   cetirizine (ZYRTEC) 10 MG tablet, Take 1 tablet (10 mg total) by mouth daily., Disp: 30 tablet, Rfl: 11   chlorpheniramine-HYDROcodone (TUSSIONEX) 10-8 MG/5ML, Take 2.5-5 mLs by mouth at bedtime as needed for cough., Disp: 120 mL, Rfl: 0   famotidine (PEPCID) 20 MG tablet, Take 1 tablet (20 mg total) by mouth daily., Disp: 30 tablet, Rfl: 1   fluticasone (FLONASE) 50 MCG/ACT nasal spray, Place 2 sprays into both nostrils daily., Disp: 16 g, Rfl: 6   ibuprofen (ADVIL) 600 MG tablet, TAKE 1 TABLET BY MOUTH EVERY 8 HOURS AS NEEDED FOR HEADACHE, MILD PAIN, MODERATE PAIN, OR CRAMPING, Disp: 90 tablet, Rfl: 1   ipratropium-albuterol (DUONEB) 0.5-2.5 (3) MG/3ML SOLN, Take 3 mLs by nebulization 3 (three) times daily as  needed., Disp: 360 mL, Rfl: 1   montelukast (SINGULAIR) 10 MG tablet, Take 1 tablet (10 mg total) by mouth at bedtime., Disp: 90 tablet, Rfl: 3   Olopatadine HCl 0.2 % SOLN, Use 1 drop in each eye BID, Disp: 2.5 mL, Rfl: 0   promethazine-dextromethorphan (PROMETHAZINE-DM) 6.25-15 MG/5ML syrup, Take 5 mLs by mouth 4 (four) times daily as needed for cough., Disp: 118 mL, Rfl: 0   Respiratory Therapy Supplies (NEBULIZER/TUBING/MOUTHPIECE) KIT, Disp one nebulizer machine, tubing set and mouthpiece kit, Disp: 1 kit, Rfl: 0   sucralfate (CARAFATE) 1 g tablet, Take 1 tablet (1 g total) by mouth 4 (four) times daily., Disp: 60 tablet, Rfl: 0   No Known Allergies   Social History   Tobacco Use   Smoking status: Never   Smokeless tobacco: Never  Vaping Use   Vaping Use: Never used  Substance Use Topics   Alcohol use: Yes    Alcohol/week: 0.0 standard drinks of alcohol    Comment: occasionally   Drug use: No      Chart Review Today: ***  Review of Systems     Objective:   There were no vitals filed for this visit.  There is no height or weight on file to calculate BMI.  Physical Exam   Results for orders placed or performed in visit on 08/26/22  POCT rapid strep A  Result Value Ref Range   Rapid Strep A Screen Positive (  A) Negative  POCT Influenza A/B  Result Value Ref Range   Influenza A, POC Negative Negative   Influenza B, POC Negative Negative       Assessment & Plan:   ***     Danelle Berry, PA-C 01/10/23 2:52 PM

## 2023-03-03 ENCOUNTER — Ambulatory Visit (INDEPENDENT_AMBULATORY_CARE_PROVIDER_SITE_OTHER): Payer: 59 | Admitting: Family Medicine

## 2023-03-03 ENCOUNTER — Ambulatory Visit: Payer: Self-pay | Admitting: *Deleted

## 2023-03-03 ENCOUNTER — Other Ambulatory Visit: Payer: Self-pay

## 2023-03-03 ENCOUNTER — Other Ambulatory Visit (HOSPITAL_COMMUNITY)
Admission: RE | Admit: 2023-03-03 | Discharge: 2023-03-03 | Disposition: A | Discharge status: Home / Self Care | Payer: 59 | Source: Ambulatory Visit | Attending: Family Medicine | Admitting: Family Medicine

## 2023-03-03 ENCOUNTER — Encounter: Payer: Self-pay | Admitting: Family Medicine

## 2023-03-03 VITALS — BP 128/82 | HR 98 | Temp 98.2°F | Resp 18 | Ht 64.0 in | Wt 214.7 lb

## 2023-03-03 DIAGNOSIS — N760 Acute vaginitis: Secondary | ICD-10-CM

## 2023-03-03 DIAGNOSIS — Z8349 Family history of other endocrine, nutritional and metabolic diseases: Secondary | ICD-10-CM | POA: Insufficient documentation

## 2023-03-03 DIAGNOSIS — R300 Dysuria: Secondary | ICD-10-CM | POA: Diagnosis not present

## 2023-03-03 DIAGNOSIS — R109 Unspecified abdominal pain: Secondary | ICD-10-CM | POA: Diagnosis not present

## 2023-03-03 DIAGNOSIS — N898 Other specified noninflammatory disorders of vagina: Secondary | ICD-10-CM | POA: Diagnosis present

## 2023-03-03 LAB — POCT URINALYSIS DIPSTICK
Bilirubin, UA: NEGATIVE
Glucose, UA: NEGATIVE
Ketones, UA: NEGATIVE
Nitrite, UA: NEGATIVE
Protein, UA: POSITIVE — AB
Spec Grav, UA: 1.025 (ref 1.010–1.025)
Urobilinogen, UA: 0.2 E.U./dL
pH, UA: 5 (ref 5.0–8.0)

## 2023-03-03 MED ORDER — FLUCONAZOLE 150 MG PO TABS: 150.0000 mg | ORAL_TABLET | ORAL | 0 refills | Status: DC | PRN

## 2023-03-03 NOTE — Telephone Encounter (Signed)
  Chief Complaint: abd pain, burning with urination, urine smells Symptoms: above Frequency: Since Sunday Pertinent Negatives: Patient denies vomiting/diarrhea Disposition: [] ED /[] Urgent Care (no appt availability in office) / [x] Appointment(In office/virtual)/ []  [facility]/ [] Home Care/ [] Refused Recommended Disposition /[] [facility]/ [x]  Follow-up with PCP Additional Notes: Pt. Has an appt today with Danelle Berry, PA-C encouraged her to keep this appt.

## 2023-03-03 NOTE — Progress Notes (Unsigned)
Patient ID: Loretta Jensen, female    DOB: 1986/03/28, 37 y.o.   MRN: [id number]  PCP: Danelle Berry, PA-C  Chief Complaint  Patient presents with   Urinary Tract Infection    Pain when urinating for 5 days   Vaginal Discharge    Subjective:   Loretta Jensen is a 37 y.o. female, presents to clinic with CC of the following:  HPI  Abd pain with some vaginal irritation onset 5 d ago- she used a pad of her daughters that had some medication on it and this irritated her vulvovaginal area even more.  after that she had burning and pain with urination.  No flank pain, n, V, bowel changes, fever sweats chills N/V Sexually active with one partner, s/p tubal, no pain with sex or exposure to STDs and no concerns  Patient Active Problem List   Diagnosis Date Noted   Reactive airway disease 03/17/2020   Chronic midline low back pain without sciatica 03/17/2020   Family history of breast cancer    Seasonal allergic rhinitis 01/19/2019   Family history of breast cancer in mother 01/19/2019   Menorrhagia with irregular cycle 01/19/2019   BMI 34.0-34.9,adult 03/04/2018   Migraine headache 01/23/2018   Occupational exposure to dust 05/23/2015      Current Outpatient Medications:    albuterol (VENTOLIN HFA) 108 (90 Base) MCG/ACT inhaler, Inhale 2 puffs into the lungs every 4 (four) hours as needed for wheezing or shortness of breath., Disp: 2 each, Rfl: 2   budesonide-formoterol (SYMBICORT) 160-4.5 MCG/ACT inhaler, Inhale 2 puffs into the lungs 2 (two) times daily., Disp: 3 each, Rfl: 3   cetirizine (ZYRTEC) 10 MG tablet, Take 1 tablet (10 mg total) by mouth daily., Disp: 30 tablet, Rfl: 11   famotidine (PEPCID) 20 MG tablet, Take 1 tablet (20 mg total) by mouth daily., Disp: 30 tablet, Rfl: 1   fluticasone (FLONASE) 50 MCG/ACT nasal spray, Place 2 sprays into both nostrils daily., Disp: 16 g, Rfl: 6   ibuprofen (ADVIL) 600 MG tablet, TAKE 1 TABLET BY MOUTH EVERY 8 HOURS AS NEEDED  FOR HEADACHE, MILD PAIN, MODERATE PAIN, OR CRAMPING, Disp: 90 tablet, Rfl: 1   ipratropium-albuterol (DUONEB) 0.5-2.5 (3) MG/3ML SOLN, Take 3 mLs by nebulization 3 (three) times daily as needed., Disp: 360 mL, Rfl: 1   levocetirizine (XYZAL) 5 MG tablet, Take 1 tablet (5 mg total) by mouth every evening., Disp: 90 tablet, Rfl: 1   montelukast (SINGULAIR) 10 MG tablet, Take 1 tablet (10 mg total) by mouth at bedtime., Disp: 90 tablet, Rfl: 3   Olopatadine HCl 0.2 % SOLN, Use 1 drop in each eye BID, Disp: 2.5 mL, Rfl: 0   sucralfate (CARAFATE) 1 g tablet, Take 1 tablet (1 g total) by mouth 4 (four) times daily., Disp: 60 tablet, Rfl: 0   chlorpheniramine-HYDROcodone (TUSSIONEX) 10-8 MG/5ML, Take 2.5-5 mLs by mouth at bedtime as needed for cough. (Patient not taking: Reported on 03/03/2023), Disp: 120 mL, Rfl: 0   predniSONE (DELTASONE) 20 MG tablet, 3 tabs poqday 1-3, 2 tabs poqday 4-6, 1 tab poqday 7-9, Disp: 18 tablet, Rfl: 0   promethazine-dextromethorphan (PROMETHAZINE-DM) 6.25-15 MG/5ML syrup, Take 5 mLs by mouth 4 (four) times daily as needed for cough. (Patient not taking: Reported on 03/03/2023), Disp: 118 mL, Rfl: 0   Respiratory Therapy Supplies (NEBULIZER/TUBING/MOUTHPIECE) KIT, Disp one nebulizer machine, tubing set and mouthpiece kit (Patient not taking: Reported on 03/03/2023), Disp: 1 kit, Rfl: 0   No Known Allergies  Social History   Tobacco Use   Smoking status: Never   Smokeless tobacco: Never  Vaping Use   Vaping Use: Never used  Substance Use Topics   Alcohol use: Yes    Alcohol/week: 0.0 standard drinks of alcohol    Comment: occasionally   Drug use: No      Chart Review Today: I personally reviewed active problem list, medication list, allergies, family history, social history, health maintenance, notes from last encounter, lab results, imaging with the patient/caregiver today.   Review of Systems  Constitutional: Negative.   HENT: Negative.    Eyes: Negative.    Respiratory: Negative.    Cardiovascular: Negative.   Gastrointestinal: Negative.   Endocrine: Negative.   Genitourinary: Negative.   Musculoskeletal: Negative.   Skin: Negative.   Allergic/Immunologic: Negative.   Neurological: Negative.   Hematological: Negative.   Psychiatric/Behavioral: Negative.    All other systems reviewed and are negative.      Objective:   Vitals:   03/03/23 1321  BP: 128/82  Pulse: 98  Resp: 18  Temp: 98.2 F (36.8 C)  TempSrc: Oral  SpO2: 98%  Weight: 214 lb 11.2 oz (97.4 kg)  Height: 5\' 4"  (1.626 m)    Body mass index is 36.85 kg/m.  Physical Exam Vitals and nursing note reviewed.  Constitutional:      General: She is not in acute distress.    Appearance: Normal appearance. She is well-developed. She is not ill-appearing, toxic-appearing or diaphoretic.  HENT:     Head: Normocephalic and atraumatic.     Nose: Nose normal.  Eyes:     General:        Right eye: No discharge.        Left eye: No discharge.     Conjunctiva/sclera: Conjunctivae normal.  Neck:     Trachea: No tracheal deviation.  Cardiovascular:     Rate and Rhythm: Normal rate and regular rhythm.     Pulses: Normal pulses.     Heart sounds: Normal heart sounds.  Pulmonary:     Effort: Pulmonary effort is normal. No respiratory distress.     Breath sounds: No stridor.  Abdominal:     General: Abdomen is flat. Bowel sounds are normal.     Palpations: Abdomen is soft.     Tenderness: There is no abdominal tenderness. There is no right CVA tenderness, left CVA tenderness, guarding or rebound. Negative signs include Murphy's sign and McBurney's sign.  Musculoskeletal:        General: Normal range of motion.  Skin:    General: Skin is warm and dry.     Findings: No rash.  Neurological:     Mental Status: She is alert.     Motor: No abnormal muscle tone.     Coordination: Coordination normal.  Psychiatric:        Behavior: Behavior normal.      Results for  orders placed or performed in visit on 03/03/23  POCT urinalysis dipstick  Result Value Ref Range   Color, UA gold    Clarity, UA cloudy    Glucose, UA Negative Negative   Bilirubin, UA negative    Ketones, UA negative    Spec Grav, UA 1.025 1.010 - 1.025   Blood, UA large    pH, UA 5.0 5.0 - 8.0   Protein, UA Positive (A) Negative   Urobilinogen, UA 0.2 0.2 or 1.0 E.U./dL   Nitrite, UA negative    Leukocytes, UA Small (1+) (A)  Negative   Appearance cloudy    Odor strong        Assessment & Plan:     ICD-10-CM   1. Vaginal discharge  N89.8 POCT urinalysis dipstick    Urine Culture    Cervicovaginal ancillary only    fluconazole (DIFLUCAN) 150 MG tablet   itching irritation, made worse with a medicated pad she wore, cytology vaginal swab pending, will send in diflucan and flagyl per results if needed    2. Abdominal pain, unspecified abdominal location  R10.9 POCT urinalysis dipstick    Urine Culture   abd exam unremarkable and benign today, no severe urinary sx, bowels normal, will wait for urine culture and cytology results    3. Acute vaginitis  N76.0 Cervicovaginal ancillary only    fluconazole (DIFLUCAN) 150 MG tablet   and vulvovaginitis - she can start diflucan and we will treat per results, no pain with sex, no vaginal bleeding, no chance of pregnancy    4. Dysuria  R30.0 POCT urinalysis dipstick    Urine Culture    Cervicovaginal ancillary only   sounds like pain is located to vulva/genitals and not suprapubic or urethral pain, ua unremarkable, pending urine culture    5. Family history of thyroid disease  Z81.49    multiple female family members          Danelle Berry, PA-C 03/03/23 1:30 PM

## 2023-03-03 NOTE — Telephone Encounter (Signed)
Reason for Disposition  [1] MODERATE pain (e.g., interferes with normal activities) AND [2] pain comes and goes (cramps) AND [3] present > 24 hours  (Exception: Pain with Vomiting or Diarrhea - see that Guideline.)    Has an appt for today at 1:20 with Danelle Berry, PA-C  Answer Assessment - Initial Assessment Questions 1. LOCATION: "Where does it hurt?"      My stomach started hurting Sunday night until right now.   The appt for today is for a UTI with Danelle Berry, PA-C.    My urine smells and hurts when I pee.   It burning.   I thought it was a period cramp but this feels worse.    2. RADIATION: "Does the pain shoot anywhere else?" (e.g., chest, back)     No 3. ONSET: "When did the pain begin?" (e.g., minutes, hours or days ago)      Sunday night until now.   Pt. Has an appt with Danelle Berry, PA-C for today at 1:20.   So I encouraged her to keep that appt. Which she was agreeable to doing.   4. SUDDEN: "Gradual or sudden onset?"     Not asked 5. PATTERN "Does the pain come and go, or is it constant?"    - If it comes and goes: "How long does it last?" "Do you have pain now?"     (Note: Comes and goes means the pain is intermittent. It goes away completely between bouts.)    - If constant: "Is it getting better, staying the same, or getting worse?"      (Note: Constant means the pain never goes away completely; most serious pain is constant and gets worse.)      Constant since Sunday 6. SEVERITY: "How bad is the pain?"  (e.g., Scale 1-10; mild, moderate, or severe)    - MILD (1-3): Doesn't interfere with normal activities, abdomen soft and not tender to touch.     - MODERATE (4-7): Interferes with normal activities or awakens from sleep, abdomen tender to touch.     - SEVERE (8-10): Excruciating pain, doubled over, unable to do any normal activities.       "It's been hurting bad since Sunday night until today. 7. RECURRENT SYMPTOM: "Have you ever had this type of stomach pain before?" If Yes,  ask: "When was the last time?" and "What happened that time?"      Not aksed 8. CAUSE: "What do you think is causing the stomach pain?"     I don't know 9. RELIEVING/AGGRAVATING FACTORS: "What makes it better or worse?" (e.g., antacids, bending or twisting motion, bowel movement)     Not asked 10. OTHER SYMPTOMS: "Do you have any other symptoms?" (e.g., back pain, diarrhea, fever, urination pain, vomiting)       Burning with urination, urine has a smell to it and having abd pain.    Denies diarrhea or vomiting. 11. PREGNANCY: "Is there any chance you are pregnant?" "When was your last menstrual period?"       Not asked  Protocols used: Abdominal Pain - [facility]

## 2023-03-04 LAB — URINE CULTURE
MICRO NUMBER:: 15062892
SPECIMEN QUALITY:: ADEQUATE

## 2023-03-05 LAB — CERVICOVAGINAL ANCILLARY ONLY
Bacterial Vaginitis (gardnerella): POSITIVE — AB
Candida Glabrata: NEGATIVE
Candida Vaginitis: POSITIVE — AB
Chlamydia: NEGATIVE
Comment: NEGATIVE
Comment: NEGATIVE
Comment: NEGATIVE
Comment: NEGATIVE
Comment: NEGATIVE
Comment: NORMAL
Neisseria Gonorrhea: NEGATIVE
Trichomonas: NEGATIVE

## 2023-03-06 ENCOUNTER — Other Ambulatory Visit: Payer: Self-pay | Admitting: Family Medicine

## 2023-03-06 DIAGNOSIS — B9689 Other specified bacterial agents as the cause of diseases classified elsewhere: Secondary | ICD-10-CM

## 2023-03-06 MED ORDER — METRONIDAZOLE 500 MG PO TABS: 500.0000 mg | ORAL_TABLET | Freq: Three times a day (TID) | ORAL | 0 refills | Status: AC

## 2023-03-07 NOTE — Patient Instructions (Signed)

## 2023-03-10 ENCOUNTER — Encounter: Payer: Self-pay | Admitting: Family Medicine

## 2023-03-10 ENCOUNTER — Ambulatory Visit (INDEPENDENT_AMBULATORY_CARE_PROVIDER_SITE_OTHER): Payer: 59 | Admitting: Family Medicine

## 2023-03-10 VITALS — BP 104/68 | HR 97 | Temp 98.0°F | Resp 16 | Ht 64.0 in | Wt 210.6 lb

## 2023-03-10 DIAGNOSIS — Z8349 Family history of other endocrine, nutritional and metabolic diseases: Secondary | ICD-10-CM

## 2023-03-10 DIAGNOSIS — D500 Iron deficiency anemia secondary to blood loss (chronic): Secondary | ICD-10-CM | POA: Diagnosis not present

## 2023-03-10 DIAGNOSIS — Z0000 Encounter for general adult medical examination without abnormal findings: Secondary | ICD-10-CM | POA: Diagnosis not present

## 2023-03-10 DIAGNOSIS — N920 Excessive and frequent menstruation with regular cycle: Secondary | ICD-10-CM | POA: Diagnosis not present

## 2023-03-10 DIAGNOSIS — Z803 Family history of malignant neoplasm of breast: Secondary | ICD-10-CM

## 2023-03-10 NOTE — Progress Notes (Signed)
Patient: Loretta Jensen, Female    DOB: 1986-02-18, 37 y.o.   MRN: [id number] Danelle Berry, PA-C Visit Date: 03/10/2023  Today's Provider: Danelle Berry, PA-C   Chief Complaint  Patient presents with   Annual Exam   Subjective:   Annual physical exam:  Loretta Jensen is a 37 y.o. female who presents today for complete physical exam:  Exercise/Activity:  5 d 40 min walking for an hour Diet/nutrition: no specific efforts right now  Sleep:  no concerns - third shift   SDOH Screenings   Food Insecurity: No Food Insecurity (03/10/2023)  Housing: Low Risk  (03/10/2023)  Transportation Needs: No Transportation Needs (03/10/2023)  Utilities: Not At Risk (03/10/2023)  Alcohol Screen: Low Risk  (03/03/2023)  Depression (PHQ2-9): Low Risk  (03/10/2023)  Financial Resource Strain: Low Risk  (03/10/2023)  Physical Activity: Sufficiently Active (03/10/2023)  Social Connections: Moderately Isolated (03/10/2023)  Stress: Stress Concern Present (03/10/2023)  Tobacco Use: Low Risk  (03/10/2023)     USPSTF grade A and B recommendations - reviewed and addressed today  Depression:  Phq 9 completed today by patient, was reviewed by me with patient in the room PHQ score is neg, pt feels good    03/10/2023    9:25 AM 03/03/2023    1:16 PM 01/10/2023    2:48 PM 11/05/2022    1:14 PM  PHQ 2/9 Scores  PHQ - 2 Score 0 0 0 0  PHQ- 9 Score 0  0 0      03/10/2023    9:25 AM 03/03/2023    1:16 PM 01/10/2023    2:48 PM 11/05/2022    1:14 PM 08/30/2022   11:29 AM  Depression screen PHQ 2/9  Decreased Interest 0 0 0 0 0  Down, Depressed, Hopeless 0 0 0 0 0  PHQ - 2 Score 0 0 0 0 0  Altered sleeping 0  0 0 0  Tired, decreased energy 0  0 0 0  Change in appetite 0  0 0 0  Feeling bad or failure about yourself  0  0 0 0  Trouble concentrating 0  0 0 0  Moving slowly or fidgety/restless 0  0 0 0  Suicidal thoughts 0  0 0 0  PHQ-9 Score 0  0 0 0  Difficult doing work/chores Not difficult at all   Not difficult at all Not difficult at all Not difficult at all    Alcohol screening: Flowsheet Row Office Visit from 03/03/20[facility] in [facility]  AUDIT-C Score 0       Immunizations and Health Maintenance: Health Maintenance  Topic Date Due   DTaP/Tdap/Td (1 - Tdap) Never done   COVID-19 Vaccine (3 - 2023-24 season) 03/23/2023 (Originally 05/24/2022)   INFLUENZA VACCINE  04/24/2023   PAP SMEAR-Modifier  01/02/2027   Hepatitis C Screening  Completed   HIV Screening  Completed   HPV VACCINES  Aged Out     Hep C Screening: done  STD testing and prevention (HIV/chl/gon/syphilis):  see above, no additional testing desired by pt today  Intimate partner violence: safe   Sexual History/Pain during Intercourse: no pain with sex, tubal, one female partner   Menstrual History/LMP/Abnormal Bleeding: regular cycles monthly  30 d, lasts 1 weeks, very heavy the first 4 d, mad cramping 1 week before and 1 week after, back pain Patient's last menstrual period was 02/14/2023.  Incontinence Symptoms: no incontinence   Breast cancer: mother and multiple family members with BCA  Last Mammogram: *see HM list above BRCA gene screening:   Cervical cancer screening: UTD  Osteoporosis:   Discussion on osteoporosis per age, including high calcium and vitamin D supplementation, weight bearing exercises Not discussed due to age and risk factors   Skin cancer:  Hx of skin CA -  NO Discussed atypical lesions   Colorectal cancer:   Colonoscopy is not due per age   Discussed concerning signs and sx of CRC, pt denies blood in stool/change in bowels   Lung cancer:   Low Dose CT Chest recommended if Age 71-80 years, 20 pack-year currently smoking OR have quit w/in 15years. Patient does not qualify.    Social History   Tobacco Use   Smoking status: Never   Smokeless tobacco: Never  Vaping Use   Vaping Use: Never used  Substance Use Topics   Alcohol use: Yes     Alcohol/week: 0.0 standard drinks of alcohol    Comment: occasionally   Drug use: No     Flowsheet Row Office Visit from 03/03/2023 in [facility]  AUDIT-C Score 0       Family History  Problem Relation Age of Onset   Cancer Mother    Breast cancer Mother 53   Cancer Maternal Grandfather        2018 - liver   Breast cancer Other 56       2018   Breast cancer Other 55       2013   Breast cancer Other        1990s   Cancer Paternal Grandmother        unk type   Breast cancer Other      Blood pressure/Hypertension: BP Readings from Last 3 Encounters:  03/10/23 104/68  03/03/23 128/82  01/10/23 120/82    Weight/Obesity: Wt Readings from Last 3 Encounters:  03/10/23 210 lb 9.6 oz (95.5 kg)  03/03/23 214 lb 11.2 oz (97.4 kg)  01/10/23 208 lb 11.2 oz (94.7 kg)   BMI Readings from Last 3 Encounters:  03/10/23 36.15 kg/m  03/03/23 36.85 kg/m  01/10/23 35.82 kg/m     Lipids:  Lab Results  Component Value Date   CHOL 121 01/01/2022   CHOL 113 04/23/2019   CHOL 131 06/30/2015   Lab Results  Component Value Date   HDL 53 01/01/2022   HDL 47 (L) 04/23/2019   HDL 57 06/30/2015   Lab Results  Component Value Date   LDLCALC 54 01/01/2022   LDLCALC 52 04/23/2019   LDLCALC 63 06/30/2015   Lab Results  Component Value Date   TRIG 63 01/01/2022   TRIG 68 04/23/2019   TRIG 55 06/30/2015   Lab Results  Component Value Date   CHOLHDL 2.3 01/01/2022   CHOLHDL 2.4 04/23/2019   CHOLHDL 2.3 06/30/2015   No results found for: "LDLDIRECT" Based on the results of lipid panel his/her cardiovascular risk factor ( using Poole Cohort )  in the next 10 years is: The ASCVD Risk score (Arnett DK, et al., 2019) failed to calculate for the following reasons:   The 2019 ASCVD risk score is only valid for ages 78 to 38  Glucose:  Glucose, Bld  Date Value Ref Range Status  06/27/2022 102 (H) 70 - 99 mg/dL Final    Comment:    Glucose reference  range applies only to samples taken after fasting for at least 8 hours.  01/01/2022 90 65 - 99 mg/dL Final    Comment:    .  Fasting reference interval .   04/27/2021 100 (H) 70 - 99 mg/dL Final    Comment:    Glucose reference range applies only to samples taken after fasting for at least 8 hours.    Advanced Care Planning:  A voluntary discussion about advance care planning including the explanation and discussion of advance directives.   Discussed health care proxy and Living will, and the patient was able to identify a health care proxy as justin or aunt teresa.   Patient does not have a living will at present time.   Social History       Social History   Socioeconomic History   Marital status: Single    Spouse name: Not on file   Number of children: 3   Years of education: 13   Highest education level: Not on file  Occupational History   Occupation: laminate and crush foam    Comment: [employer]  Tobacco Use   Smoking status: Never   Smokeless tobacco: Never  Vaping Use   Vaping Use: Never used  Substance and Sexual Activity   Alcohol use: Yes    Alcohol/week: 0.0 standard drinks of alcohol    Comment: occasionally   Drug use: No   Sexual activity: Yes    Partners: Male    Birth control/protection: Surgical  Other Topics Concern   Not on file  Social History Narrative   Not on file   Social Determinants of Health   Financial Resource Strain: Low Risk  (03/10/2023)   Overall Financial Resource Strain (CARDIA)    Difficulty of Paying Living Expenses: Not hard at all  Food Insecurity: No Food Insecurity (03/10/2023)   Hunger Vital Sign    Worried About Running Out of Food in the Last Year: Never true    Ran Out of Food in the Last Year: Never true  Transportation Needs: No Transportation Needs (03/10/2023)   PRAPARE - Administrator, Civil Service (Medical): No    Lack of Transportation (Non-Medical): No  Physical Activity: Sufficiently  Active (03/10/2023)   Exercise Vital Sign    Days of Exercise per Week: 5 days    Minutes of Exercise per Session: 40 min  Stress: Stress Concern Present (03/10/2023)   [employer] of Occupational Health - Occupational Stress Questionnaire    Feeling of Stress : Rather much  Social Connections: Moderately Isolated (03/10/2023)   Social Connection and Isolation Panel [NHANES]    Frequency of Communication with Friends and Family: Twice a week    Frequency of Social Gatherings with Friends and Family: Twice a week    Attends Religious Services: 1 to 4 times per year    Active Member of [employer] or Organizations: No    Attends Banker Meetings: Never    Marital Status: Never married    Family History        Family History  Problem Relation Age of Onset   Cancer Mother    Breast cancer Mother 79   Cancer Maternal Grandfather        2018 - liver   Breast cancer Other 66       2018   Breast cancer Other 55       2013   Breast cancer Other        1990s   Cancer Paternal Grandmother        unk type   Breast cancer Other     Patient Active Problem List   Diagnosis Date Noted  Family history of thyroid disease 03/03/2023   Reactive airway disease 03/17/2020   Chronic midline low back pain without sciatica 03/17/2020   Family history of breast cancer    Seasonal allergic rhinitis 01/19/2019   Family history of breast cancer in mother 01/19/2019   Menorrhagia with irregular cycle 01/19/2019   BMI 34.0-34.9,adult 03/04/2018   Migraine headache 01/23/2018   Occupational exposure to dust 05/23/2015    Past Surgical History:  Procedure Laterality Date   CESAREAN SECTION  2009   CESAREAN SECTION N/A 03/02/2017   Procedure: CESAREAN SECTION;  Surgeon: Conard Novak, MD;  Location: [facility] ORS;  Service: Obstetrics;  Laterality: N/A;   CESAREAN SECTION N/A 07/10/2018   Procedure: CESAREAN SECTION;  Surgeon: Conard Novak, MD;  Location: [facility] ORS;  Service:  Obstetrics;  Laterality: N/A;   TUBAL LIGATION Bilateral 07/10/2018   Procedure: BILATERAL TUBAL LIGATION;  Surgeon: Conard Novak, MD;  Location: [facility] ORS;  Service: Obstetrics;  Laterality: Bilateral;     Current Outpatient Medications:    albuterol (VENTOLIN HFA) 108 (90 Base) MCG/ACT inhaler, Inhale 2 puffs into the lungs every 4 (four) hours as needed for wheezing or shortness of breath., Disp: 2 each, Rfl: 2   budesonide-formoterol (SYMBICORT) 160-4.5 MCG/ACT inhaler, Inhale 2 puffs into the lungs 2 (two) times daily., Disp: 3 each, Rfl: 3   cetirizine (ZYRTEC) 10 MG tablet, Take 1 tablet (10 mg total) by mouth daily., Disp: 30 tablet, Rfl: 11   famotidine (PEPCID) 20 MG tablet, Take 1 tablet (20 mg total) by mouth daily., Disp: 30 tablet, Rfl: 1   fluconazole (DIFLUCAN) 150 MG tablet, Take 1 tablet (150 mg total) by mouth every 3 (three) days as needed (for vaginal itching/yeast infection sx)., Disp: 3 tablet, Rfl: 0   fluticasone (FLONASE) 50 MCG/ACT nasal spray, Place 2 sprays into both nostrils daily., Disp: 16 g, Rfl: 6   ibuprofen (ADVIL) 600 MG tablet, TAKE 1 TABLET BY MOUTH EVERY 8 HOURS AS NEEDED FOR HEADACHE, MILD PAIN, MODERATE PAIN, OR CRAMPING, Disp: 90 tablet, Rfl: 1   ipratropium-albuterol (DUONEB) 0.5-2.5 (3) MG/3ML SOLN, Take 3 mLs by nebulization 3 (three) times daily as needed., Disp: 360 mL, Rfl: 1   levocetirizine (XYZAL) 5 MG tablet, Take 1 tablet (5 mg total) by mouth every evening., Disp: 90 tablet, Rfl: 1   metroNIDAZOLE (FLAGYL) 500 MG tablet, Take 1 tablet (500 mg total) by mouth 3 (three) times daily for 7 days., Disp: 21 tablet, Rfl: 0   montelukast (SINGULAIR) 10 MG tablet, Take 1 tablet (10 mg total) by mouth at bedtime., Disp: 90 tablet, Rfl: 3   Olopatadine HCl 0.2 % SOLN, Use 1 drop in each eye BID, Disp: 2.5 mL, Rfl: 0   sucralfate (CARAFATE) 1 g tablet, Take 1 tablet (1 g total) by mouth 4 (four) times daily., Disp: 60 tablet, Rfl: 0  No Known  Allergies  Patient Care Team: Danelle Berry, PA-C as PCP - General (Family Medicine)   Chart Review: I personally reviewed active problem list, medication list, allergies, family history, social history, health maintenance, notes from last encounter, lab results, imaging with the patient/caregiver today.   Review of Systems  Constitutional: Negative.   HENT: Negative.    Eyes: Negative.   Respiratory: Negative.    Cardiovascular: Negative.   Gastrointestinal: Negative.   Endocrine: Negative.   Genitourinary: Negative.   Musculoskeletal: Negative.   Skin: Negative.   Allergic/Immunologic: Negative.   Neurological: Negative.   Hematological: Negative.  Psychiatric/Behavioral: Negative.    All other systems reviewed and are negative.         Objective:   Vitals:  Vitals:   03/10/23 0929  BP: 104/68  Pulse: 97  Resp: 16  Temp: 98 F (36.7 C)  TempSrc: Oral  SpO2: 99%  Weight: 210 lb 9.6 oz (95.5 kg)  Height: 5\' 4"  (1.626 m)    Body mass index is 36.15 kg/m.  Physical Exam Vitals and nursing note reviewed.  Constitutional:      General: She is not in acute distress.    Appearance: Normal appearance. She is well-developed and well-groomed. She is obese. She is not ill-appearing, toxic-appearing or diaphoretic.  HENT:     Head: Normocephalic and atraumatic.     Right Ear: External ear normal.     Left Ear: External ear normal.     Nose: Nose normal.  Eyes:     General:        Right eye: No discharge.        Left eye: No discharge.     Conjunctiva/sclera: Conjunctivae normal.  Neck:     Trachea: No tracheal deviation.  Cardiovascular:     Rate and Rhythm: Normal rate and regular rhythm.     Pulses: Normal pulses.     Heart sounds: Normal heart sounds. No murmur heard.    No friction rub. No gallop.  Pulmonary:     Effort: Pulmonary effort is normal. No respiratory distress.     Breath sounds: Normal breath sounds. No stridor. No wheezing, rhonchi or  rales.  Abdominal:     General: Bowel sounds are normal. There is no distension.     Palpations: Abdomen is soft.     Tenderness: There is no abdominal tenderness.  Musculoskeletal:        General: Normal range of motion.  Skin:    General: Skin is warm and dry.     Findings: No rash.  Neurological:     Mental Status: She is alert. Mental status is at baseline.     Motor: No abnormal muscle tone.     Coordination: Coordination normal.     Gait: Gait normal.  Psychiatric:        Mood and Affect: Mood normal.        Behavior: Behavior normal. Behavior is cooperative.       Fall Risk:    03/10/2023    9:25 AM 03/03/2023    1:15 PM 01/10/2023    2:48 PM 11/05/2022    1:13 PM 08/30/2022   11:28 AM  Fall Risk   Falls in the past year? 0 0 0 0 0  Number falls in past yr: 0 0 0 0 0  Injury with Fall? 0 0 0 0 0  Risk for fall due to : No Fall Risks  No Fall Risks No Fall Risks No Fall Risks  Follow up Falls prevention discussed;Education provided;Falls evaluation completed  Falls prevention discussed;Education provided;Falls evaluation completed Falls prevention discussed;Education provided;Falls evaluation completed Education provided;Falls evaluation completed;Falls prevention discussed    Functional Status Survey: Is the patient deaf or have difficulty hearing?: No Does the patient have difficulty seeing, even when wearing glasses/contacts?: No Does the patient have difficulty concentrating, remembering, or making decisions?: No Does the patient have difficulty walking or climbing stairs?: No Does the patient have difficulty dressing or bathing?: No Does the patient have difficulty doing errands alone such as visiting a doctor's office or shopping?: No   Assessment &  Plan:    CPE completed today  USPSTF grade A and B recommendations reviewed with patient; age-appropriate recommendations, preventive care, screening tests, etc discussed and encouraged; healthy living  encouraged; see AVS for patient education given to patient  Discussed importance of 150 minutes of physical activity weekly, AHA exercise recommendations given to pt in AVS/handout  Discussed importance of healthy diet:  eating lean meats and proteins, avoiding trans fats and saturated fats, avoid simple sugars and excessive carbs in diet, eat 6 servings of fruit/vegetables daily and drink plenty of water and avoid sweet beverages.    Recommended pt to do annual eye exam and routine dental exams/cleanings  Depression, alcohol, fall screening completed as documented above and per flowsheets  Advance Care planning information and packet discussed and offered today, encouraged pt to discuss with family members/spouse/partner/friends and complete Advanced directive packet and bring copy to office   Reviewed Health Maintenance: Health Maintenance  Topic Date Due   DTaP/Tdap/Td (1 - Tdap) Never done   COVID-19 Vaccine (3 - 2023-24 season) 03/23/2023 (Originally 05/24/2022)   INFLUENZA VACCINE  04/24/2023   PAP SMEAR-Modifier  01/02/2027   Hepatitis C Screening  Completed   HIV Screening  Completed   HPV VACCINES  Aged Out    Immunizations: Immunization History  Administered Date(s) Administered   Influenza,inj,Quad PF,6+ Mos 09/01/2015, 07/09/2019   Influenza-Unspecified 07/10/2017, 06/17/2018, 07/09/2019, 06/13/2020, 06/20/2021, 06/13/2022   Moderna Sars-Covid-2 Vaccination 03/22/2020, 05/19/2020   Rho (D) Immune Globulin 12/13/2016   Vaccines:  HPV: up to at age 78 , ask insurance if age between 75-45  Shingrix: 81-64 yo and ask insurance if covered when patient above 69 yo Pneumonia: recommended with asthma -  educated and discussed with patient. Flu: out of season- educated and discussed with patient. Tdap - is up to date pt is trying to get a copy of the record for [place]    ICD-10-CM   1. Annual physical exam  Z00.00 COMPLETE METABOLIC PANEL WITH GFR    CBC with  Differential/Platelet    Lipid panel    Hemoglobin A1c    TSH    Iron, TIBC and Ferritin Panel    2. Family history of thyroid disease  Z83.49 TSH    3. Menorrhagia with regular cycle  N92.0 CBC with Differential/Platelet    Iron, TIBC and Ferritin Panel   heavy and regular, causing anemia, will see if there is any safe way to manage (continuous OCP?) need to review chart and hx/past GYN    4. Iron deficiency anemia due to chronic blood loss  D50.0    recheck labs Hemoglobin  Date Value Ref Range Status  06/27/2022 9.5 (L) 12.0 - 15.0 g/dL Final  04/54/0981 9.8 (L) 11.7 - 15.5 g/dL Final  19/14/7829 56.2 (L) 12.0 - 15.0 g/dL Final  13/04/6577 46.9 11.7 - 15.5 g/dL Final  62/95/2841 32.4 (L) 11.1 - 15.9 g/dL Final  40/06/2724 36.6 11.1 - 15.9 g/dL Final  44/11/4740 59.5 11.1 - 15.9 g/dL Final  63/87/5643 32.9 g/dL Final  51/88/4166 06.3 11.1 - 15.9 g/dL Final   Reviewed chart as much as I could - pt and prior notes only state pt was told previously by Dr. Jean Rosenthal not to do OCP "due to blood clots" - pt states this referred to large clots during menses - she has not hx of DVT/TVE She had tried OCP in the past for birth control and she took it irregularly, she also previously used [facility] patch and liked it but  states she thinks it was recalled and that's why she had to stop use She has mild hx of HA's - she reports no hx of migraines and no problem in the past with HA and OCP She has never tried continuous OCP for menorrhagia/chronic blood loss anemia Prior PCP did address this and instructed pt to f/up with Dr. Jean Rosenthal to discuss workup and tx for heavy menses Pt further has no hx of HTN, DVT/PE, stroke/heart attack, she has normal pap, past normal mammogram but does have strong family hx of BCA  S/p tubal, sexually active, recent neg cervical ca screening      5. Family history of breast cancer in mother  Z69.3 Ambulatory referral to Genetics   she did mammogram, prior genetics consult  - will need to see if there is indicated different screening with family hx, I reached out to genetics I cannot find genetic testing results in chart - I have messaged Lacy Duverney about pt and need for f/up?          Danelle Berry, PA-C 03/10/23 9:52 AM  [facility]

## 2023-03-11 ENCOUNTER — Other Ambulatory Visit: Payer: Self-pay | Admitting: Nurse Practitioner

## 2023-03-11 ENCOUNTER — Telehealth: Payer: Self-pay | Admitting: Family Medicine

## 2023-03-11 DIAGNOSIS — Z803 Family history of malignant neoplasm of breast: Secondary | ICD-10-CM

## 2023-03-11 DIAGNOSIS — M545 Low back pain, unspecified: Secondary | ICD-10-CM

## 2023-03-11 LAB — IRON,TIBC AND FERRITIN PANEL
%SAT: 8 % (calc) — ABNORMAL LOW (ref 16–45)
Ferritin: 12 ng/mL — ABNORMAL LOW (ref 16–154)
Iron: 30 ug/dL — ABNORMAL LOW (ref 40–190)
TIBC: 370 mcg/dL (calc) (ref 250–450)

## 2023-03-11 LAB — LIPID PANEL
Cholesterol: 130 mg/dL (ref <200)
HDL: 69 mg/dL (ref >=50)
LDL Cholesterol (Calc): 47 mg/dL (calc)
Non-HDL Cholesterol (Calc): 61 mg/dL (calc) (ref <130)
Total CHOL/HDL Ratio: 1.9 (calc) (ref <5.0)
Triglycerides: 61 mg/dL (ref <150)

## 2023-03-11 LAB — CBC WITH DIFFERENTIAL/PLATELET
Absolute Monocytes: 621 cells/uL (ref 200–950)
Basophils Absolute: 29 cells/uL (ref 0–200)
Basophils Relative: 0.3 %
Eosinophils Absolute: 68 cells/uL (ref 15–500)
Eosinophils Relative: 0.7 %
HCT: 41.3 % (ref 35.0–45.0)
Hemoglobin: 13.4 g/dL (ref 11.7–15.5)
Lymphs Abs: 1969 cells/uL (ref 850–3900)
MCH: 25.5 pg — ABNORMAL LOW (ref 27.0–33.0)
MCHC: 32.4 g/dL (ref 32.0–36.0)
MCV: 78.7 fL — ABNORMAL LOW (ref 80.0–100.0)
MPV: 9.4 fL (ref 7.5–12.5)
Monocytes Relative: 6.4 %
Neutro Abs: 7013 cells/uL (ref 1500–7800)
Neutrophils Relative %: 72.3 %
Platelets: 364 10*3/uL (ref 140–400)
RBC: 5.25 10*6/uL — ABNORMAL HIGH (ref 3.80–5.10)
RDW: 15.4 % — ABNORMAL HIGH (ref 11.0–15.0)
Total Lymphocyte: 20.3 %
WBC: 9.7 10*3/uL (ref 3.8–10.8)

## 2023-03-11 LAB — COMPLETE METABOLIC PANEL WITH GFR
AG Ratio: 1.3 (calc) (ref 1.0–2.5)
ALT: 16 U/L (ref 6–29)
AST: 13 U/L (ref 10–30)
Albumin: 4.2 g/dL (ref 3.6–5.1)
Alkaline phosphatase (APISO): 62 U/L (ref 31–125)
BUN: 11 mg/dL (ref 7–25)
CO2: 26 mmol/L (ref 20–32)
Calcium: 9.1 mg/dL (ref 8.6–10.2)
Chloride: 104 mmol/L (ref 98–110)
Creat: 0.7 mg/dL (ref 0.50–0.97)
Globulin: 3.2 g/dL (calc) (ref 1.9–3.7)
Glucose, Bld: 84 mg/dL (ref 65–99)
Potassium: 4 mmol/L (ref 3.5–5.3)
Sodium: 137 mmol/L (ref 135–146)
Total Bilirubin: 0.3 mg/dL (ref 0.2–1.2)
Total Protein: 7.4 g/dL (ref 6.1–8.1)
eGFR: 116 mL/min/{1.73_m2} (ref >=60)

## 2023-03-11 LAB — HEMOGLOBIN A1C
Hgb A1c MFr Bld: 5.8 % of total Hgb — ABNORMAL HIGH (ref <5.7)
Mean Plasma Glucose: 120 mg/dL
eAG (mmol/L): 6.6 mmol/L

## 2023-03-11 LAB — TSH: TSH: 3.17 mIU/L

## 2023-03-11 NOTE — Telephone Encounter (Signed)
Requested Prescriptions  Pending Prescriptions Disp Refills   ibuprofen (ADVIL) 600 MG tablet [Pharmacy Med Name: IBUPROFEN 600MG  TABLETS] 90 tablet 0    Sig: TAKE 1 TABLET BY MOUTH EVERY 8 HOURS AS NEEDED FOR HEADACHE, MILD TO MODERATE PAIN OR CRAMPING     Analgesics:  NSAIDS Failed - 03/11/2023  1:44 AM      Failed - Manual Review: Labs are only required if the patient has taken medication for more than 8 weeks.      Passed - Cr in normal range and within 360 days    Creat  Date Value Ref Range Status  03/10/2023 0.70 0.50 - 0.97 mg/dL Final         Passed - HGB in normal range and within 360 days    Hemoglobin  Date Value Ref Range Status  03/10/2023 13.4 11.7 - 15.5 g/dL Final  16/06/9603 54.0 (L) 11.1 - 15.9 g/dL Final  98/07/9146 82.9 g/dL Final         Passed - PLT in normal range and within 360 days    Platelets  Date Value Ref Range Status  03/10/2023 364 140 - 400 Thousand/uL Final  04/30/2018 252 150 - 450 x10E3/uL Final  07/30/2016 300 K/L Final         Passed - HCT in normal range and within 360 days    HCT  Date Value Ref Range Status  03/10/2023 41.3 35.0 - 45.0 % Final  07/30/2016 36 % Final   Hematocrit  Date Value Ref Range Status  04/30/2018 32.5 (L) 34.0 - 46.6 % Final         Passed - eGFR is 30 or above and within 360 days    GFR, Est African American  Date Value Ref Range Status  04/23/2019 136 > OR = 60 mL/min/1.74m2 Final   GFR, Est Non African American  Date Value Ref Range Status  04/23/2019 117 > OR = 60 mL/min/1.10m2 Final   GFR, Estimated  Date Value Ref Range Status  06/27/2022 >60 >60 mL/min Final    Comment:    (NOTE) Calculated using the CKD-EPI Creatinine Equation (2021)    eGFR  Date Value Ref Range Status  03/10/2023 116 > OR = 60 mL/min/1.10m2 Final         Passed - Patient is not pregnant      Passed - Valid encounter within last 12 months    Recent Outpatient Visits           Yesterday Annual physical exam    [facility] Danelle Berry, PA-C   1 week ago Vaginal discharge   [facility] Danelle Berry, PA-C   2 months ago Seasonal allergic rhinitis, unspecified trigger   [facility] Danelle Berry, PA-C   4 months ago Seasonal allergic rhinitis, unspecified trigger   [facility] Danelle Berry, PA-C   6 months ago Moderate persistent asthma with exacerbation   [facility] Danelle Berry, PA-C       Future Appointments             In 6 months Danelle Berry, PA-C [facility]

## 2023-03-11 NOTE — Telephone Encounter (Signed)
-----   Message from Minerva Areola sent at 03/11/2023  9:16 AM EDT ----- Regarding: RE: prior consult Hi Justis Closser!  Happy to see her again - I did talk with her virtually back in 2020. At that time we were having saliva kits mailed to patients instead of having them come in for blood draw. She never sent the saliva kit in so you are correct, no testing has been completed for her. I would want to see her again to review everything since it's been 4 years but we can definitely test her!  Thanks! Colin Mulders  ----- Message ----- From: Danelle Berry, PA-C Sent: 03/10/2023   5:22 PM EDT To: Minerva Areola Subject: prior consult                                  Andrena Mews - you previously did a consult with Ms. Riveron and I cannot find if she ever completed the genetic testing? I put in a new referral before I found the old consult - so sorry about that.  She did a mammogram at age 37 but with the strong family hx I was trying to get more info and make sure I am doing all the right screening tests for her (breast MRI? Mammo annually etc - last mammo said neg and do next screening at age 81)  Anyway,  Just wondering if she looks like she needed to fup with you or still complete testing?  I couldn't find results from your May 2020 consult.  Thanks [employer]

## 2023-03-12 NOTE — Telephone Encounter (Signed)
Pt notified- verbalized understanding.

## 2023-03-13 ENCOUNTER — Ambulatory Visit
Admission: EM | Admit: 2023-03-13 | Discharge: 2023-03-13 | Disposition: A | Discharge status: Home / Self Care | Payer: 59 | Attending: Emergency Medicine | Admitting: Emergency Medicine

## 2023-03-13 ENCOUNTER — Ambulatory Visit (INDEPENDENT_AMBULATORY_CARE_PROVIDER_SITE_OTHER): Payer: 59

## 2023-03-13 ENCOUNTER — Encounter: Payer: Self-pay | Admitting: Emergency Medicine

## 2023-03-13 ENCOUNTER — Encounter: Payer: Self-pay | Admitting: Family Medicine

## 2023-03-13 DIAGNOSIS — J069 Acute upper respiratory infection, unspecified: Secondary | ICD-10-CM | POA: Insufficient documentation

## 2023-03-13 DIAGNOSIS — G43909 Migraine, unspecified, not intractable, without status migrainosus: Secondary | ICD-10-CM | POA: Insufficient documentation

## 2023-03-13 DIAGNOSIS — E669 Obesity, unspecified: Secondary | ICD-10-CM | POA: Diagnosis not present

## 2023-03-13 DIAGNOSIS — B9789 Other viral agents as the cause of diseases classified elsewhere: Secondary | ICD-10-CM | POA: Diagnosis not present

## 2023-03-13 DIAGNOSIS — R059 Cough, unspecified: Secondary | ICD-10-CM | POA: Insufficient documentation

## 2023-03-13 DIAGNOSIS — Z8616 Personal history of COVID-19: Secondary | ICD-10-CM | POA: Diagnosis not present

## 2023-03-13 DIAGNOSIS — Z1152 Encounter for screening for COVID-19: Secondary | ICD-10-CM | POA: Insufficient documentation

## 2023-03-13 DIAGNOSIS — R0602 Shortness of breath: Secondary | ICD-10-CM | POA: Insufficient documentation

## 2023-03-13 DIAGNOSIS — Z6834 Body mass index (BMI) 34.0-34.9, adult: Secondary | ICD-10-CM | POA: Insufficient documentation

## 2023-03-13 DIAGNOSIS — T887XXA Unspecified adverse effect of drug or medicament, initial encounter: Secondary | ICD-10-CM | POA: Diagnosis not present

## 2023-03-13 DIAGNOSIS — R112 Nausea with vomiting, unspecified: Secondary | ICD-10-CM

## 2023-03-13 LAB — SARS CORONAVIRUS 2 BY RT PCR: SARS Coronavirus 2 by RT PCR: NEGATIVE

## 2023-03-13 MED ORDER — BENZONATATE 100 MG PO CAPS: 200.0000 mg | ORAL_CAPSULE | Freq: Three times a day (TID) | ORAL | 0 refills | Status: DC

## 2023-03-13 MED ORDER — ONDANSETRON 4 MG PO TBDP: 4.0000 mg | ORAL_TABLET | Freq: Three times a day (TID) | ORAL | 0 refills | Status: DC | PRN

## 2023-03-13 MED ORDER — IPRATROPIUM BROMIDE 0.06 % NA SOLN: 2.0000 | Freq: Four times a day (QID) | NASAL | 12 refills | Status: DC

## 2023-03-13 MED ORDER — ONDANSETRON 8 MG PO TBDP: 8.0000 mg | ORAL_TABLET | Freq: Three times a day (TID) | ORAL | 0 refills | Status: DC | PRN

## 2023-03-13 MED ORDER — PROMETHAZINE-DM 6.25-15 MG/5ML PO SYRP: 5.0000 mL | ORAL_SOLUTION | Freq: Four times a day (QID) | ORAL | 0 refills | Status: DC | PRN

## 2023-03-13 NOTE — ED Provider Notes (Signed)
[facility]    CSN: [id number] Arrival date & time: 03/13/23  1215      History   Chief Complaint Chief Complaint  Patient presents with   Headache   Nausea    Chills    Shortness of Breath    HPI Loretta Jensen is a 37 y.o. female.   HPI  37 year old female with past medical history significant for RAD, obesity, and migraine headaches presents for evaluation of nausea, headache, bitter taste in her mouth, chills, shortness of breath, and vomiting.  Her shortness of breath and cough began 2 days ago and they have been intermittent.  Her cough is nonproductive and is not associate with any wheezing.  Patient has some nasal congestion that developed today but she denies any nasal discharge.  She also denies fever.  She was prescribed Flagyl for treatment of BV on 03/06/2023 that she began taking on 03/08/2023.  Her last dose of medication was last night.  She called her PCP and was advised to come to the urgent care and to stop the Flagyl.  She denies any facial swelling, swelling of lips or tongue, or rashes.  Past Medical History:  Diagnosis Date   Allergy    COVID 10/05/2020   Family history of breast cancer    Headache    History of cesarean delivery 12/04/2016   Obesity affecting pregnancy 03/04/2018   Rh negative state in antepartum period 07/09/2018   Short interval between pregnancies affecting pregnancy, antepartum 01/19/2018   Status post cesarean delivery 03/02/2017   Type O blood, Rh negative     Patient Active Problem List   Diagnosis Date Noted   Family history of thyroid disease 03/03/2023   Reactive airway disease 03/17/2020   Chronic midline low back pain without sciatica 03/17/2020   Family history of breast cancer    Seasonal allergic rhinitis 01/19/2019   Family history of breast cancer in mother 01/19/2019   Menorrhagia with irregular cycle 01/19/2019   BMI 34.0-34.9,adult 03/04/2018   Migraine headache 01/23/2018   Occupational exposure  to dust 05/23/2015    Past Surgical History:  Procedure Laterality Date   CESAREAN SECTION  2009   CESAREAN SECTION N/A 03/02/2017   Procedure: CESAREAN SECTION;  Surgeon: Conard Novak, MD;  Location: [facility];  Service: Obstetrics;  Laterality: N/A;   CESAREAN SECTION N/A 07/10/2018   Procedure: CESAREAN SECTION;  Surgeon: Conard Novak, MD;  Location: [facility];  Service: Obstetrics;  Laterality: N/A;   TUBAL LIGATION Bilateral 07/10/2018   Procedure: BILATERAL TUBAL LIGATION;  Surgeon: Conard Novak, MD;  Location: [facility];  Service: Obstetrics;  Laterality: Bilateral;    OB History     Gravida  3   Para  3   Term  3   Preterm      AB      Living  3      SAB      IAB      Ectopic      Multiple  0   Live Births  3            Home Medications    Prior to Admission medications   Medication Sig Start Date End Date Taking? Authorizing Provider  benzonatate (TESSALON) 100 MG capsule Take 2 capsules (200 mg total) by mouth every 8 (eight) hours. 03/13/23  Yes Becky Augusta, NP  ipratropium (ATROVENT) 0.06 % nasal spray Place 2 sprays into both nostrils 4 (four) times daily. 03/13/23  Yes Becky Augusta, NP  ondansetron (ZOFRAN-ODT) 8 MG disintegrating tablet Take 1 tablet (8 mg total) by mouth every 8 (eight) hours as needed for nausea or vomiting. 03/13/23  Yes Becky Augusta, NP  promethazine-dextromethorphan (PROMETHAZINE-DM) 6.25-15 MG/5ML syrup Take 5 mLs by mouth 4 (four) times daily as needed. 03/13/23  Yes Becky Augusta, NP  albuterol (VENTOLIN HFA) 108 (90 Base) MCG/ACT inhaler Inhale 2 puffs into the lungs every 4 (four) hours as needed for wheezing or shortness of breath. 04/05/22   Danelle Berry, PA-C  budesonide-formoterol (SYMBICORT) 160-4.5 MCG/ACT inhaler Inhale 2 puffs into the lungs 2 (two) times daily. 08/05/22   Danelle Berry, PA-C  cetirizine (ZYRTEC) 10 MG tablet Take 1 tablet (10 mg total) by mouth daily. 11/05/22   Danelle Berry, PA-C   famotidine (PEPCID) 20 MG tablet Take 1 tablet (20 mg total) by mouth daily. 06/27/22 06/27/23  Phineas Semen, MD  fluconazole (DIFLUCAN) 150 MG tablet Take 1 tablet (150 mg total) by mouth every 3 (three) days as needed (for vaginal itching/yeast infection sx). 03/03/23   Danelle Berry, PA-C  fluticasone (FLONASE) 50 MCG/ACT nasal spray Place 2 sprays into both nostrils daily. 01/19/19   Doren Custard, FNP  ibuprofen (ADVIL) 600 MG tablet TAKE 1 TABLET BY MOUTH EVERY 8 HOURS AS NEEDED FOR HEADACHE, MILD TO MODERATE PAIN OR CRAMPING 03/11/23   Danelle Berry, PA-C  ipratropium-albuterol (DUONEB) 0.5-2.5 (3) MG/3ML SOLN Take 3 mLs by nebulization 3 (three) times daily as needed. 12/02/22   Danelle Berry, PA-C  levocetirizine (XYZAL) 5 MG tablet Take 1 tablet (5 mg total) by mouth every evening. 01/10/23   Danelle Berry, PA-C  metroNIDAZOLE (FLAGYL) 500 MG tablet Take 1 tablet (500 mg total) by mouth 3 (three) times daily for 7 days. 03/06/23 03/13/23  Danelle Berry, PA-C  montelukast (SINGULAIR) 10 MG tablet Take 1 tablet (10 mg total) by mouth at bedtime. 11/05/22   Danelle Berry, PA-C  Olopatadine HCl 0.2 % SOLN Use 1 drop in each eye BID 01/18/21   Margaretann Loveless, PA-C  sucralfate (CARAFATE) 1 g tablet Take 1 tablet (1 g total) by mouth 4 (four) times daily. 06/27/22   Phineas Semen, MD    Family History Family History  Problem Relation Age of Onset   Cancer Mother    Breast cancer Mother 104   Cancer Maternal Grandfather        2018 - liver   Breast cancer Other 75       2018   Breast cancer Other 55       2013   Breast cancer Other        1990s   Cancer Paternal Grandmother        unk type   Breast cancer Other     Social History Social History   Tobacco Use   Smoking status: Never   Smokeless tobacco: Never  Vaping Use   Vaping Use: Never used  Substance Use Topics   Alcohol use: Yes    Alcohol/week: 0.0 standard drinks of alcohol    Comment: occasionally   Drug use: No      Allergies   Patient has no known allergies.   Review of Systems Review of Systems  Constitutional:  Negative for fever.  HENT:  Positive for congestion. Negative for facial swelling, rhinorrhea, sore throat and trouble swallowing.   Respiratory:  Positive for cough and shortness of breath. Negative for wheezing.   Gastrointestinal:  Positive for nausea and vomiting.  Physical Exam Triage Vital Signs ED Triage Vitals  Enc Vitals Group     BP      Pulse      Resp      Temp      Temp src      SpO2      Weight      Height      Head Circumference      Peak Flow      Pain Score      Pain Loc      Pain Edu?      Excl. in GC?    No data found.  Updated Vital Signs BP 112/79 (BP Location: Right Arm)   Pulse 82   Temp 98.3 F (36.8 C) (Oral)   Resp 16   LMP 02/14/2023   SpO2 98%   Visual Acuity Right Eye Distance:   Left Eye Distance:   Bilateral Distance:    Right Eye Near:   Left Eye Near:    Bilateral Near:     Physical Exam Vitals and nursing note reviewed.  Constitutional:      Appearance: Normal appearance. She is not ill-appearing.  HENT:     Head: Normocephalic and atraumatic.     Right Ear: Tympanic membrane, ear canal and external ear normal. There is no impacted cerumen.     Left Ear: Tympanic membrane, ear canal and external ear normal. There is no impacted cerumen.     Nose: Congestion and rhinorrhea present.     Comments: His mucosa is erythematous and edematous with clear discharge in both nares.    Mouth/Throat:     Mouth: Mucous membranes are moist.     Pharynx: Oropharynx is clear. Posterior oropharyngeal erythema present. No oropharyngeal exudate.     Comments: Mild erythema to the posterior oropharynx with clear postnasal drip. Cardiovascular:     Rate and Rhythm: Normal rate and regular rhythm.     Pulses: Normal pulses.     Heart sounds: Normal heart sounds. No murmur heard.    No friction rub. No gallop.  Pulmonary:      Effort: Pulmonary effort is normal.     Breath sounds: Normal breath sounds. No wheezing, rhonchi or rales.  Musculoskeletal:     Cervical back: Normal range of motion and neck supple.  Lymphadenopathy:     Cervical: No cervical adenopathy.  Skin:    General: Skin is warm and dry.     Capillary Refill: Capillary refill takes less than 2 seconds.     Findings: No rash.  Neurological:     General: No focal deficit present.     Mental Status: She is alert and oriented to person, place, and time.  Psychiatric:        Mood and Affect: Mood normal.        Behavior: Behavior normal.        Thought Content: Thought content normal.        Judgment: Judgment normal.      UC Treatments / Results  Labs (all labs ordered are listed, but only abnormal results are displayed) Labs Reviewed  SARS CORONAVIRUS 2 BY RT PCR    EKG   Radiology DG Chest 2 View  Result Date: 03/13/2023 CLINICAL DATA:  Intermittent cough.  Shortness of breath EXAM: CHEST - 2 VIEW COMPARISON:  Chest x-ray 06/27/2022 and older FINDINGS: The heart size and mediastinal contours are within normal limits. Both lungs are clear. No consolidation, pneumothorax or effusion. No  edema. The visualized skeletal structures are unremarkable. IMPRESSION: No acute cardiopulmonary disease. Electronically Signed   By: Karen Kays M.D.   On: 03/13/2023 12:51    Procedures Procedures (including critical care time)  Medications Ordered in UC Medications - No data to display  Initial Impression / Assessment and Plan / UC Course  I have reviewed the triage vital signs and the nursing notes.  Pertinent labs & imaging results that were available during my care of the patient were reviewed by me and considered in my medical decision making (see chart for details).   Patient is a nontoxic-appearing 37 year old female presenting for evaluation of respiratory and GI symptoms that started 2 days ago as outlined in the HPI above.  She was  prescribed Flagyl for the treatment of BV and she is concerned that she might be having allergic reaction because she has had some associated shortness of breath and chills.  She is also complaining of nausea, headache, and a bitter taste in her mouth.  The last 3 symptoms all may be related to the metronidazole but the chills, cough, and shortness of breath are not typical allergic reaction symptoms.  Patient also has no facial swelling, swelling of her lips or tongue, or rashes.  Her physical exam does reveal inflammation of her upper respiratory tract in the way of nasal mucosal inflammation with clear rhinorrhea and clear postnasal drip.  Her lungs are clear to auscultation all fields.  I suspect that the patient has a viral illness that is causing her symptoms.  I will order a COVID PCR and also a chest x-ray given that patient has had a cough, though it has been nonproductive.  Radiology impression of chest x-ray states no active cardiopulmonary disease.  COVID swab is negative.  I believe that part of the patient's symptoms are secondary to the Flagyl but her cough and shortness of breath are secondary to a viral URI.  She does not have any wheezing on exam but I will discharge her home with an albuterol inhaler that she can use as needed for shortness of breath and cough along with Atrovent nasal spray, Tessalon Perles, and Promethazine DM cough syrup.  She has already stopped the metronidazole and I will have her follow-up with her PCP if she has a return of her BV symptoms..  Work note provided.  Final Clinical Impressions(s) / UC Diagnoses   Final diagnoses:  Viral URI with cough  Medication side effect     Discharge Instructions      Your chest x-ray did not show any evidence of pneumonia and your COVID swab was negative.  Your physical exam does reveal inflammation of your upper respiratory tract and I do believe you have a viral respiratory infection which is contributing to majority  of your symptoms.  I do believe some of your symptoms, such as the headache, nausea, and bitter taste in her mouth are secondary to the Flagyl you are taking.  If you have a return of BV symptoms I recommend that you follow-up with your primary care provider.  Use your albuterol inhaler that has been prescribed every 4-6 hours as needed for any shortness of breath or cough.  Use the Atrovent nasal spray, 2 squirts in each nostril every 6 hours, as needed for runny nose and postnasal drip.  Use the Tessalon Perles every 8 hours during the day.  Take them with a small sip of water.  They may give you some numbness to the base  of your tongue or a metallic taste in your mouth, this is normal.  Use the Promethazine DM cough syrup at bedtime for cough and congestion.  It will make you drowsy so do not take it during the day.  You may use the Zofran every 8 hours as needed for nausea.  This should resolve since you have stopped taking the Flagyl.  Return for reevaluation or see your primary care provider for any new or worsening symptoms.      ED Prescriptions     Medication Sig Dispense Auth. Provider   benzonatate (TESSALON) 100 MG capsule Take 2 capsules (200 mg total) by mouth every 8 (eight) hours. 21 capsule Becky Augusta, NP   ipratropium (ATROVENT) 0.06 % nasal spray Place 2 sprays into both nostrils 4 (four) times daily. 15 mL Becky Augusta, NP   promethazine-dextromethorphan (PROMETHAZINE-DM) 6.25-15 MG/5ML syrup Take 5 mLs by mouth 4 (four) times daily as needed. 118 mL Becky Augusta, NP   ondansetron (ZOFRAN-ODT) 8 MG disintegrating tablet Take 1 tablet (8 mg total) by mouth every 8 (eight) hours as needed for nausea or vomiting. 20 tablet Becky Augusta, NP      PDMP not reviewed this encounter.   Becky Augusta, NP 03/13/23 1314

## 2023-03-13 NOTE — ED Triage Notes (Signed)
Pt was prescribed metronidazole on 6/15 for BV. Since taking the medication she has nausea, headache, bitter taste in her mouth and SOB at times. The last time the medication was taken was last night.

## 2023-03-13 NOTE — Discharge Instructions (Signed)
Your chest x-ray did not show any evidence of pneumonia and your COVID swab was negative.  Your physical exam does reveal inflammation of your upper respiratory tract and I do believe you have a viral respiratory infection which is contributing to majority of your symptoms.  I do believe some of your symptoms, such as the headache, nausea, and bitter taste in her mouth are secondary to the Flagyl you are taking.  If you have a return of BV symptoms I recommend that you follow-up with your primary care provider.  Use your albuterol inhaler that has been prescribed every 4-6 hours as needed for any shortness of breath or cough.  Use the Atrovent nasal spray, 2 squirts in each nostril every 6 hours, as needed for runny nose and postnasal drip.  Use the Tessalon Perles every 8 hours during the day.  Take them with a small sip of water.  They may give you some numbness to the base of your tongue or a metallic taste in your mouth, this is normal.  Use the Promethazine DM cough syrup at bedtime for cough and congestion.  It will make you drowsy so do not take it during the day.  You may use the Zofran every 8 hours as needed for nausea.  This should resolve since you have stopped taking the Flagyl.  Return for reevaluation or see your primary care provider for any new or worsening symptoms.

## 2023-03-14 ENCOUNTER — Encounter: Payer: Self-pay | Admitting: Family Medicine

## 2023-03-14 ENCOUNTER — Ambulatory Visit (INDEPENDENT_AMBULATORY_CARE_PROVIDER_SITE_OTHER): Payer: 59 | Admitting: Family Medicine

## 2023-03-14 VITALS — BP 110/76 | HR 82 | Temp 98.0°F | Resp 16 | Ht 64.0 in | Wt 209.1 lb

## 2023-03-14 DIAGNOSIS — J4541 Moderate persistent asthma with (acute) exacerbation: Secondary | ICD-10-CM | POA: Diagnosis not present

## 2023-03-14 DIAGNOSIS — Z0289 Encounter for other administrative examinations: Secondary | ICD-10-CM | POA: Diagnosis not present

## 2023-03-14 DIAGNOSIS — T50905A Adverse effect of unspecified drugs, medicaments and biological substances, initial encounter: Secondary | ICD-10-CM | POA: Diagnosis not present

## 2023-03-14 MED ORDER — TRELEGY ELLIPTA 100-62.5-25 MCG/ACT IN AEPB
1.0000 | INHALATION_SPRAY | Freq: Every day | RESPIRATORY_TRACT | 3 refills | Status: DC
Start: 2023-03-14 — End: 2023-03-14

## 2023-03-14 MED ORDER — TRELEGY ELLIPTA 100-62.5-25 MCG/ACT IN AEPB
1.0000 | INHALATION_SPRAY | Freq: Every day | RESPIRATORY_TRACT | 3 refills | Status: AC
Start: 2023-03-14 — End: ?

## 2023-03-14 NOTE — Progress Notes (Unsigned)
Patient ID: Loretta Jensen, female    DOB: 1986/04/11, 37 y.o.   MRN: [id number]  PCP: Danelle Berry, PA-C  Chief Complaint  Patient presents with   Form Completion    FMLA    Subjective:   Loretta Jensen is a 37 y.o. female, presents to clinic with CC of the following:  HPI  N/V with flagyl, she stopped medication and took a zofran and she is feeling better Completed all meds and sx resolved  Here for FMLA for asthma - recertification More uncontrolled recenty, she reports being off trelegy - maintenance inhaler - states she didn't get refills from pharmacy some time ago - however we didn't get the refill request and we not aware of her being off of it Mulitple exacerbations - with spring, heat/humidity/weather Currently her sx are well controlled In the past 6 months she's had 6 exacerbations FMLA paperwork filled out again    Patient Active Problem List   Diagnosis Date Noted   Family history of thyroid disease 03/03/2023   Reactive airway disease 03/17/2020   Chronic midline low back pain without sciatica 03/17/2020   Family history of breast cancer    Seasonal allergic rhinitis 01/19/2019   Family history of breast cancer in mother 01/19/2019   Menorrhagia with irregular cycle 01/19/2019   BMI 34.0-34.9,adult 03/04/2018   Migraine headache 01/23/2018   Occupational exposure to dust 05/23/2015      Current Outpatient Medications:    albuterol (VENTOLIN HFA) 108 (90 Base) MCG/ACT inhaler, Inhale 2 puffs into the lungs every 4 (four) hours as needed for wheezing or shortness of breath., Disp: 2 each, Rfl: 2   benzonatate (TESSALON) 100 MG capsule, Take 2 capsules (200 mg total) by mouth every 8 (eight) hours., Disp: 21 capsule, Rfl: 0   budesonide-formoterol (SYMBICORT) 160-4.5 MCG/ACT inhaler, Inhale 2 puffs into the lungs 2 (two) times daily., Disp: 3 each, Rfl: 3   cetirizine (ZYRTEC) 10 MG tablet, Take 1 tablet (10 mg total) by mouth daily., Disp: 30  tablet, Rfl: 11   famotidine (PEPCID) 20 MG tablet, Take 1 tablet (20 mg total) by mouth daily., Disp: 30 tablet, Rfl: 1   fluconazole (DIFLUCAN) 150 MG tablet, Take 1 tablet (150 mg total) by mouth every 3 (three) days as needed (for vaginal itching/yeast infection sx)., Disp: 3 tablet, Rfl: 0   fluticasone (FLONASE) 50 MCG/ACT nasal spray, Place 2 sprays into both nostrils daily., Disp: 16 g, Rfl: 6   ibuprofen (ADVIL) 600 MG tablet, TAKE 1 TABLET BY MOUTH EVERY 8 HOURS AS NEEDED FOR HEADACHE, MILD TO MODERATE PAIN OR CRAMPING, Disp: 90 tablet, Rfl: 0   ipratropium (ATROVENT) 0.06 % nasal spray, Place 2 sprays into both nostrils 4 (four) times daily., Disp: 15 mL, Rfl: 12   ipratropium-albuterol (DUONEB) 0.5-2.5 (3) MG/3ML SOLN, Take 3 mLs by nebulization 3 (three) times daily as needed., Disp: 360 mL, Rfl: 1   levocetirizine (XYZAL) 5 MG tablet, Take 1 tablet (5 mg total) by mouth every evening., Disp: 90 tablet, Rfl: 1   montelukast (SINGULAIR) 10 MG tablet, Take 1 tablet (10 mg total) by mouth at bedtime., Disp: 90 tablet, Rfl: 3   Olopatadine HCl 0.2 % SOLN, Use 1 drop in each eye BID, Disp: 2.5 mL, Rfl: 0   ondansetron (ZOFRAN-ODT) 8 MG disintegrating tablet, Take 1 tablet (8 mg total) by mouth every 8 (eight) hours as needed for nausea or vomiting., Disp: 20 tablet, Rfl: 0   promethazine-dextromethorphan (PROMETHAZINE-DM) 6.25-15 MG/5ML  syrup, Take 5 mLs by mouth 4 (four) times daily as needed., Disp: 118 mL, Rfl: 0   sucralfate (CARAFATE) 1 g tablet, Take 1 tablet (1 g total) by mouth 4 (four) times daily., Disp: 60 tablet, Rfl: 0   No Known Allergies   Social History   Tobacco Use   Smoking status: Never   Smokeless tobacco: Never  Vaping Use   Vaping Use: Never used  Substance Use Topics   Alcohol use: Yes    Alcohol/week: 0.0 standard drinks of alcohol    Comment: occasionally   Drug use: No      Chart Review Today: I personally reviewed active problem list, medication  list, allergies, family history, social history, health maintenance, notes from last encounter, lab results, imaging with the patient/caregiver today.   Review of Systems  Constitutional: Negative.   HENT: Negative.    Eyes: Negative.   Respiratory: Negative.    Cardiovascular: Negative.   Gastrointestinal: Negative.   Endocrine: Negative.   Genitourinary: Negative.   Musculoskeletal: Negative.   Skin: Negative.   Allergic/Immunologic: Negative.   Neurological: Negative.   Hematological: Negative.   Psychiatric/Behavioral: Negative.    All other systems reviewed and are negative.      Objective:   Vitals:   03/14/23 1052  BP: 110/76  Pulse: 82  Resp: 16  Temp: 98 F (36.7 C)  TempSrc: Oral  SpO2: 99%  Weight: 209 lb 1.6 oz (94.8 kg)  Height: 5\' 4"  (1.626 m)    Body mass index is 35.89 kg/m.  Physical Exam Vitals and nursing note reviewed.  Constitutional:      General: She is not in acute distress.    Appearance: Normal appearance. She is well-developed. She is obese. She is not ill-appearing, toxic-appearing or diaphoretic.  HENT:     Head: Normocephalic and atraumatic.     Right Ear: External ear normal.     Left Ear: External ear normal.     Mouth/Throat:     Mouth: Mucous membranes are moist.     Pharynx: Oropharynx is clear. No oropharyngeal exudate or posterior oropharyngeal erythema.  Eyes:     General:        Right eye: No discharge.        Left eye: No discharge.     Conjunctiva/sclera: Conjunctivae normal.  Neck:     Trachea: No tracheal deviation.  Cardiovascular:     Rate and Rhythm: Normal rate and regular rhythm.     Pulses: Normal pulses.     Heart sounds: Normal heart sounds.  Pulmonary:     Effort: Pulmonary effort is normal. No respiratory distress.     Breath sounds: Normal breath sounds. No stridor. No wheezing, rhonchi or rales.  Abdominal:     General: Bowel sounds are normal.     Palpations: Abdomen is soft.  Musculoskeletal:         General: Normal range of motion.  Skin:    General: Skin is warm and dry.     Findings: No rash.  Neurological:     Mental Status: She is alert.     Motor: No abnormal muscle tone.     Coordination: Coordination normal.  Psychiatric:        Behavior: Behavior normal.      Results for orders placed or performed during the hospital encounter of 03/13/23  SARS Coronavirus 2 by RT PCR (hospital order, performed in [facility] hospital lab) *cepheid single result test* Anterior Nasal Swab  Specimen: Anterior Nasal Swab  Result Value Ref Range   SARS Coronavirus 2 by RT PCR NEGATIVE NEGATIVE       Assessment & Plan:   1. Moderate persistent reactive airway disease with acute exacerbation Has been uncontrolled - likely due to not being on trelegy - which we were unaware of - last refill was done by float provider and I suspect her refill requests never got done due to that and also [place] not knowing - I encouraged her to send mychart msg or call when meds expected to be refilled are not getting refilled - likely a communication error  Restart trelegy, allergy meds, singulair (mood good - no SI), use symbicort prn for rescue, nebs available for when sick FMLA done again to allow her to leave work with severe exacerbations, to do neb tx when needed and do her f/up OV 2x a year plus additional appts as needed for acute exacerbations - Fluticasone-Umeclidin-Vilant (TRELEGY ELLIPTA) 100-62.5-25 MCG/ACT AEPB; Inhale 1 puff into the lungs daily.  Dispense: 3 each; Refill: 3  2. Encounter for completion of form with patient FMLA recert done for asthma  3. Adverse effect of drug, initial encounter N/V to po flagyl, she will use vaginal metrogel in the future if needed, added to allergy list       Danelle Berry, PA-C 03/14/23 11:09 AM

## 2023-04-08 ENCOUNTER — Inpatient Hospital Stay: Payer: 59

## 2023-04-08 ENCOUNTER — Inpatient Hospital Stay: Payer: 59 | Attending: Oncology | Admitting: Licensed Clinical Social Worker

## 2023-04-08 ENCOUNTER — Encounter: Payer: Self-pay | Admitting: Licensed Clinical Social Worker

## 2023-04-08 DIAGNOSIS — Z803 Family history of malignant neoplasm of breast: Secondary | ICD-10-CM

## 2023-04-08 DIAGNOSIS — Z8042 Family history of malignant neoplasm of prostate: Secondary | ICD-10-CM

## 2023-04-08 DIAGNOSIS — Z801 Family history of malignant neoplasm of trachea, bronchus and lung: Secondary | ICD-10-CM

## 2023-04-08 DIAGNOSIS — Z800 Family history of malignant neoplasm of digestive organs: Secondary | ICD-10-CM

## 2023-04-08 NOTE — Progress Notes (Signed)
REFERRING PROVIDER: Danelle Berry, PA-C [address] [place],  [place] [address]  PRIMARY PROVIDER:  Danelle Berry, PA-C  PRIMARY REASON FOR VISIT:  1. Family history of breast cancer   2. Family history of prostate cancer   3. Family history of stomach cancer   4. Family history of lung cancer      HISTORY OF PRESENT ILLNESS:   Loretta Jensen, a 37 y.o. female, was seen for a [facility] cancer genetics consultation at the request of Danelle Berry, PA-C due to a family history of cancer.  Loretta Jensen presents to clinic today to discuss the possibility of a hereditary predisposition to cancer, genetic testing, and to further clarify her future cancer risks, as well as potential cancer risks for family members.   CANCER HISTORY:  Loretta Jensen is a 37 y.o. female with no personal history of cancer.    RISK FACTORS:  Menarche was at age 24.  First live birth at age 29.  Ovaries intact: yes.  Hysterectomy: no.  Menopausal status: premenopausal.  HRT use: 0 years. Colonoscopy: no; not examined. Mammogram within the last year: yes. Number of breast biopsies: 0.  Past Medical History:  Diagnosis Date   Allergy    COVID 10/05/2020   Family history of breast cancer    Headache    History of cesarean delivery 12/04/2016   Obesity affecting pregnancy 03/04/2018   Rh negative state in antepartum period 07/09/2018   Short interval between pregnancies affecting pregnancy, antepartum 01/19/2018   Status post cesarean delivery 03/02/2017   Type O blood, Rh negative     Past Surgical History:  Procedure Laterality Date   CESAREAN SECTION  2009   CESAREAN SECTION N/A 03/02/2017   Procedure: CESAREAN SECTION;  Surgeon: Conard Novak, MD;  Location: [facility] ORS;  Service: Obstetrics;  Laterality: N/A;   CESAREAN SECTION N/A 07/10/2018   Procedure: CESAREAN SECTION;  Surgeon: Conard Novak, MD;  Location: [facility] ORS;  Service: Obstetrics;  Laterality: N/A;   TUBAL LIGATION  Bilateral 07/10/2018   Procedure: BILATERAL TUBAL LIGATION;  Surgeon: Conard Novak, MD;  Location: [facility] ORS;  Service: Obstetrics;  Laterality: Bilateral;    FAMILY HISTORY:  We obtained a detailed, 4-generation family history.  Significant diagnoses are listed below: Family History  Problem Relation Age of Onset   Breast cancer Mother 37   Liver cancer Maternal Grandfather 30   Stomach cancer Paternal Grandmother    Breast cancer Other 74       2018 - neg GT [facility]riad   Breast cancer Other 55       2013   Breast cancer Other        1990s   Breast cancer Other    Loretta Jensen has 1 son and 2 daughters.  Loretta Jensen has one full brother. He is 12 with no cancer history. The patient also has 2 maternal half sisters and 1 maternal half brother, all living and no history of cancer. She has 3 paternal half brothers as well, no history of cancer she is aware of.   Loretta Jensen mother had breast cancer at 40 and died at 57. Loretta Jensen does not have maternal aunts or uncles. The patient's maternal grandmother is living at 104. This grandmother has 2 sisters, both have had breast cancer. One had breast cancer in her 55s, the other was recently diagnosed in her 81s. This great aunt had genetic testing, patient brought this report today and it showed negative  MyRisk testing in 2018. A great uncle had prostate cancer. Great grandmother had breast cancer and great great uncle had pancreatic cancer. Loretta Jensen maternal grandfather is living at 26 and has liver cancer. His sister died from breast cancer, and so did his mother.    Loretta Jensen has limited information about her father's side. Her father is living at 46, no cancer she is aware of. She has 5 paternal uncles, 7 aunts. Three aunts died from cancer, one lung cancer and two unknown cancers.  Her paternal grandmother had stomach cancer and died around the age of 36. She has no information about her paternal grandfather.    Loretta Jensen  is aware of previous family history of genetic testing for hereditary cancer risks. . There is no reported Ashkenazi Jewish ancestry. There is no known consanguinity.    GENETIC COUNSELING ASSESSMENT: Loretta Jensen is a 37 y.o. female with a family history of breast cancer (diagnosed in her mother at 13) which is somewhat suggestive of a hereditary cancer syndrome and predisposition to cancer. We, therefore, discussed and recommended the following at today's visit.   DISCUSSION: We discussed that approximately 10% of breast cancer is hereditary. Most cases of hereditary breast cancer are associated with BRCA1/BRCA2 genes, although there are other genes associated with hereditary cancer as well. Cancers and risks are gene specific. We discussed that testing is beneficial for several reasons including knowing about cancer risks, identifying potential screening and risk-reduction options that may be appropriate, and to understand if other family members could be at risk for cancer and allow them to undergo genetic testing.   We reviewed the characteristics, features and inheritance patterns of hereditary cancer syndromes. We also discussed genetic testing, including the appropriate family members to test, the process of testing, insurance coverage and turn-around-time for results. We discussed the implications of a negative, positive and/or variant of uncertain significant result. We recommended Loretta Jensen pursue genetic testing for the [employer] CancerNext-Expanded+RNA gene panel.   Based on Loretta Jensen's family history of cancer, she meets medical criteria for genetic testing. Despite that she meets criteria, she may still have an out of pocket cost.   PLAN: After considering the risks, benefits, and limitations, Loretta Jensen provided informed consent to pursue genetic testing and the blood sample was sent to [facility] for analysis of the CancerNext-Expanded+RNA panel. Results should be available  within approximately 2-3 weeks' time, at which point they will be disclosed by telephone to Loretta Jensen, as will any additional recommendations warranted by these results. Loretta Jensen will receive a summary of her genetic counseling visit and a copy of her results once available. This information will also be available in Epic.   Loretta Jensen questions were answered to her satisfaction today. Our contact information was provided should additional questions or concerns arise. Thank you for the referral and allowing [place] to share in the care of your patient.   Lacy Duverney, MS, [facility] Genetic Counselor [email or web address] Phone: [phone]  The patient was seen for a total of 20 minutes in face-to-face genetic counseling.  Dr. Blake Divine was available for discussion regarding this case.   _______________________________________________________________________ For Office Staff:  Number of people involved in session: 1 Was an Intern/ student involved with case: no

## 2023-04-24 ENCOUNTER — Telehealth: Payer: Self-pay | Admitting: Licensed Clinical Social Worker

## 2023-04-24 ENCOUNTER — Encounter: Payer: Self-pay | Admitting: Licensed Clinical Social Worker

## 2023-04-24 NOTE — Telephone Encounter (Signed)
error 

## 2023-05-01 ENCOUNTER — Inpatient Hospital Stay: Payer: 59 | Attending: Oncology

## 2023-05-19 ENCOUNTER — Telehealth: Payer: Self-pay | Admitting: Licensed Clinical Social Worker

## 2023-05-19 ENCOUNTER — Encounter: Payer: Self-pay | Admitting: Licensed Clinical Social Worker

## 2023-05-19 ENCOUNTER — Ambulatory Visit: Payer: Self-pay | Admitting: Licensed Clinical Social Worker

## 2023-05-19 DIAGNOSIS — Z1379 Encounter for other screening for genetic and chromosomal anomalies: Secondary | ICD-10-CM

## 2023-05-19 NOTE — Telephone Encounter (Signed)
I contacted Ms. Hickok to discuss her genetic testing results. No pathogenic variants were identified in the 71 genes analyzed. Detailed clinic note to follow.   The test report has been scanned into EPIC and is located under the Molecular Pathology section of the Results Review tab.  A portion of the result report is included below for reference.      Lacy Duverney, MS, [facility] Genetic Counselor [email or web address] Phone: [phone]

## 2023-05-19 NOTE — Progress Notes (Signed)
HPI:   Ms. Piasecki was previously seen in the [facility] due to a family history of cancer and concerns regarding a hereditary predisposition to cancer. Please refer to our prior cancer genetics clinic note for more information regarding our discussion, assessment and recommendations, at the time. Ms. Mineo recent genetic test results were disclosed to her, as were recommendations warranted by these results. These results and recommendations are discussed in more detail below.  CANCER HISTORY:  Oncology History   No history exists.    FAMILY HISTORY:  We obtained a detailed, 4-generation family history.  Significant diagnoses are listed below: Family History  Problem Relation Age of Onset   Breast cancer Mother 19   Liver cancer Maternal Grandfather 55   Stomach cancer Paternal Grandmother    Breast cancer Other 10       2018 - neg GT [facility]   Breast cancer Other 55       2013   Breast cancer Other        1990s   Breast cancer Other    Ms. Piedra has 1 son and 2 daughters.  Ms. Luviano has one full brother. He is 64 with no cancer history. The patient also has 2 maternal half sisters and 1 maternal half brother, all living and no history of cancer. She has 3 paternal half brothers as well, no history of cancer she is aware of.   Ms. Erdely mother had breast cancer at 33 and died at 69. Ms. Catullo does not have maternal aunts or uncles. The patient's maternal grandmother is living at 29. This grandmother has 2 sisters, both have had breast cancer. One had breast cancer in her 51s, the other was recently diagnosed in her 75s. This great aunt had genetic testing, patient brought this report today and it showed negative MyRisk testing in 2018. A great uncle had prostate cancer. Great grandmother had breast cancer and great great uncle had pancreatic cancer. Ms. Knack maternal grandfather is living at 23 and has liver cancer. His sister died from breast  cancer, and so did his mother.    Ms. Wickerham has limited information about her father's side. Her father is living at 89, no cancer she is aware of. She has 5 paternal uncles, 7 aunts. Three aunts died from cancer, one lung cancer and two unknown cancers.  Her paternal grandmother had stomach cancer and died around the age of 4. She has no information about her paternal grandfather.    Ms. Bajaj is aware of previous family history of genetic testing for hereditary cancer risks. . There is no reported Ashkenazi Jewish ancestry. There is no known consanguinity.      GENETIC TEST RESULTS:  The [facility] CancerNext-Expanded+RNA Panel found no pathogenic mutations.   The CancerNext-Expanded + RNAinsight gene panel offered by [employer] and includes sequencing and rearrangement analysis for the following 71 genes: AIP, ALK, APC*, ATM*, AXIN2, BAP1, BARD1, BMPR1A, BRCA1*, BRCA2*, BRIP1*, CDC73, CDH1*,CDK4, CDKN1B, CDKN2A, CHEK2*, CTNNA1, DICER1, FH, FLCN, KIF1B, LZTR1, MAX, MEN1, MET, MLH1*, MSH2*, MSH3, MSH6*, MUTYH*, NF1*, NF2, NTHL1, PALB2*, PHOX2B, PMS2*, POT1, PRKAR1A, PTCH1, PTEN*, RAD51C*, RAD51D*,RB1, RET, SDHA, SDHAF2, SDHB, SDHC, SDHD, SMAD4, SMARCA4, SMARCB1, SMARCE1, STK11, SUFU, TMEM127, TP53*,TSC1, TSC2, VHL; EGFR, EGLN1, HOXB13, KIT, MITF, PDGFRA, POLD1 and POLE (sequencing only); EPCAM and GREM1 (deletion/duplication only).   The test report has been scanned into EPIC and is located under the Molecular Pathology section of the Results Review tab.  A portion of the  result report is included below for reference. Genetic testing reported out on 05/10/2023.      Even though a pathogenic variant was not identified, possible explanations for the cancer in the family may include: There may be no hereditary risk for cancer in the family. The cancers in Ms. Strawser and/or her family may be sporadic/familial or due to other genetic and environmental factors. There may be a gene mutation in  one of these genes that current testing methods cannot detect but that chance is small. There could be another gene that has not yet been discovered, or that we have not yet tested, that is responsible for the cancer diagnoses in the family.  It is also possible there is a hereditary cause for the cancer in the family that Ms. Nunley did not inherit. Therefore, it is important to remain in touch with cancer genetics in the future so that we can continue to offer Ms. Skarda the most up to date genetic testing.   ADDITIONAL GENETIC TESTING:  We discussed with Ms. Duch that her genetic testing was fairly extensive.  If there are additional relevant genes identified to increase cancer risk that can be analyzed in the future, we would be happy to discuss and coordinate this testing at that time.    CANCER SCREENING RECOMMENDATIONS:  Ms. Trueba test result is considered negative (normal).  This means that we have not identified a hereditary cause for her family history of cancer at this time.   An individual's cancer risk and medical management are not determined by genetic test results alone. Overall cancer risk assessment incorporates additional factors, including personal medical history, family history, and any available genetic information that may result in a personalized plan for cancer prevention and surveillance. Therefore, it is recommended she continue to follow the cancer management and screening guidelines provided by her primary healthcare provider.  Based on the reported personal and family history, specific cancer screenings for Ms. Amenah Harries and her family include:  Breast Cancer Screening:  Ms. Supak'sTyrer-Cuzick risk score is 18%.  This is above the general population risk of 13%, therefore, she is encouraged to continue to be mindful of her family history and be diligent with general population breast screening, including annual mammograms beginning 10 years  prior to the youngest diagnosis in her family or by age 28.  She is encouraged to contact us regarding any changes to her personal or family history, as her recommendations for screening would be altered significantly if her lifetime risk is determined to be greater than 20% based on updated information.     RECOMMENDATIONS FOR FAMILY MEMBERS:   Since she did not inherit a identifiable mutation in a cancer predisposition gene included on this panel, her children could not have inherited a known mutation from her in one of these genes. Individuals in this family might be at some increased risk of developing cancer, over the general population risk, due to the family history of cancer.  Individuals in the family should notify their providers of the family history of cancer. We recommend women in this family have a yearly mammogram beginning at age 3, or 53 years younger than the earliest onset of cancer, an annual clinical breast exam, and perform monthly breast self-exams.  Family members should have colonoscopies by at age 96, or earlier, as recommended by their providers. Other members of the family may still carry a pathogenic variant in one of these genes that Ms. Morici did not inherit. Based on  the family history, we recommend her maternal relatives, have genetic counseling and testing. Ms. Fury will let us know if we can be of any assistance in coordinating genetic counseling and/or testing for this family member.    FOLLOW-UP:  Lastly, we discussed with Ms. Laraia that cancer genetics is a rapidly advancing field and it is possible that new genetic tests will be appropriate for her and/or her family members in the future. We encouraged her to remain in contact with cancer genetics on an annual basis so we can update her personal and family histories and let her know of advances in cancer genetics that may benefit this family.   Our contact number was provided. Ms. Coverstone questions  were answered to her satisfaction, and she knows she is welcome to call us at anytime with additional questions or concerns.    Lacy Duverney, MS, [facility] Genetic Counselor [email or web address] Phone: [phone]

## 2023-06-10 ENCOUNTER — Encounter: Payer: Self-pay | Admitting: Family Medicine

## 2023-06-12 ENCOUNTER — Telehealth (INDEPENDENT_AMBULATORY_CARE_PROVIDER_SITE_OTHER): Payer: 59 | Admitting: Family Medicine

## 2023-06-12 DIAGNOSIS — J45901 Unspecified asthma with (acute) exacerbation: Secondary | ICD-10-CM

## 2023-06-12 DIAGNOSIS — J329 Chronic sinusitis, unspecified: Secondary | ICD-10-CM | POA: Diagnosis not present

## 2023-06-12 MED ORDER — PREDNISONE 20 MG PO TABS
40.0000 mg | ORAL_TABLET | Freq: Every day | ORAL | 0 refills | Status: DC
Start: 2023-06-12 — End: 2023-09-05

## 2023-06-12 MED ORDER — AMOXICILLIN-POT CLAVULANATE 875-125 MG PO TABS
1.0000 | ORAL_TABLET | Freq: Two times a day (BID) | ORAL | 0 refills | Status: AC
Start: 2023-06-12 — End: 2023-06-19

## 2023-06-12 NOTE — Progress Notes (Signed)
Name: Loretta Jensen   MRN: [id number]    DOB: 12/18/1985   Date:06/12/2023       Progress Note  Subjective:    Chief Complaint  Chief Complaint  Patient presents with   Medical Management of Chronic Issues    I connected with  Sofiah Enge  on 06/12/23 at  8:40 AM EDT by a video enabled telemedicine application and verified that I am speaking with the correct person using two identifiers.  I discussed the limitations of evaluation and management by telemedicine and the availability of in person appointments. The patient expressed understanding and agreed to proceed. Staff also discussed with the patient that there may be a patient responsible charge related to this service. Patient Location: home Provider Location: out of clinic office - private Additional Individuals present:   HPI Pt presents with asthma exacerbation, sx worse for the last 2 weeks She's had worse than normal sinus drainage, pain and pressure, worse cough esp at night  She did COVID test earlier this week with neg covid test x 1   Recently on amoxicillin for ear infection x 10 d about a month ago   Patient Active Problem List   Diagnosis Date Noted   Genetic testing 05/19/2023   Family history of thyroid disease 03/03/2023   Reactive airway disease 03/17/2020   Chronic midline low back pain without sciatica 03/17/2020   Family history of breast cancer    Seasonal allergic rhinitis 01/19/2019   Family history of breast cancer in mother 01/19/2019   Menorrhagia with irregular cycle 01/19/2019   BMI 34.0-34.9,adult 03/04/2018   Migraine headache 01/23/2018   Occupational exposure to dust 05/23/2015    Social History   Tobacco Use   Smoking status: Never   Smokeless tobacco: Never  Substance Use Topics   Alcohol use: Yes    Alcohol/week: 0.0 standard drinks of alcohol    Comment: occasionally     Current Outpatient Medications:    albuterol (VENTOLIN HFA) 108 (90 Base) MCG/ACT inhaler,  Inhale 2 puffs into the lungs every 4 (four) hours as needed for wheezing or shortness of breath., Disp: 2 each, Rfl: 2   benzonatate (TESSALON) 100 MG capsule, Take 2 capsules (200 mg total) by mouth every 8 (eight) hours., Disp: 21 capsule, Rfl: 0   budesonide-formoterol (SYMBICORT) 160-4.5 MCG/ACT inhaler, Inhale 2 puffs into the lungs 2 (two) times daily., Disp: 3 each, Rfl: 3   cetirizine (ZYRTEC) 10 MG tablet, Take 1 tablet (10 mg total) by mouth daily., Disp: 30 tablet, Rfl: 11   famotidine (PEPCID) 20 MG tablet, Take 1 tablet (20 mg total) by mouth daily., Disp: 30 tablet, Rfl: 1   fluticasone (FLONASE) 50 MCG/ACT nasal spray, Place 2 sprays into both nostrils daily., Disp: 16 g, Rfl: 6   Fluticasone-Umeclidin-Vilant (TRELEGY ELLIPTA) 100-62.5-25 MCG/ACT AEPB, Inhale 1 puff into the lungs daily., Disp: 3 each, Rfl: 3   ibuprofen (ADVIL) 600 MG tablet, TAKE 1 TABLET BY MOUTH EVERY 8 HOURS AS NEEDED FOR HEADACHE, MILD TO MODERATE PAIN OR CRAMPING, Disp: 90 tablet, Rfl: 0   ipratropium (ATROVENT) 0.06 % nasal spray, Place 2 sprays into both nostrils 4 (four) times daily., Disp: 15 mL, Rfl: 12   ipratropium-albuterol (DUONEB) 0.5-2.5 (3) MG/3ML SOLN, Take 3 mLs by nebulization 3 (three) times daily as needed., Disp: 360 mL, Rfl: 1   levocetirizine (XYZAL) 5 MG tablet, Take 1 tablet (5 mg total) by mouth every evening., Disp: 90 tablet, Rfl: 1   montelukast (  SINGULAIR) 10 MG tablet, Take 1 tablet (10 mg total) by mouth at bedtime., Disp: 90 tablet, Rfl: 3   Olopatadine HCl 0.2 % SOLN, Use 1 drop in each eye BID, Disp: 2.5 mL, Rfl: 0   ondansetron (ZOFRAN-ODT) 8 MG disintegrating tablet, Take 1 tablet (8 mg total) by mouth every 8 (eight) hours as needed for nausea or vomiting., Disp: 20 tablet, Rfl: 0   promethazine-dextromethorphan (PROMETHAZINE-DM) 6.25-15 MG/5ML syrup, Take 5 mLs by mouth 4 (four) times daily as needed., Disp: 118 mL, Rfl: 0   sucralfate (CARAFATE) 1 g tablet, Take 1 tablet (1 g  total) by mouth 4 (four) times daily., Disp: 60 tablet, Rfl: 0   fluconazole (DIFLUCAN) 150 MG tablet, Take 1 tablet (150 mg total) by mouth every 3 (three) days as needed (for vaginal itching/yeast infection sx). (Patient not taking: Reported on 06/12/2023), Disp: 3 tablet, Rfl: 0  Allergies  Allergen Reactions   Metronidazole Nausea And Vomiting    TID dose caused N/V    I personally reviewed active problem list, medication list, allergies, family history, social history, health maintenance, notes from last encounter, lab results, imaging with the patient/caregiver today.   Review of Systems  Constitutional: Negative.   HENT: Negative.    Eyes: Negative.   Respiratory: Negative.    Cardiovascular: Negative.   Gastrointestinal: Negative.   Endocrine: Negative.   Genitourinary: Negative.   Musculoskeletal: Negative.   Skin: Negative.   Allergic/Immunologic: Negative.   Neurological: Negative.   Hematological: Negative.   Psychiatric/Behavioral: Negative.    All other systems reviewed and are negative.     Objective:   Virtual encounter, vitals limited, only able to obtain the following There were no vitals filed for this visit. There is no height or weight on file to calculate BMI. Nursing Note and Vital Signs reviewed.  Physical Exam Vitals and nursing note reviewed.  Constitutional:      General: She is not in acute distress.    Appearance: Normal appearance. She is not ill-appearing, toxic-appearing or diaphoretic.  Neck:     Trachea: Phonation normal.  Pulmonary:     Effort: No tachypnea or respiratory distress.     Comments: No audible wheeze or stridor, pt able to speak in full and complete sentences Neurological:     Mental Status: She is alert.     PE limited by virtual encounter  No results found for this or any previous visit (from the past 72 hour(s)).  Assessment and Plan:   1. Moderate asthma with exacerbation, unspecified whether  persistent Worse sx x 2 weeks doing nebs, coughing Tx with steroids, continue OTC and prior Rx cough meds - predniSONE (DELTASONE) 20 MG tablet; Take 2 tablets (40 mg total) by mouth daily.  Dispense: 10 tablet; Refill: 0  2. Rhinosinusitis Ongoing sx x 2 weeks, pain and pressure comes and goes, she felt well enough to go to work last night, mostly has exposure to viruses - advised her for now while feeling well enough to work etc hold the abx and continue supportive tx and her regular sinus/allergy meds Would recommend only starting augmentin if she has sudden worsening of sinus pain/pressure with HA/fever She has already had sx for 2 weeks - amoxicillin-clavulanate (AUGMENTIN) 875-125 MG tablet; Take 1 tablet by mouth 2 (two) times daily for 7 days.  Dispense: 14 tablet; Refill: 0    -Red flags and when to present for emergency care or RTC including fever >101.35F, chest pain, shortness of breath,  new/worsening/un-resolving symptoms, reviewed with patient at time of visit. Follow up and care instructions discussed and provided in AVS. - I discussed the assessment and treatment plan with the patient. The patient was provided an opportunity to ask questions and all were answered. The patient agreed with the plan and demonstrated an understanding of the instructions.  I provided 15+ minutes of non-face-to-face time during this encounter.  Danelle Berry, PA-C 06/12/23 8:47 AM

## 2023-09-05 ENCOUNTER — Ambulatory Visit (INDEPENDENT_AMBULATORY_CARE_PROVIDER_SITE_OTHER): Payer: 59 | Admitting: Physician Assistant

## 2023-09-05 VITALS — BP 122/78 | HR 91 | Resp 16 | Ht 64.0 in | Wt 208.0 lb

## 2023-09-05 DIAGNOSIS — R051 Acute cough: Secondary | ICD-10-CM | POA: Diagnosis not present

## 2023-09-05 DIAGNOSIS — J4541 Moderate persistent asthma with (acute) exacerbation: Secondary | ICD-10-CM | POA: Diagnosis not present

## 2023-09-05 DIAGNOSIS — J0100 Acute maxillary sinusitis, unspecified: Secondary | ICD-10-CM | POA: Diagnosis not present

## 2023-09-05 DIAGNOSIS — J029 Acute pharyngitis, unspecified: Secondary | ICD-10-CM

## 2023-09-05 LAB — POCT RAPID STREP A (OFFICE): Rapid Strep A Screen: NEGATIVE

## 2023-09-05 MED ORDER — AMOXICILLIN-POT CLAVULANATE 875-125 MG PO TABS
1.0000 | ORAL_TABLET | Freq: Two times a day (BID) | ORAL | 0 refills | Status: DC
Start: 2023-09-05 — End: 2023-10-10

## 2023-09-05 MED ORDER — PREDNISONE 20 MG PO TABS
ORAL_TABLET | ORAL | 0 refills | Status: DC
Start: 2023-09-05 — End: 2023-10-10

## 2023-09-05 MED ORDER — PROMETHAZINE-DM 6.25-15 MG/5ML PO SYRP
5.0000 mL | ORAL_SOLUTION | Freq: Four times a day (QID) | ORAL | 0 refills | Status: DC | PRN
Start: 2023-09-05 — End: 2023-10-10

## 2023-09-05 NOTE — Progress Notes (Signed)
Acute Office Visit   Patient: Loretta Jensen   DOB: 09-03-1986   37 y.o. Female  MRN: [id number] Visit Date: 09/05/2023  Today's healthcare provider: Oswaldo Conroy Jamahl Lemmons, PA-C  Introduced myself to the patient as a Secondary school teacher and provided education on APPs in clinical practice.    Chief Complaint  Patient presents with   Allergies    Started Monday. OTC meds and breathing treatments not helping.   Sore Throat   Subjective    HPI HPI     Allergies    Additional comments: Started Monday. OTC meds and breathing treatments not helping.      Last edited by Dollene Primrose, CMA on 09/05/2023 10:48 AM.       Allergies/ upper respiratory symptoms    Onset: gradual Duration: She thinks all this started Monday but ear symptoms have been going on for about a week  Associated symptoms: ear pain and subjective drainage, sore throat Has developed coughing -productive   Interventions: Mucinex, warm water with lemon and honey, Flonase    She has been taking Zyrtec, singulair and breathing treatments as directed   Medications: Outpatient Medications Prior to Visit  Medication Sig   albuterol (VENTOLIN HFA) 108 (90 Base) MCG/ACT inhaler Inhale 2 puffs into the lungs every 4 (four) hours as needed for wheezing or shortness of breath.   budesonide-formoterol (SYMBICORT) 160-4.5 MCG/ACT inhaler Inhale 2 puffs into the lungs 2 (two) times daily.   cetirizine (ZYRTEC) 10 MG tablet Take 1 tablet (10 mg total) by mouth daily.   fluticasone (FLONASE) 50 MCG/ACT nasal spray Place 2 sprays into both nostrils daily.   Fluticasone-Umeclidin-Vilant (TRELEGY ELLIPTA) 100-62.5-25 MCG/ACT AEPB Inhale 1 puff into the lungs daily.   ibuprofen (ADVIL) 600 MG tablet TAKE 1 TABLET BY MOUTH EVERY 8 HOURS AS NEEDED FOR HEADACHE, MILD TO MODERATE PAIN OR CRAMPING   ipratropium (ATROVENT) 0.06 % nasal spray Place 2 sprays into both nostrils 4 (four) times daily.   ipratropium-albuterol (DUONEB) 0.5-2.5 (3)  MG/3ML SOLN Take 3 mLs by nebulization 3 (three) times daily as needed.   levocetirizine (XYZAL) 5 MG tablet Take 1 tablet (5 mg total) by mouth every evening.   montelukast (SINGULAIR) 10 MG tablet Take 1 tablet (10 mg total) by mouth at bedtime.   Olopatadine HCl 0.2 % SOLN Use 1 drop in each eye BID   sucralfate (CARAFATE) 1 g tablet Take 1 tablet (1 g total) by mouth 4 (four) times daily.   famotidine (PEPCID) 20 MG tablet Take 1 tablet (20 mg total) by mouth daily.   [DISCONTINUED] benzonatate (TESSALON) 100 MG capsule Take 2 capsules (200 mg total) by mouth every 8 (eight) hours. (Patient not taking: Reported on 09/05/2023)   [DISCONTINUED] fluconazole (DIFLUCAN) 150 MG tablet Take 1 tablet (150 mg total) by mouth every 3 (three) days as needed (for vaginal itching/yeast infection sx). (Patient not taking: Reported on 09/05/2023)   [DISCONTINUED] ondansetron (ZOFRAN-ODT) 8 MG disintegrating tablet Take 1 tablet (8 mg total) by mouth every 8 (eight) hours as needed for nausea or vomiting. (Patient not taking: Reported on 09/05/2023)   [DISCONTINUED] predniSONE (DELTASONE) 20 MG tablet Take 2 tablets (40 mg total) by mouth daily. (Patient not taking: Reported on 09/05/2023)   [DISCONTINUED] promethazine-dextromethorphan (PROMETHAZINE-DM) 6.25-15 MG/5ML syrup Take 5 mLs by mouth 4 (four) times daily as needed. (Patient not taking: Reported on 09/05/2023)   No facility-administered medications prior to visit.    Review of Systems  Constitutional:  Negative for chills and fever.  HENT:  Positive for congestion, ear pain, nosebleeds, postnasal drip, sinus pressure, sinus pain, sore throat and voice change.   Respiratory:  Positive for cough. Negative for shortness of breath.   Gastrointestinal:  Negative for diarrhea, nausea and vomiting.  Musculoskeletal:  Negative for myalgias.  Neurological:  Negative for dizziness and headaches.        Objective    BP 122/78   Pulse 91   Resp 16    Ht 5\' 4"  (1.626 m)   Wt 208 lb (94.3 kg)   SpO2 98%   BMI 35.70 kg/m     Physical Exam Vitals reviewed.  Constitutional:      General: She is awake.     Appearance: Normal appearance. She is well-developed and well-groomed.  HENT:     Head: Normocephalic and atraumatic.     Right Ear: Hearing, tympanic membrane and ear canal normal.     Left Ear: A middle ear effusion is present.     Mouth/Throat:     Lips: Pink.     Mouth: Mucous membranes are moist.     Pharynx: Oropharynx is clear. Uvula midline. Posterior oropharyngeal erythema and postnasal drip present. No oropharyngeal exudate or uvula swelling.     Tonsils: No tonsillar exudate or tonsillar abscesses.  Cardiovascular:     Rate and Rhythm: Normal rate and regular rhythm.     Heart sounds: Normal heart sounds. No murmur heard.    No friction rub. No gallop.  Pulmonary:     Effort: Pulmonary effort is normal.     Breath sounds: Decreased air movement present. Decreased breath sounds present. No wheezing, rhonchi or rales.  Musculoskeletal:     Cervical back: Normal range of motion and neck supple.  Lymphadenopathy:     Head:     Right side of head: No submental, submandibular or preauricular adenopathy.     Left side of head: No submental, submandibular or preauricular adenopathy.     Cervical:     Right cervical: No superficial cervical adenopathy.    Left cervical: No superficial cervical adenopathy.     Upper Body:     Right upper body: No supraclavicular adenopathy.     Left upper body: No supraclavicular adenopathy.  Neurological:     Mental Status: She is alert.  Psychiatric:        Behavior: Behavior is cooperative.       Results for orders placed or performed in visit on 09/05/23  POCT rapid strep A  Result Value Ref Range   Rapid Strep A Screen Negative Negative    Assessment & Plan      No follow-ups on file.      Problem List Items Addressed This Visit       Respiratory   Reactive  airway disease - Primary   Relevant Medications   predniSONE (DELTASONE) 20 MG tablet   Other Visit Diagnoses       Acute maxillary sinusitis, recurrence not specified       Relevant Medications   predniSONE (DELTASONE) 20 MG tablet   amoxicillin-clavulanate (AUGMENTIN) 875-125 MG tablet   promethazine-dextromethorphan (PROMETHAZINE-DM) 6.25-15 MG/5ML syrup     Sore throat       Relevant Orders   POCT rapid strep A (Completed)     Acute cough       Relevant Medications   promethazine-dextromethorphan (PROMETHAZINE-DM) 6.25-15 MG/5ML syrup      Patient has history of reactive  airway disease which I suspect is likely acutely exacerbated due to sinusitis She reports ongoing cough, sore throat, sinus pressure that is not resolving with home measures Reviewed her inhaler regimen with her.  It appears that she is taking appropriately and as directed Will provide prednisone taper to assist with symptom management along with Augmentin for sinusitis Rapid strep was negative-results reviewed with patient during appointment Will also provide promethazine-DM to assist with cough Reviewed OTC medications for further symptomatic management Reviewed ED and return precautions Follow-up as needed for progressing or persistent symptoms  No follow-ups on file.   I, Ryer Asato E Wilfred Siverson, PA-C, have reviewed all documentation for this visit. The documentation on 09/05/23 for the exam, diagnosis, procedures, and orders are all accurate and complete.   Jacquelin Hawking, MHS, PA-C [facility]

## 2023-09-09 ENCOUNTER — Ambulatory Visit: Payer: 59 | Admitting: Physician Assistant

## 2023-10-10 ENCOUNTER — Ambulatory Visit (INDEPENDENT_AMBULATORY_CARE_PROVIDER_SITE_OTHER): Payer: BC Managed Care – PPO | Admitting: Physician Assistant

## 2023-10-10 ENCOUNTER — Encounter: Payer: Self-pay | Admitting: Physician Assistant

## 2023-10-10 VITALS — BP 132/70 | HR 94 | Resp 16 | Ht 64.0 in | Wt 208.0 lb

## 2023-10-10 DIAGNOSIS — K219 Gastro-esophageal reflux disease without esophagitis: Secondary | ICD-10-CM | POA: Diagnosis not present

## 2023-10-10 DIAGNOSIS — J302 Other seasonal allergic rhinitis: Secondary | ICD-10-CM

## 2023-10-10 DIAGNOSIS — J45909 Unspecified asthma, uncomplicated: Secondary | ICD-10-CM | POA: Diagnosis not present

## 2023-10-10 DIAGNOSIS — J45901 Unspecified asthma with (acute) exacerbation: Secondary | ICD-10-CM | POA: Insufficient documentation

## 2023-10-10 MED ORDER — MONTELUKAST SODIUM 10 MG PO TABS
10.0000 mg | ORAL_TABLET | Freq: Every day | ORAL | 0 refills | Status: AC
Start: 2023-10-10 — End: ?

## 2023-10-10 MED ORDER — LEVOCETIRIZINE DIHYDROCHLORIDE 5 MG PO TABS
5.0000 mg | ORAL_TABLET | Freq: Every evening | ORAL | 0 refills | Status: AC
Start: 2023-10-10 — End: ?

## 2023-10-10 NOTE — Assessment & Plan Note (Signed)
Chronic, historic condition She reports that she typically has flares when she is under added stress She is not taking Pepcid or sucralfate regularly.  She only uses as needed Reviewed that both of these medications are preventative in nature and work best when you are taking consistently and prior to expected symptoms Recommend that she takes them prior to known triggering events or stressors to help alleviate symptoms Patient voiced understanding and agreement Follow-up in 6 months or sooner if concerns arise

## 2023-10-10 NOTE — Progress Notes (Signed)
Established Patient Office Visit  Name: Loretta Jensen   MRN: [id number]    DOB: September 21, 1986   Date:10/10/2023  Today's Provider: Jacquelin Hawking, MHS, PA-C Introduced myself to the patient as a PA-C and provided education on APPs in clinical practice.         Subjective  Chief Complaint  Chief Complaint  Patient presents with   Medical Management of Chronic Issues    6 months    HPI  Asthma  She reports she is feeling better since she was last seen but thinks she may be having some postnasal drainage and scratchy throat   She reports several members of household are sick  She reports she had some chest tightness the other day along with dizziness but this was resolved with a breathing treatment  She is still using daily inhalers and reports having to use her rescue inhaler sometime twice per day  She switches between Zyrtec and Xyzal  and is taking Singulair daily   Acid reflux  She reports her reflux seems to flare with stress  She is taking Pepcid and sucralfate off and on  She states she has not had a flare since last month    Patient Active Problem List   Diagnosis Date Noted   Moderate asthma with exacerbation 10/10/2023   Gastroesophageal reflux disease 10/10/2023   Genetic testing 05/19/2023   Family history of thyroid disease 03/03/2023   Reactive airway disease 03/17/2020   Chronic midline low back pain without sciatica 03/17/2020   Family history of breast cancer    Seasonal allergic rhinitis 01/19/2019   Family history of breast cancer in mother 01/19/2019   Menorrhagia with irregular cycle 01/19/2019   BMI 34.0-34.9,adult 03/04/2018   Migraine headache 01/23/2018   Occupational exposure to dust 05/23/2015    Past Surgical History:  Procedure Laterality Date   CESAREAN SECTION  2009   CESAREAN SECTION N/A 03/02/2017   Procedure: CESAREAN SECTION;  Surgeon: Conard Novak, MD;  Location: [facility] ORS;  Service: Obstetrics;  Laterality: N/A;    CESAREAN SECTION N/A 07/10/2018   Procedure: CESAREAN SECTION;  Surgeon: Conard Novak, MD;  Location: [facility] OR[facility];  Service: Obstetrics;  Laterality: N/A;   TUBAL LIGATION Bilateral 07/10/2018   Procedure: BILATERAL TUBAL LIGATION;  Surgeon: Conard Novak, MD;  Location: [facility] OR[facility];  Service: Obstetrics;  Laterality: Bilateral;    Family History  Problem Relation Age of Onset   Breast cancer Mother 36   Liver cancer Maternal Grandfather 57   Stomach cancer Paternal Grandmother    Breast cancer Other 58       2018 - neg [facility]   Breast cancer Other 55       2013   Breast cancer Other        1990s   Breast cancer Other     Social History   Tobacco Use   Smoking status: Never   Smokeless tobacco: Never  Substance Use Topics   Alcohol use: Yes    Alcohol/week: 0.0 standard drinks of alcohol    Comment: occasionally     Current Outpatient Medications:    albuterol (VENTOLIN HFA) 108 (90 Base) MCG/ACT inhaler, Inhale 2 puffs into the lungs every 4 (four) hours as needed for wheezing or shortness of breath., Disp: 2 each, Rfl: 2   cetirizine (ZYRTEC) 10 MG tablet, Take 1 tablet (10 mg total) by mouth daily., Disp: 30 tablet, Rfl: 11   fluticasone (  FLONASE) 50 MCG/ACT nasal spray, Place 2 sprays into both nostrils daily., Disp: 16 g, Rfl: 6   Fluticasone-Umeclidin-Vilant (TRELEGY ELLIPTA) 100-62.5-25 MCG/ACT AEPB, Inhale 1 puff into the lungs daily., Disp: 3 each, Rfl: 3   ibuprofen (ADVIL) 600 MG tablet, TAKE 1 TABLET BY MOUTH EVERY 8 HOURS AS NEEDED FOR HEADACHE, MILD TO MODERATE PAIN OR CRAMPING, Disp: 90 tablet, Rfl: 0   ipratropium (ATROVENT) 0.06 % nasal spray, Place 2 sprays into both nostrils 4 (four) times daily., Disp: 15 mL, Rfl: 12   ipratropium-albuterol (DUONEB) 0.5-2.5 (3) MG/3ML SOLN, Take 3 mLs by nebulization 3 (three) times daily as needed., Disp: 360 mL, Rfl: 1   Olopatadine HCl 0.2 % SOLN, Use 1 drop in each eye BID, Disp: 2.5 mL, Rfl: 0   sucralfate  (CARAFATE) 1 g tablet, Take 1 tablet (1 g total) by mouth 4 (four) times daily., Disp: 60 tablet, Rfl: 0   famotidine (PEPCID) 20 MG tablet, Take 1 tablet (20 mg total) by mouth daily., Disp: 30 tablet, Rfl: 1   levocetirizine (XYZAL) 5 MG tablet, Take 1 tablet (5 mg total) by mouth every evening., Disp: 90 tablet, Rfl: 0   montelukast (SINGULAIR) 10 MG tablet, Take 1 tablet (10 mg total) by mouth at bedtime., Disp: 90 tablet, Rfl: 0  Allergies  Allergen Reactions   Metronidazole Nausea And Vomiting    TID dose caused N/V    I personally reviewed active problem list, medication list, allergies, notes from last encounter, lab results with the patient/caregiver today.   Review of Systems  Constitutional:  Negative for chills, fever and malaise/fatigue.  Respiratory:  Negative for shortness of breath and wheezing.   Neurological:  Positive for dizziness.      Objective  Vitals:   10/10/23 0853  BP: 132/70  Pulse: 94  Resp: 16  SpO2: 96%  Weight: 208 lb (94.3 kg)  Height: 5\' 4"  (1.626 m)    Body mass index is 35.7 kg/m.  Physical Exam Vitals reviewed.  Constitutional:      General: She is awake.     Appearance: Normal appearance. She is well-developed and well-groomed.  HENT:     Head: Normocephalic and atraumatic.  Cardiovascular:     Rate and Rhythm: Normal rate and regular rhythm.     Pulses: Normal pulses.          Radial pulses are 2+ on the right side and 2+ on the left side.     Heart sounds: Normal heart sounds. No murmur heard.    No friction rub. No gallop.  Pulmonary:     Effort: Pulmonary effort is normal.     Breath sounds: Normal breath sounds. No decreased air movement. No decreased breath sounds, wheezing, rhonchi or rales.  Musculoskeletal:     Cervical back: Normal range of motion.     Right lower leg: No edema.     Left lower leg: No edema.  Neurological:     General: No focal deficit present.     Mental Status: She is alert and oriented to  person, place, and time. Mental status is at baseline.     GCS: GCS eye subscore is 4. GCS verbal subscore is 5. GCS motor subscore is 6.  Psychiatric:        Attention and Perception: Attention and perception normal.        Mood and Affect: Mood and affect normal.        Speech: Speech normal.  Behavior: Behavior normal. Behavior is cooperative.        Thought Content: Thought content normal.        Cognition and Memory: Cognition normal.      Recent Results (from the past 2160 hours)  POCT rapid strep A     Status: None   Collection Time: 09/05/23 10:48 AM  Result Value Ref Range   Rapid Strep A Screen Negative Negative     PHQ2/9:    06/12/2023    8:11 AM 03/14/2023   10:53 AM 03/10/2023    9:25 AM 03/03/2023    1:16 PM 01/10/2023    2:48 PM  Depression screen PHQ 2/9  Decreased Interest 0 0 0 0 0  Down, Depressed, Hopeless 0 0 0 0 0  PHQ - 2 Score 0 0 0 0 0  Altered sleeping 0 0 0  0  Tired, decreased energy 0 0 0  0  Change in appetite 0 0 0  0  Feeling bad or failure about yourself  0 0 0  0  Trouble concentrating 0 0 0  0  Moving slowly or fidgety/restless 0 0 0  0  Suicidal thoughts 0 0 0  0  PHQ-9 Score 0 0 0  0  Difficult doing work/chores Not difficult at all Not difficult at all Not difficult at all  Not difficult at all      Fall Risk:    06/12/2023    8:11 AM 03/14/2023   10:53 AM 03/10/2023    9:25 AM 03/03/2023    1:15 PM 01/10/2023    2:48 PM  Fall Risk   Falls in the past year? 0 0 0 0 0  Number falls in past yr: 0 0 0 0 0  Injury with Fall? 0 0 0 0 0  Risk for fall due to : No Fall Risks No Fall Risks No Fall Risks  No Fall Risks  Follow up Falls prevention discussed;Education provided;Falls evaluation completed Falls prevention discussed;Education provided;Falls evaluation completed Falls prevention discussed;Education provided;Falls evaluation completed  Falls prevention discussed;Education provided;Falls evaluation completed       Functional Status Survey:      Assessment & Plan  Problem List Items Addressed This Visit       Respiratory   Seasonal allergic rhinitis   Chronic, historic condition She is currently taking Singulair daily and alternates between Zyrtec and Xyzal for efficacy She occasionally uses Flonase Continue current regimen Follow-up in 6 months or sooner if concerns arise      Relevant Medications   levocetirizine (XYZAL) 5 MG tablet   montelukast (SINGULAIR) 10 MG tablet   Moderate asthma with exacerbation - Primary   Chronic, historic condition Appears well-managed on her current regimen comprised of Trelegy daily and albuterol inhaler as needed.  She also has nebulizer at home for as needed management She was recently sick reports some lingering postnasal drainage and chest tightness.  She reports chest tightness was relieved with breathing treatment. Recommend that she continues current regimen Follow-up in about 6 months or sooner if concerns arise       Relevant Medications   montelukast (SINGULAIR) 10 MG tablet     Digestive   Gastroesophageal reflux disease   Chronic, historic condition She reports that she typically has flares when she is under added stress She is not taking Pepcid or sucralfate regularly.  She only uses as needed Reviewed that both of these medications are preventative in nature and work best when you are  taking consistently and prior to expected symptoms Recommend that she takes them prior to known triggering events or stressors to help alleviate symptoms Patient voiced understanding and agreement Follow-up in 6 months or sooner if concerns arise        Return in about 6 months (around 04/08/2024) for Asthma, Annual Physical, GERD.   I, Marieliz Strang E Yoandri Congrove, PA-C, have reviewed all documentation for this visit. The documentation on 10/10/23 for the exam, diagnosis, procedures, and orders are all accurate and complete.   Jacquelin Hawking, MHS,  PA-C [facility]

## 2023-10-10 NOTE — Assessment & Plan Note (Signed)
Chronic, historic condition She is currently taking Singulair daily and alternates between Zyrtec and Xyzal for efficacy She occasionally uses Flonase Continue current regimen Follow-up in 6 months or sooner if concerns arise

## 2023-10-10 NOTE — Assessment & Plan Note (Signed)
Chronic, historic condition Appears well-managed on her current regimen comprised of Trelegy daily and albuterol inhaler as needed.  She also has nebulizer at home for as needed management She was recently sick reports some lingering postnasal drainage and chest tightness.  She reports chest tightness was relieved with breathing treatment. Recommend that she continues current regimen Follow-up in about 6 months or sooner if concerns arise

## 2023-10-10 NOTE — Patient Instructions (Signed)
Please remember to take Pepcid prior to stressful situations or times to help prevent reflux symptoms   Remember to use your Trelegy daily and use your rescue inhaler and nebulizer as needed for exacerbations

## 2023-11-20 ENCOUNTER — Ambulatory Visit (INDEPENDENT_AMBULATORY_CARE_PROVIDER_SITE_OTHER): Payer: BC Managed Care – PPO | Admitting: Family Medicine

## 2023-11-20 ENCOUNTER — Encounter: Payer: Self-pay | Admitting: Family Medicine

## 2023-11-20 VITALS — BP 122/70 | HR 93 | Temp 98.1°F | Resp 16 | Ht 64.0 in | Wt 207.0 lb

## 2023-11-20 DIAGNOSIS — K529 Noninfective gastroenteritis and colitis, unspecified: Secondary | ICD-10-CM | POA: Diagnosis not present

## 2023-11-20 DIAGNOSIS — J029 Acute pharyngitis, unspecified: Secondary | ICD-10-CM

## 2023-11-20 DIAGNOSIS — R6889 Other general symptoms and signs: Secondary | ICD-10-CM | POA: Diagnosis not present

## 2023-11-20 DIAGNOSIS — J4541 Moderate persistent asthma with (acute) exacerbation: Secondary | ICD-10-CM | POA: Diagnosis not present

## 2023-11-20 LAB — POC COVID19/FLU A&B COMBO
Covid Antigen, POC: NEGATIVE
Influenza A Antigen, POC: NEGATIVE
Influenza B Antigen, POC: NEGATIVE

## 2023-11-20 LAB — POCT RAPID STREP A (OFFICE): Rapid Strep A Screen: NEGATIVE

## 2023-11-20 MED ORDER — ONDANSETRON 4 MG PO TBDP
4.0000 mg | ORAL_TABLET | Freq: Three times a day (TID) | ORAL | 0 refills | Status: DC | PRN
Start: 2023-11-20 — End: 2024-01-07

## 2023-11-20 MED ORDER — PREDNISONE 20 MG PO TABS: 40.0000 mg | ORAL_TABLET | Freq: Every day | ORAL | 0 refills | Status: AC

## 2023-11-20 NOTE — Progress Notes (Signed)
 Patient ID: Loretta Jensen, female    DOB: 03/08/1986, 38 y.o.   MRN: [id number]  PCP: Danelle Berry, PA-C  Chief Complaint  Patient presents with   Generalized Body Aches    Started Monday, getting worse. Chills   Diarrhea   Emesis    Subjective:   Loretta Jensen is a 38 y.o. female, presents to clinic with CC of the following:  HPI  Pt had some coughing sneezing and fatigue for a few day and then last night she got cold chills and last night 3 am threw up  And diarrhea also just started. No abd pain She has asthma and has done neb treatments, last one was Tuesday (2 d ago)  She hasn't tried po's since she last and first threw up last night She has zofran at home but did not try it   Patient Active Problem List   Diagnosis Date Noted   Moderate asthma with exacerbation 10/10/2023   Gastroesophageal reflux disease 10/10/2023   Genetic testing 05/19/2023   Family history of thyroid disease 03/03/2023   Reactive airway disease 03/17/2020   Chronic midline low back pain without sciatica 03/17/2020   Family history of breast cancer    Seasonal allergic rhinitis 01/19/2019   Family history of breast cancer in mother 01/19/2019   Menorrhagia with irregular cycle 01/19/2019   BMI 34.0-34.9,adult 03/04/2018   Migraine headache 01/23/2018   Occupational exposure to dust 05/23/2015      Current Outpatient Medications:    albuterol (VENTOLIN HFA) 108 (90 Base) MCG/ACT inhaler, Inhale 2 puffs into the lungs every 4 (four) hours as needed for wheezing or shortness of breath., Disp: 2 each, Rfl: 2   cetirizine (ZYRTEC) 10 MG tablet, Take 1 tablet (10 mg total) by mouth daily., Disp: 30 tablet, Rfl: 11   fluticasone (FLONASE) 50 MCG/ACT nasal spray, Place 2 sprays into both nostrils daily., Disp: 16 g, Rfl: 6   Fluticasone-Umeclidin-Vilant (TRELEGY ELLIPTA) 100-62.5-25 MCG/ACT AEPB, Inhale 1 puff into the lungs daily., Disp: 3 each, Rfl: 3   ibuprofen (ADVIL) 600 MG  tablet, TAKE 1 TABLET BY MOUTH EVERY 8 HOURS AS NEEDED FOR HEADACHE, MILD TO MODERATE PAIN OR CRAMPING, Disp: 90 tablet, Rfl: 0   ipratropium (ATROVENT) 0.06 % nasal spray, Place 2 sprays into both nostrils 4 (four) times daily., Disp: 15 mL, Rfl: 12   ipratropium-albuterol (DUONEB) 0.5-2.5 (3) MG/3ML SOLN, Take 3 mLs by nebulization 3 (three) times daily as needed., Disp: 360 mL, Rfl: 1   levocetirizine (XYZAL) 5 MG tablet, Take 1 tablet (5 mg total) by mouth every evening., Disp: 90 tablet, Rfl: 0   montelukast (SINGULAIR) 10 MG tablet, Take 1 tablet (10 mg total) by mouth at bedtime., Disp: 90 tablet, Rfl: 0   Olopatadine HCl 0.2 % SOLN, Use 1 drop in each eye BID, Disp: 2.5 mL, Rfl: 0   sucralfate (CARAFATE) 1 g tablet, Take 1 tablet (1 g total) by mouth 4 (four) times daily., Disp: 60 tablet, Rfl: 0   famotidine (PEPCID) 20 MG tablet, Take 1 tablet (20 mg total) by mouth daily., Disp: 30 tablet, Rfl: 1   Allergies  Allergen Reactions   Metronidazole Nausea And Vomiting    TID dose caused N/V     Social History   Tobacco Use   Smoking status: Never   Smokeless tobacco: Never  Vaping Use   Vaping status: Never Used  Substance Use Topics   Alcohol use: Yes    Alcohol/week: 0.0 standard  drinks of alcohol    Comment: occasionally   Drug use: No      Chart Review Today: I personally reviewed active problem list, medication list, allergies, family history, social history, health maintenance, notes from last encounter, lab results, imaging with the patient/caregiver today.  Review of Systems  Constitutional: Negative.   HENT: Negative.    Eyes: Negative.   Respiratory: Negative.    Cardiovascular: Negative.   Gastrointestinal: Negative.   Endocrine: Negative.   Genitourinary: Negative.   Musculoskeletal: Negative.   Skin: Negative.   Allergic/Immunologic: Negative.   Neurological: Negative.   Hematological: Negative.   Psychiatric/Behavioral: Negative.    All other  systems reviewed and are negative.      Objective:   Vitals:   11/20/23 1109  BP: 122/70  Pulse: 93  Resp: 16  Temp: 98.1 F (36.7 C)  SpO2: 98%  Weight: 207 lb (93.9 kg)  Height: 5\' 4"  (1.626 m)    Body mass index is 35.53 kg/m.  Physical Exam Vitals and nursing note reviewed.  Constitutional:      General: She is not in acute distress.    Appearance: Normal appearance. She is well-developed. She is obese. She is not ill-appearing, toxic-appearing or diaphoretic.  HENT:     Head: Normocephalic and atraumatic.     Nose: Nose normal.  Eyes:     General:        Right eye: No discharge.        Left eye: No discharge.     Conjunctiva/sclera: Conjunctivae normal.  Neck:     Trachea: No tracheal deviation.  Cardiovascular:     Rate and Rhythm: Normal rate and regular rhythm.     Pulses: Normal pulses.     Heart sounds: Normal heart sounds.  Pulmonary:     Effort: Pulmonary effort is normal. No respiratory distress.     Breath sounds: Normal breath sounds. No stridor. No wheezing, rhonchi or rales.  Abdominal:     General: Bowel sounds are normal. There is no distension.     Palpations: Abdomen is soft.     Tenderness: There is no abdominal tenderness. There is no guarding or rebound.  Skin:    General: Skin is warm and dry.     Findings: No rash.  Neurological:     Mental Status: She is alert.     Motor: No abnormal muscle tone.     Coordination: Coordination normal.  Psychiatric:        Mood and Affect: Mood normal.        Behavior: Behavior normal.      Results for orders placed or performed in visit on 11/20/23  POCT rapid strep A   Collection Time: 11/20/23 11:13 AM  Result Value Ref Range   Rapid Strep A Screen Negative Negative  POC Covid19/Flu A&B Antigen   Collection Time: 11/20/23 11:26 AM  Result Value Ref Range   Influenza A Antigen, POC Negative Negative   Influenza B Antigen, POC Negative Negative   Covid Antigen, POC Negative Negative        Assessment & Plan:   Here with body aches, chills, cough, N/V/D onset 3 d ago, neg here for flu, covid and strep Likely other viral illness causing gastroenteritis Will tx with antiemetics, anelgesics (tylenol) Work note to excuse her until fever, vomiting and diarrhea free for more than 24 hours w/o help of meds Zofran sent in, slow diet progression reviewed with pt  Watch for asthma exacerbation -  prednisone rx'd   1. Flu-like symptoms (Primary) - not flu- likely other viral illness - POC Covid19/Flu A&B Antigen  2. Sore throat - POCT rapid strep A  3. Gastroenteritis - ondansetron (ZOFRAN-ODT) 4 MG disintegrating tablet; Take 1-2 tablets (4-8 mg total) by mouth every 8 (eight) hours as needed for nausea or vomiting.  Dispense: 20 tablet; Refill: 0  4. Moderate persistent reactive airway disease with acute exacerbation Continue nebs and SABA PRN if worsening sx then start prednisone - try to make sure not taken on an empty stomach Do not take if wheeze/SOB is well controlled with only few nebs every couple days  Work note to excuse her until Monday written up and set through mychart Pt to reach out if she needs different work note depending on her sx    Danelle Berry, PA-C 11/20/23 11:29 AM

## 2023-12-10 ENCOUNTER — Encounter: Payer: Self-pay | Admitting: Family Medicine

## 2023-12-10 ENCOUNTER — Ambulatory Visit (INDEPENDENT_AMBULATORY_CARE_PROVIDER_SITE_OTHER): Admitting: Family Medicine

## 2023-12-10 ENCOUNTER — Other Ambulatory Visit (HOSPITAL_COMMUNITY)
Admission: RE | Admit: 2023-12-10 | Discharge: 2023-12-10 | Disposition: A | Discharge status: Home / Self Care | Source: Ambulatory Visit | Attending: Family Medicine | Admitting: Family Medicine

## 2023-12-10 VITALS — BP 128/76 | HR 82 | Resp 16 | Ht 64.0 in | Wt 202.0 lb

## 2023-12-10 DIAGNOSIS — N898 Other specified noninflammatory disorders of vagina: Secondary | ICD-10-CM

## 2023-12-10 DIAGNOSIS — R7303 Prediabetes: Secondary | ICD-10-CM

## 2023-12-10 DIAGNOSIS — Z113 Encounter for screening for infections with a predominantly sexual mode of transmission: Secondary | ICD-10-CM | POA: Insufficient documentation

## 2023-12-10 DIAGNOSIS — R350 Frequency of micturition: Secondary | ICD-10-CM | POA: Diagnosis not present

## 2023-12-10 DIAGNOSIS — D500 Iron deficiency anemia secondary to blood loss (chronic): Secondary | ICD-10-CM

## 2023-12-10 DIAGNOSIS — Z5181 Encounter for therapeutic drug level monitoring: Secondary | ICD-10-CM

## 2023-12-10 DIAGNOSIS — R102 Pelvic and perineal pain: Secondary | ICD-10-CM

## 2023-12-10 DIAGNOSIS — N920 Excessive and frequent menstruation with regular cycle: Secondary | ICD-10-CM

## 2023-12-10 LAB — POCT URINALYSIS DIPSTICK
Appearance: NORMAL
Bilirubin, UA: NEGATIVE
Blood, UA: NEGATIVE
Glucose, UA: NEGATIVE
Ketones, UA: NEGATIVE
Leukocytes, UA: NEGATIVE
Nitrite, UA: NEGATIVE
Protein, UA: NEGATIVE
Spec Grav, UA: 1.015 (ref 1.010–1.025)
Urobilinogen, UA: 0.2 U/dL
pH, UA: 6 (ref 5.0–8.0)

## 2023-12-10 NOTE — Progress Notes (Signed)
 Patient ID: Cynda Soule, female    DOB: 1985/12/19, 38 y.o.   MRN: [id number]  PCP: Danelle Berry, PA-C  Chief Complaint  Patient presents with   STD Screen    Subjective:   Loretta Jensen is a 38 y.o. female, presents to clinic with CC of the following:  HPI  Here for STD screening, no new sexual partners or exposure, but she would like to be sure due to discharge/Itch here and there and discharge brown with cramping which feels similar to when she starts her period, but she just ended her cycle in the last week  She's having cramps suprapubic and low back and very fatigued, and urinating more frequently than normal   LMP 3/6 ended 11th 2/7th Some brown discharge and cramping today   Patient Active Problem List   Diagnosis Date Noted   Moderate asthma with exacerbation 10/10/2023   Gastroesophageal reflux disease 10/10/2023   Genetic testing 05/19/2023   Family history of thyroid disease 03/03/2023   Reactive airway disease 03/17/2020   Chronic midline low back pain without sciatica 03/17/2020   Family history of breast cancer    Seasonal allergic rhinitis 01/19/2019   Family history of breast cancer in mother 01/19/2019   Menorrhagia with irregular cycle 01/19/2019   BMI 34.0-34.9,adult 03/04/2018   Migraine headache 01/23/2018   Occupational exposure to dust 05/23/2015      Current Outpatient Medications:    albuterol (VENTOLIN HFA) 108 (90 Base) MCG/ACT inhaler, Inhale 2 puffs into the lungs every 4 (four) hours as needed for wheezing or shortness of breath., Disp: 2 each, Rfl: 2   cetirizine (ZYRTEC) 10 MG tablet, Take 1 tablet (10 mg total) by mouth daily., Disp: 30 tablet, Rfl: 11   fluticasone (FLONASE) 50 MCG/ACT nasal spray, Place 2 sprays into both nostrils daily., Disp: 16 g, Rfl: 6   Fluticasone-Umeclidin-Vilant (TRELEGY ELLIPTA) 100-62.5-25 MCG/ACT AEPB, Inhale 1 puff into the lungs daily., Disp: 3 each, Rfl: 3   ibuprofen (ADVIL) 600 MG  tablet, TAKE 1 TABLET BY MOUTH EVERY 8 HOURS AS NEEDED FOR HEADACHE, MILD TO MODERATE PAIN OR CRAMPING, Disp: 90 tablet, Rfl: 0   ipratropium (ATROVENT) 0.06 % nasal spray, Place 2 sprays into both nostrils 4 (four) times daily., Disp: 15 mL, Rfl: 12   ipratropium-albuterol (DUONEB) 0.5-2.5 (3) MG/3ML SOLN, Take 3 mLs by nebulization 3 (three) times daily as needed., Disp: 360 mL, Rfl: 1   levocetirizine (XYZAL) 5 MG tablet, Take 1 tablet (5 mg total) by mouth every evening., Disp: 90 tablet, Rfl: 0   montelukast (SINGULAIR) 10 MG tablet, Take 1 tablet (10 mg total) by mouth at bedtime., Disp: 90 tablet, Rfl: 0   Olopatadine HCl 0.2 % SOLN, Use 1 drop in each eye BID, Disp: 2.5 mL, Rfl: 0   ondansetron (ZOFRAN-ODT) 4 MG disintegrating tablet, Take 1-2 tablets (4-8 mg total) by mouth every 8 (eight) hours as needed for nausea or vomiting., Disp: 20 tablet, Rfl: 0   sucralfate (CARAFATE) 1 g tablet, Take 1 tablet (1 g total) by mouth 4 (four) times daily., Disp: 60 tablet, Rfl: 0   famotidine (PEPCID) 20 MG tablet, Take 1 tablet (20 mg total) by mouth daily., Disp: 30 tablet, Rfl: 1   Allergies  Allergen Reactions   Metronidazole Nausea And Vomiting    TID dose caused N/V     Social History   Tobacco Use   Smoking status: Never   Smokeless tobacco: Never  Vaping Use  Vaping status: Never Used  Substance Use Topics   Alcohol use: Yes    Alcohol/week: 0.0 standard drinks of alcohol    Comment: occasionally   Drug use: No      Chart Review Today: I personally reviewed active problem list, medication list, allergies, family history, social history, health maintenance, notes from last encounter, lab results, imaging with the patient/caregiver today.   Review of Systems  Constitutional: Negative.   HENT: Negative.    Eyes: Negative.   Respiratory: Negative.    Cardiovascular: Negative.   Gastrointestinal: Negative.   Endocrine: Negative.   Genitourinary: Negative.    Musculoskeletal: Negative.   Skin: Negative.   Allergic/Immunologic: Negative.   Neurological: Negative.   Hematological: Negative.   Psychiatric/Behavioral: Negative.    All other systems reviewed and are negative.      Objective:   Vitals:   12/10/23 1359  BP: 128/76  Pulse: 82  Resp: 16  SpO2: 98%  Weight: 202 lb (91.6 kg)  Height: 5\' 4"  (1.626 m)    Body mass index is 34.67 kg/m.  Physical Exam Vitals and nursing note reviewed.  Constitutional:      Appearance: Normal appearance. She is well-developed. She is obese.  HENT:     Head: Normocephalic and atraumatic.     Nose: Nose normal.  Eyes:     General:        Right eye: No discharge.        Left eye: No discharge.     Conjunctiva/sclera: Conjunctivae normal.  Neck:     Trachea: No tracheal deviation.  Cardiovascular:     Rate and Rhythm: Normal rate and regular rhythm.     Pulses: Normal pulses.     Heart sounds: Normal heart sounds.  Pulmonary:     Effort: Pulmonary effort is normal. No respiratory distress.     Breath sounds: Normal breath sounds. No stridor.  Musculoskeletal:        General: Normal range of motion.  Skin:    General: Skin is warm and dry.     Findings: No rash.  Neurological:     Mental Status: She is alert.     Motor: No abnormal muscle tone.     Coordination: Coordination normal.  Psychiatric:        Behavior: Behavior normal.      Results for orders placed or performed in visit on 11/20/23  POCT rapid strep A   Collection Time: 11/20/23 11:13 AM  Result Value Ref Range   Rapid Strep A Screen Negative Negative  POC Covid19/Flu A&B Antigen   Collection Time: 11/20/23 11:26 AM  Result Value Ref Range   Influenza A Antigen, POC Negative Negative   Influenza B Antigen, POC Negative Negative   Covid Antigen, POC Negative Negative       Assessment & Plan:   Here for STD screen, concerns with cramping, discharge, feels like she may start her period but she just  completed cycle No exposure No vaginal pain with sex Hx of heavy periods with IDA, not currently on OCP or taking iron We will do a blood draw for STD screening so we reviewed her other Dx and other labs that may be due soon as well:  1. Screen for STD (sexually transmitted disease) (Primary) No new partners or exposure, no pain with sex Will screen and tx anything positive, doubt PID - Cervicovaginal ancillary only - HIV antibody (with reflex) - RPR  2. Iron deficiency anemia due to chronic  blood loss Recheck labs, no currently on OCP or iron supplement - CBC with Differential/Platelet - Iron, TIBC and Ferritin Panel  3. Vaginal discharge [place] Passey with some irritation and cramping  4. Menorrhagia with regular cycle Discussed options to tx with continuous monophasic OCP if she has IDA again - CBC with Differential/Platelet - Iron, TIBC and Ferritin Panel  5. Prediabetes Reviewed last labs Can recheck while already doing blood work No meds No particular diet/lifestyle efforts - COMPLETE METABOLIC PANEL WITH GFR - Hemoglobin A1c  6. Encounter for medication monitoring  - HIV antibody (with reflex) - RPR - Urine Culture - CBC with Differential/Platelet - COMPLETE METABOLIC PANEL WITH GFR - Hemoglobin A1c - Iron, TIBC and Ferritin Panel  7. Suprapubic pain With some urinary frequency, cramping will eval for UTI - Urine Culture  8. Urinary frequency  - Urine Culture - POCT Urinalysis Dipstick      Danelle Berry, PA-C 12/10/23 2:29 PM

## 2023-12-11 LAB — CBC WITH DIFFERENTIAL/PLATELET
Absolute Lymphocytes: 1814 {cells}/uL (ref 850–3900)
Absolute Monocytes: 768 {cells}/uL (ref 200–950)
Basophils Absolute: 29 {cells}/uL (ref 0–200)
Basophils Relative: 0.3 %
Eosinophils Absolute: 58 {cells}/uL (ref 15–500)
Eosinophils Relative: 0.6 %
HCT: 35 % (ref 35.0–45.0)
Hemoglobin: 11.1 g/dL — ABNORMAL LOW (ref 11.7–15.5)
MCH: 24 pg — ABNORMAL LOW (ref 27.0–33.0)
MCHC: 31.7 g/dL — ABNORMAL LOW (ref 32.0–36.0)
MCV: 75.6 fL — ABNORMAL LOW (ref 80.0–100.0)
MPV: 8.9 fL (ref 7.5–12.5)
Monocytes Relative: 8 %
Neutro Abs: 6931 {cells}/uL (ref 1500–7800)
Neutrophils Relative %: 72.2 %
Platelets: 404 10*3/uL — ABNORMAL HIGH (ref 140–400)
RBC: 4.63 10*6/uL (ref 3.80–5.10)
RDW: 14.7 % (ref 11.0–15.0)
Total Lymphocyte: 18.9 %
WBC: 9.6 10*3/uL (ref 3.8–10.8)

## 2023-12-11 LAB — COMPLETE METABOLIC PANEL WITH GFR
AG Ratio: 1.6 (calc) (ref 1.0–2.5)
ALT: 12 U/L (ref 6–29)
AST: 12 U/L (ref 10–30)
Albumin: 4.3 g/dL (ref 3.6–5.1)
Alkaline phosphatase (APISO): 55 U/L (ref 31–125)
BUN: 11 mg/dL (ref 7–25)
CO2: 27 mmol/L (ref 20–32)
Calcium: 9.2 mg/dL (ref 8.6–10.2)
Chloride: 105 mmol/L (ref 98–110)
Creat: 0.68 mg/dL (ref 0.50–0.97)
Globulin: 2.7 g/dL (ref 1.9–3.7)
Glucose, Bld: 87 mg/dL (ref 65–99)
Potassium: 4 mmol/L (ref 3.5–5.3)
Sodium: 138 mmol/L (ref 135–146)
Total Bilirubin: 0.3 mg/dL (ref 0.2–1.2)
Total Protein: 7 g/dL (ref 6.1–8.1)

## 2023-12-11 LAB — URINE CULTURE
MICRO NUMBER:: 16222230
SPECIMEN QUALITY:: ADEQUATE

## 2023-12-11 LAB — IRON,TIBC AND FERRITIN PANEL
%SAT: 6 % — ABNORMAL LOW (ref 16–45)
Ferritin: 6 ng/mL — ABNORMAL LOW (ref 16–154)
Iron: 22 ug/dL — ABNORMAL LOW (ref 40–190)
TIBC: 390 ug/dL (ref 250–450)

## 2023-12-11 LAB — HIV ANTIBODY (ROUTINE TESTING W REFLEX): HIV 1&2 Ab, 4th Generation: NONREACTIVE

## 2023-12-11 LAB — HEMOGLOBIN A1C
Hgb A1c MFr Bld: 5.7 %{Hb} — ABNORMAL HIGH (ref <5.7)
Mean Plasma Glucose: 117 mg/dL
eAG (mmol/L): 6.5 mmol/L

## 2023-12-11 LAB — RPR: RPR Ser Ql: NONREACTIVE

## 2023-12-12 LAB — CERVICOVAGINAL ANCILLARY ONLY
Bacterial Vaginitis (gardnerella): NEGATIVE
Candida Glabrata: NEGATIVE
Candida Vaginitis: NEGATIVE
Chlamydia: NEGATIVE
Comment: NEGATIVE
Comment: NEGATIVE
Comment: NEGATIVE
Comment: NEGATIVE
Comment: NEGATIVE
Comment: NORMAL
Neisseria Gonorrhea: NEGATIVE
Trichomonas: NEGATIVE

## 2023-12-15 ENCOUNTER — Encounter: Payer: Self-pay | Admitting: Family Medicine

## 2024-01-07 ENCOUNTER — Encounter: Payer: Self-pay | Admitting: Emergency Medicine

## 2024-01-07 ENCOUNTER — Telehealth

## 2024-01-07 ENCOUNTER — Ambulatory Visit: Admission: EM | Admit: 2024-01-07 | Discharge: 2024-01-07 | Disposition: A | Discharge status: Home / Self Care

## 2024-01-07 DIAGNOSIS — J069 Acute upper respiratory infection, unspecified: Secondary | ICD-10-CM | POA: Diagnosis not present

## 2024-01-07 DIAGNOSIS — H66003 Acute suppurative otitis media without spontaneous rupture of ear drum, bilateral: Secondary | ICD-10-CM

## 2024-01-07 MED ORDER — ONDANSETRON 8 MG PO TBDP: 8.0000 mg | ORAL_TABLET | Freq: Three times a day (TID) | ORAL | 0 refills | Status: AC | PRN

## 2024-01-07 MED ORDER — BENZONATATE 100 MG PO CAPS: 200.0000 mg | ORAL_CAPSULE | Freq: Three times a day (TID) | ORAL | 0 refills | Status: DC

## 2024-01-07 MED ORDER — PROMETHAZINE-DM 6.25-15 MG/5ML PO SYRP: 5.0000 mL | ORAL_SOLUTION | Freq: Four times a day (QID) | ORAL | 0 refills | Status: DC | PRN

## 2024-01-07 MED ORDER — IPRATROPIUM BROMIDE 0.06 % NA SOLN: 2.0000 | Freq: Four times a day (QID) | NASAL | 12 refills | Status: AC

## 2024-01-07 MED ORDER — CEFDINIR 300 MG PO CAPS: 300.0000 mg | ORAL_CAPSULE | Freq: Two times a day (BID) | ORAL | 0 refills | Status: AC

## 2024-01-07 NOTE — Discharge Instructions (Signed)
 Take the cefdinir 300 mg twice daily with food for 7 days for treatment of your upper respiratory tract infection and your bilateral ear infection.  Use over-the-counter Tylenol and/or ibuprofen according the package instructions as needed for any fever or pain.  Use the Zofran every 8 hours as needed for nausea and vomiting.  Use the Atrovent nasal spray, 2 squirts in each nostril every 6 hours, as needed for runny nose and postnasal drip.  Use the Tessalon Perles every 8 hours during the day.  Take them with a small sip of water.  They may give you some numbness to the base of your tongue or a metallic taste in your mouth, this is normal.  Use the Promethazine DM cough syrup at bedtime for cough and congestion.  It will make you drowsy so do not take it during the day.  Return for reevaluation or see your primary care provider for any new or worsening symptoms.

## 2024-01-07 NOTE — ED Provider Notes (Signed)
 [facility]    CSN: [id number] Arrival date & time: 01/07/24  1853      History   Chief Complaint Chief Complaint  Patient presents with   Nasal Congestion   Sore Throat    HPI Lachrisha Ziebarth is a 38 y.o. female.   HPI  38 year old female with past medical history significant for obesity and migraine headaches presents for evaluation of respiratory symptoms that began 3 weeks ago and consist of intermittent fevers up to 100, runny nose and nasal congestion for yellow nasal discharge, bilateral ear pain, sore throat, and cough which causes posttussive emesis.  She had a telehealth appointment approximately a week and a half ago and was prescribed azithromycin and prednisone which helped briefly but when she finished the medications her symptoms returned.  Past Medical History:  Diagnosis Date   Allergy    COVID 10/05/2020   Family history of breast cancer    Headache    History of cesarean delivery 12/04/2016   Obesity affecting pregnancy 03/04/2018   Rh negative state in antepartum period 07/09/2018   Short interval between pregnancies affecting pregnancy, antepartum 01/19/2018   Status post cesarean delivery 03/02/2017   Type O blood, Rh negative     Patient Active Problem List   Diagnosis Date Noted   Moderate asthma with exacerbation 10/10/2023   Gastroesophageal reflux disease 10/10/2023   Genetic testing 05/19/2023   Family history of thyroid disease 03/03/2023   Reactive airway disease 03/17/2020   Chronic midline low back pain without sciatica 03/17/2020   Family history of breast cancer    Seasonal allergic rhinitis 01/19/2019   Family history of breast cancer in mother 01/19/2019   Menorrhagia with irregular cycle 01/19/2019   BMI 34.0-34.9,adult 03/04/2018   Migraine headache 01/23/2018   Occupational exposure to dust 05/23/2015    Past Surgical History:  Procedure Laterality Date   CESAREAN SECTION  2009   CESAREAN SECTION N/A 03/02/2017    Procedure: CESAREAN SECTION;  Surgeon: Conard Novak, MD;  Location: [facility];  Service: Obstetrics;  Laterality: N/A;   CESAREAN SECTION N/A 07/10/2018   Procedure: CESAREAN SECTION;  Surgeon: Conard Novak, MD;  Location: [facility];  Service: Obstetrics;  Laterality: N/A;   TUBAL LIGATION Bilateral 07/10/2018   Procedure: BILATERAL TUBAL LIGATION;  Surgeon: Conard Novak, MD;  Location: [facility];  Service: Obstetrics;  Laterality: Bilateral;    OB History     Gravida  3   Para  3   Term  3   Preterm      AB      Living  3      SAB      IAB      Ectopic      Multiple  0   Live Births  3            Home Medications    Prior to Admission medications   Medication Sig Start Date End Date Taking? Authorizing Provider  albuterol (VENTOLIN HFA) 108 (90 Base) MCG/ACT inhaler Inhale 2 puffs into the lungs every 4 (four) hours as needed for wheezing or shortness of breath. 04/05/22  Yes Danelle Berry, PA-C  benzonatate (TESSALON) 100 MG capsule Take 2 capsules (200 mg total) by mouth every 8 (eight) hours. 01/07/24  Yes Becky Augusta, NP  cefdinir (OMNICEF) 300 MG capsule Take 1 capsule (300 mg total) by mouth 2 (two) times daily for 7 days. 01/07/24 01/14/24 Yes Becky Augusta, NP  cetirizine (  ZYRTEC) 10 MG tablet Take 1 tablet (10 mg total) by mouth daily. 11/05/22  Yes Danelle Berry, PA-C  fluticasone (FLONASE) 50 MCG/ACT nasal spray Place 2 sprays into both nostrils daily. 01/19/19  Yes Doren Custard, FNP  Fluticasone-Umeclidin-Vilant (TRELEGY ELLIPTA) 100-62.5-25 MCG/ACT AEPB Inhale 1 puff into the lungs daily. 03/14/23  Yes Danelle Berry, PA-C  ipratropium (ATROVENT) 0.06 % nasal spray Place 2 sprays into both nostrils 4 (four) times daily. 01/07/24  Yes Becky Augusta, NP  montelukast (SINGULAIR) 10 MG tablet Take 1 tablet (10 mg total) by mouth at bedtime. 10/10/23  Yes Mecum, Erin E, PA-C  ondansetron (ZOFRAN-ODT) 8 MG disintegrating tablet Take 1 tablet (8 mg  total) by mouth every 8 (eight) hours as needed for nausea or vomiting. 01/07/24  Yes Becky Augusta, NP  promethazine-dextromethorphan (PROMETHAZINE-DM) 6.25-15 MG/5ML syrup Take 5 mLs by mouth 4 (four) times daily as needed. 01/07/24  Yes Becky Augusta, NP  famotidine (PEPCID) 20 MG tablet Take 1 tablet (20 mg total) by mouth daily. 06/27/22 06/27/23  Phineas Semen, MD  ibuprofen (ADVIL) 600 MG tablet TAKE 1 TABLET BY MOUTH EVERY 8 HOURS AS NEEDED FOR HEADACHE, MILD TO MODERATE PAIN OR CRAMPING 03/11/23   Danelle Berry, PA-C  ipratropium-albuterol (DUONEB) 0.5-2.5 (3) MG/3ML SOLN Take 3 mLs by nebulization 3 (three) times daily as needed. 12/02/22   Danelle Berry, PA-C  levocetirizine (XYZAL) 5 MG tablet Take 1 tablet (5 mg total) by mouth every evening. 10/10/23   Mecum, Erin E, PA-C  Olopatadine HCl 0.2 % SOLN Use 1 drop in each eye BID 01/18/21   Margaretann Loveless, PA-C  sucralfate (CARAFATE) 1 g tablet Take 1 tablet (1 g total) by mouth 4 (four) times daily. 06/27/22   Phineas Semen, MD    Family History Family History  Problem Relation Age of Onset   Breast cancer Mother 70   Liver cancer Maternal Grandfather 68   Stomach cancer Paternal Grandmother    Breast cancer Other 59       2018 - neg [facility]   Breast cancer Other 55       2013   Breast cancer Other        1990s   Breast cancer Other     Social History Social History   Tobacco Use   Smoking status: Never   Smokeless tobacco: Never  Vaping Use   Vaping status: Never Used  Substance Use Topics   Alcohol use: Yes    Alcohol/week: 0.0 standard drinks of alcohol    Comment: occasionally   Drug use: No     Allergies   Metronidazole   Review of Systems Review of Systems  Constitutional:  Positive for fever.  HENT:  Positive for congestion, ear pain, rhinorrhea and sore throat.   Respiratory:  Positive for cough. Negative for shortness of breath and wheezing.   Gastrointestinal:  Positive for vomiting. Negative  for diarrhea.     Physical Exam Triage Vital Signs ED Triage Vitals [01/07/24 1907]  Encounter Vitals Group     BP 131/73     Systolic BP Percentile      Diastolic BP Percentile      Pulse Rate 87     Resp 16     Temp 98.5 F (36.9 C)     Temp Source Oral     SpO2 100 %     Weight      Height      Head Circumference  Peak Flow      Pain Score      Pain Loc      Pain Education      Exclude from Growth Chart    No data found.  Updated Vital Signs BP 131/73 (BP Location: Left Arm)   Pulse 87   Temp 98.5 F (36.9 C) (Oral)   Resp 16   LMP 12/25/2023 (Exact Date)   SpO2 100%   Visual Acuity Right Eye Distance:   Left Eye Distance:   Bilateral Distance:    Right Eye Near:   Left Eye Near:    Bilateral Near:     Physical Exam Vitals and nursing note reviewed.  Constitutional:      Appearance: Normal appearance. She is not ill-appearing.  HENT:     Head: Normocephalic and atraumatic.     Right Ear: Ear canal and external ear normal. There is no impacted cerumen.     Left Ear: Ear canal and external ear normal. There is no impacted cerumen.     Ears:     Comments: Bilateral tympanic membranes are erythematous and injected with serous effusions.    Nose: Congestion and rhinorrhea present.     Comments: Mucosa is erythematous and markedly edematous with yellow discharge in both nares.    Mouth/Throat:     Mouth: Mucous membranes are moist.     Pharynx: Oropharynx is clear. Posterior oropharyngeal erythema present. No oropharyngeal exudate.     Comments: Tonsillar pillars and posterior pharynx are erythematous and injected.  Tonsillar pillars also 1+ edematous.  No appreciable exudate. Cardiovascular:     Rate and Rhythm: Normal rate and regular rhythm.     Pulses: Normal pulses.     Heart sounds: Normal heart sounds. No murmur heard.    No friction rub. No gallop.  Pulmonary:     Effort: Pulmonary effort is normal.     Breath sounds: Normal breath sounds.  No wheezing, rhonchi or rales.  Musculoskeletal:     Cervical back: Normal range of motion and neck supple. No tenderness.  Lymphadenopathy:     Cervical: No cervical adenopathy.  Skin:    General: Skin is warm and dry.     Capillary Refill: Capillary refill takes less than 2 seconds.     Findings: No rash.  Neurological:     General: No focal deficit present.     Mental Status: She is alert and oriented to person, place, and time.      UC Treatments / Results  Labs (all labs ordered are listed, but only abnormal results are displayed) Labs Reviewed - No data to display  EKG   Radiology No results found.  Procedures Procedures (including critical care time)  Medications Ordered in UC Medications - No data to display  Initial Impression / Assessment and Plan / UC Course  I have reviewed the triage vital signs and the nursing notes.  Pertinent labs & imaging results that were available during my care of the patient were reviewed by me and considered in my medical decision making (see chart for details).   Patient is a pleasant, nontoxic-appearing 26 old female presenting for evaluation of 3 weeks with respiratory symptoms as outlined HPI above.  She was treated previously with azithromycin and prednisone which did help but her symptoms returned as soon as she finished the medication.  She does have bilateral otitis media on exam as well as marked edema and erythema of her nasal mucosa with yellow nasal discharge.  Tonsillar pillars are edematous and erythematous but I do not appreciate any exudate.  No cervical adenopathy present.  Cardiopulmonary exam reveals: Sounds in all fields.  Patient exam is consistent with a URI with cough and congestion and bilateral otitis media.  I will discharge her home on azithromycin 300 mg twice daily for 7 days for her URI and bilateral otitis media.  Also Atrovent nasal spray to help with nasal congestion.  Tessalon Perles and Promethazine DM  cough syrup for cough and congestion.  Zofran 8 mg every 8 hours as needed for nausea and vomiting.  Return precautions reviewed.  Work note provided.   Final Clinical Impressions(s) / UC Diagnoses   Final diagnoses:  URI with cough and congestion  Non-recurrent acute suppurative otitis media of both ears without spontaneous rupture of tympanic membranes     Discharge Instructions      Take the cefdinir 300 mg twice daily with food for 7 days for treatment of your upper respiratory tract infection and your bilateral ear infection.  Use over-the-counter Tylenol and/or ibuprofen according the package instructions as needed for any fever or pain.  Use the Zofran every 8 hours as needed for nausea and vomiting.  Use the Atrovent nasal spray, 2 squirts in each nostril every 6 hours, as needed for runny nose and postnasal drip.  Use the Tessalon Perles every 8 hours during the day.  Take them with a small sip of water.  They may give you some numbness to the base of your tongue or a metallic taste in your mouth, this is normal.  Use the Promethazine DM cough syrup at bedtime for cough and congestion.  It will make you drowsy so do not take it during the day.  Return for reevaluation or see your primary care provider for any new or worsening symptoms.      ED Prescriptions     Medication Sig Dispense Auth. Provider   benzonatate (TESSALON) 100 MG capsule Take 2 capsules (200 mg total) by mouth every 8 (eight) hours. 21 capsule Kent Pear, NP   cefdinir (OMNICEF) 300 MG capsule Take 1 capsule (300 mg total) by mouth 2 (two) times daily for 7 days. 14 capsule Kent Pear, NP   ipratropium (ATROVENT) 0.06 % nasal spray Place 2 sprays into both nostrils 4 (four) times daily. 15 mL Kent Pear, NP   promethazine-dextromethorphan (PROMETHAZINE-DM) 6.25-15 MG/5ML syrup Take 5 mLs by mouth 4 (four) times daily as needed. 118 mL Kent Pear, NP   ondansetron (ZOFRAN-ODT) 8 MG  disintegrating tablet Take 1 tablet (8 mg total) by mouth every 8 (eight) hours as needed for nausea or vomiting. 20 tablet Kent Pear, NP      PDMP not reviewed this encounter.   Kent Pear, NP 01/07/24 1924

## 2024-01-07 NOTE — ED Triage Notes (Signed)
 Vomiting, sore throat, congestion, ear pain x 3 weeks. Taking mucinex, Theraflu, cough drops, zyrtec and Flonase with no relief of symptoms. Pt states she was prescribed azithromycin and prednisone and states it helped but symptoms came right back after the medication was done.

## 2024-04-08 ENCOUNTER — Encounter: Payer: Self-pay | Admitting: Family Medicine

## 2024-04-08 ENCOUNTER — Ambulatory Visit (INDEPENDENT_AMBULATORY_CARE_PROVIDER_SITE_OTHER): Payer: Self-pay | Admitting: Family Medicine

## 2024-04-08 VITALS — BP 132/70 | HR 57 | Resp 16 | Ht 64.0 in | Wt 202.0 lb

## 2024-04-08 DIAGNOSIS — D500 Iron deficiency anemia secondary to blood loss (chronic): Secondary | ICD-10-CM | POA: Insufficient documentation

## 2024-04-08 DIAGNOSIS — N920 Excessive and frequent menstruation with regular cycle: Secondary | ICD-10-CM

## 2024-04-08 DIAGNOSIS — Z23 Encounter for immunization: Secondary | ICD-10-CM

## 2024-04-08 DIAGNOSIS — Z0000 Encounter for general adult medical examination without abnormal findings: Secondary | ICD-10-CM | POA: Diagnosis not present

## 2024-04-08 DIAGNOSIS — R7303 Prediabetes: Secondary | ICD-10-CM | POA: Diagnosis not present

## 2024-04-08 DIAGNOSIS — Z803 Family history of malignant neoplasm of breast: Secondary | ICD-10-CM | POA: Diagnosis not present

## 2024-04-08 DIAGNOSIS — E66811 Obesity, class 1: Secondary | ICD-10-CM

## 2024-04-08 DIAGNOSIS — Z5181 Encounter for therapeutic drug level monitoring: Secondary | ICD-10-CM

## 2024-04-08 DIAGNOSIS — Z1231 Encounter for screening mammogram for malignant neoplasm of breast: Secondary | ICD-10-CM | POA: Diagnosis not present

## 2024-04-08 DIAGNOSIS — Z6834 Body mass index (BMI) 34.0-34.9, adult: Secondary | ICD-10-CM

## 2024-04-08 LAB — COMPREHENSIVE METABOLIC PANEL WITH GFR
AG Ratio: 1.6 (calc) (ref 1.0–2.5)
ALT: 17 U/L (ref 6–29)
AST: 16 U/L (ref 10–30)
Albumin: 4.3 g/dL (ref 3.6–5.1)
Alkaline phosphatase (APISO): 54 U/L (ref 31–125)
BUN: 11 mg/dL (ref 7–25)
CO2: 24 mmol/L (ref 20–32)
Calcium: 9 mg/dL (ref 8.6–10.2)
Chloride: 105 mmol/L (ref 98–110)
Creat: 0.72 mg/dL (ref 0.50–0.97)
Globulin: 2.7 g/dL (ref 1.9–3.7)
Glucose, Bld: 86 mg/dL (ref 65–99)
Potassium: 3.8 mmol/L (ref 3.5–5.3)
Sodium: 138 mmol/L (ref 135–146)
Total Bilirubin: 0.5 mg/dL (ref 0.2–1.2)
Total Protein: 7 g/dL (ref 6.1–8.1)
eGFR: 111 mL/min/1.73m2 (ref >=60)

## 2024-04-08 LAB — CBC WITH DIFFERENTIAL/PLATELET
Absolute Lymphocytes: 1691 {cells}/uL (ref 850–3900)
Absolute Monocytes: 452 {cells}/uL (ref 200–950)
Basophils Absolute: 32 {cells}/uL (ref 0–200)
Basophils Relative: 0.3 %
Eosinophils Absolute: 63 {cells}/uL (ref 15–500)
Eosinophils Relative: 0.6 %
HCT: 36.9 % (ref 35.0–45.0)
Hemoglobin: 11.6 g/dL — ABNORMAL LOW (ref 11.7–15.5)
MCH: 26 pg — ABNORMAL LOW (ref 27.0–33.0)
MCHC: 31.4 g/dL — ABNORMAL LOW (ref 32.0–36.0)
MCV: 82.6 fL (ref 80.0–100.0)
MPV: 9.1 fL (ref 7.5–12.5)
Monocytes Relative: 4.3 %
Neutro Abs: 8264 {cells}/uL — ABNORMAL HIGH (ref 1500–7800)
Neutrophils Relative %: 78.7 %
Platelets: 356 Thousand/uL (ref 140–400)
RBC: 4.47 Million/uL (ref 3.80–5.10)
RDW: 13.3 % (ref 11.0–15.0)
Total Lymphocyte: 16.1 %
WBC: 10.5 Thousand/uL (ref 3.8–10.8)

## 2024-04-08 LAB — IRON,TIBC AND FERRITIN PANEL
%SAT: 16 % (ref 16–45)
Ferritin: 11 ng/mL — ABNORMAL LOW (ref 16–154)
Iron: 60 ug/dL (ref 40–190)
TIBC: 374 ug/dL (ref 250–450)

## 2024-04-08 LAB — LIPID PANEL
Cholesterol: 140 mg/dL (ref <200)
HDL: 60 mg/dL (ref >=50)
LDL Cholesterol (Calc): 64 mg/dL
Non-HDL Cholesterol (Calc): 80 mg/dL (ref <130)
Total CHOL/HDL Ratio: 2.3 (calc) (ref <5.0)
Triglycerides: 84 mg/dL (ref <150)

## 2024-04-08 LAB — HEMOGLOBIN A1C
Hgb A1c MFr Bld: 5.8 % — ABNORMAL HIGH (ref <5.7)
Mean Plasma Glucose: 120 mg/dL
eAG (mmol/L): 6.6 mmol/L

## 2024-04-08 MED ORDER — NORELGESTROMIN-ETH ESTRADIOL 150-35 MCG/24HR TD PTWK
1.0000 | MEDICATED_PATCH | TRANSDERMAL | 2 refills | Status: DC
Start: 2024-04-08 — End: 2024-04-30

## 2024-04-08 NOTE — Patient Instructions (Addendum)
 Wegovy or zepbound - ask you insurance if they cover for weight loss  Start birth control patch as soon as you get it and follow up with me or Dr. Leonce in 3 months  You are due for pneumococcal shot I will follow up on your breast cancer screening with your family history  Health Maintenance  Topic Date Due   COVID-19 Vaccine (5 - 2024-25 season) 04/23/2024*   Pneumococcal Vaccination (1 of 2 - PCV) 11/19/2024*   Hepatitis B Vaccine (3 of 3 - 19+ 3-dose series) 04/07/2025*   HPV Vaccine (1 - 3-dose SCDM series) 04/07/2025*   Flu Shot  04/23/2024   Pap with HPV screening  01/02/2027   DTaP/Tdap/Td vaccine (2 - Td or Tdap) 07/11/2027   Hepatitis C Screening  Completed   HIV Screening  Completed   Meningitis B Vaccine  Aged Out  *Topic was postponed. The date shown is not the original due date.

## 2024-04-08 NOTE — Progress Notes (Signed)
 Patient: Loretta Jensen, Female    DOB: 31-Dec-1985, 38 y.o.   MRN: [id number] Leavy Mole, PA-C Visit Date: 04/08/2024  Today's Provider: Mole Leavy, PA-C   Chief Complaint  Patient presents with   Annual Exam   Menorrhagia   Subjective:   Annual physical exam:  Loretta Jensen is a 38 y.o. female who presents today for complete physical exam:  Exercise/Activity:  active physical jo, when kids are in school she goes to the gym at least 4 d a week Diet/nutrition:  salads, baked chicken  Sleep:  third shift, sleeps 5-6 hours, interrupted sleep  SDOH Screenings   Food Insecurity: No Food Insecurity (04/08/2024)  Housing: Low Risk  (04/08/2024)  Transportation Needs: No Transportation Needs (04/08/2024)  Utilities: Not At Risk (04/08/2024)  Alcohol Screen: Low Risk  (04/08/2024)  Depression (PHQ2-9): Low Risk  (04/08/2024)  Financial Resource Strain: Low Risk  (04/08/2024)  Physical Activity: Sufficiently Active (04/08/2024)  Social Connections: Socially Isolated (04/08/2024)  Stress: No Stress Concern Present (04/08/2024)  Tobacco Use: Low Risk  (04/08/2024)  Health Literacy: Adequate Health Literacy (04/08/2024)     Pt wished to  f/up on chronic conditions today in addition to CPE - anemia, heavier periods Advised pt of separate visit billing/coding   Discussed the use of AI scribe software for clinical note transcription with the patient, who gave verbal consent to proceed.  History of Present Illness Loretta Jensen is a 38 year old female who presents for an annual physical exam.  Menstrual irregularities and abnormal uterine bleeding - Menstrual periods last up to two weeks, with episodes of spotting and sudden heavy bleeding - Menstrual cycle is regular, but occasionally experiences two periods in a month - No pain during intercourse - No urinary or bowel movement issues - No recent OB GYN visit since last appointment - Considering options to manage  menstrual symptoms  Iron deficiency - Low iron panel in early spring - Takes daily vitamins for low iron levels, worse menorrhagia since then  History of adverse reactions to hormonal contraception - Previously used birth control patch and Depo shot - Discontinued depo shot due to side effects including depression and weight gain She did well with patch in the past, prior OCP she would forget to take  Recent upper respiratory infection - Experienced a double ear infection since last visit, attributed to allergies - No current symptoms  Sleep disturbance - Works third shift - Sleeps five to six hours post-shift, often interrupted when children are home - Does not use sleep aids  Weight management and lifestyle - Maintains an active lifestyle, attends gym four days a week when children are in school - Diet includes salads and baked chicken, with many foods eliminated - Seeking weight loss options after unsuccessful attempts with diet and lifestyle changes   USPSTF grade A and B recommendations - reviewed and addressed today  Depression:  Phq 9 completed today by patient, was reviewed by me with patient in the room PHQ score is neg, pt feels mood is good    04/08/2024    8:35 AM 06/12/2023    8:11 AM 03/14/2023   10:53 AM 03/10/2023    9:25 AM  PHQ 2/9 Scores  PHQ - 2 Score 0 0 0 0  PHQ- 9 Score  0 0 0      04/08/2024    8:35 AM 06/12/2023    8:11 AM 03/14/2023   10:53 AM 03/10/2023    9:25 AM 03/03/2023  1:16 PM  Depression screen PHQ 2/9  Decreased Interest 0 0 0 0 0  Down, Depressed, Hopeless 0 0 0 0 0  PHQ - 2 Score 0 0 0 0 0  Altered sleeping  0 0 0   Tired, decreased energy  0 0 0   Change in appetite  0 0 0   Feeling bad or failure about yourself   0 0 0   Trouble concentrating  0 0 0   Moving slowly or fidgety/restless  0 0 0   Suicidal thoughts  0 0 0   PHQ-9 Score  0 0 0   Difficult doing work/chores  Not difficult at all Not difficult at all Not difficult  at all     Alcohol screening: Flowsheet Row Office Visit from 04/08/2024 in [facility]  AUDIT-C Score 0    Immunizations and Health Maintenance: Health Maintenance  Topic Date Due   COVID-19 Vaccine (5 - 2024-25 season) 04/23/2024 (Originally 05/25/2023)   Pneumococcal Vaccine 56-63 Years old (1 of 2 - PCV) 11/19/2024 (Originally 08/09/2006)   Hepatitis B Vaccines (3 of 3 - 19+ 3-dose series) 04/07/2025 (Originally 08/15/2021)   HPV VACCINES (1 - 3-dose SCDM series) 04/07/2025 (Originally 08/09/2014)   INFLUENZA VACCINE  04/23/2024   Cervical Cancer Screening (HPV/Pap Cotest)  01/02/2027   DTaP/Tdap/Td (2 - Td or Tdap) 07/11/2027   Hepatitis C Screening  Completed   HIV Screening  Completed   Meningococcal B Vaccine  Aged Out     Hep C Screening: done  STD testing and prevention (HIV/chl/gon/syphilis):  see above, no additional testing desired by pt today  Intimate partner violence:   safe, denies abuse  Sexual History/Pain during Intercourse: Single, lives with kids and justin not really together or sexually active together  Menstrual History/LMP/Abnormal Bleeding: heavy bleeding, regular cycle Patient's last menstrual period was 04/07/2024.  Incontinence Symptoms: none  Breast cancer: asks about doing younger screening due to family hx - will review past genetic recommendations and testing, last done 2022 Last Mammogram: *see HM list above BRCA gene screening: neg  Cervical cancer screening: UTD, due in 2028   Osteoporosis:   Discussion on osteoporosis per age, including high calcium and vitamin D supplementation, weight bearing exercises N/a due to age  Skin cancer:  Hx of skin CA -  NO Discussed atypical lesions   Colorectal cancer:   Colonoscopy is not due per age   Discussed concerning signs and sx of CRC  Lung cancer:   Low Dose CT Chest recommended if Age 34-80 years, 20 pack-year currently smoking OR have quit w/in 15years.  Patient does not qualify.    Social History   Tobacco Use   Smoking status: Never   Smokeless tobacco: Never  Vaping Use   Vaping status: Never Used  Substance Use Topics   Alcohol use: Yes    Alcohol/week: 0.0 standard drinks of alcohol    Comment: occasionally   Drug use: No     Flowsheet Row Office Visit from 04/08/2024 in [facility]  AUDIT-C Score 0    Family History  Problem Relation Age of Onset   Breast cancer Mother 72   Cancer Mother    Liver cancer Maternal Grandfather 17   Cancer Maternal Grandfather    Stomach cancer Paternal Grandmother    Breast cancer Other 24       2018 - neg [facility]   Breast cancer Other 91  2013   Breast cancer Other        1990s   Breast cancer Other      Blood pressure/Hypertension: BP Readings from Last 3 Encounters:  04/08/24 132/70  01/07/24 131/73  12/10/23 128/76    Weight/Obesity: Wt Readings from Last 3 Encounters:  04/08/24 202 lb (91.6 kg)  12/10/23 202 lb (91.6 kg)  11/20/23 207 lb (93.9 kg)   BMI Readings from Last 3 Encounters:  04/08/24 34.67 kg/m  12/10/23 34.67 kg/m  11/20/23 35.53 kg/m     Lipids:  Lab Results  Component Value Date   CHOL 130 03/10/2023   CHOL 121 01/01/2022   CHOL 113 04/23/2019   Lab Results  Component Value Date   HDL 69 03/10/2023   HDL 53 01/01/2022   HDL 47 (L) 04/23/2019   Lab Results  Component Value Date   LDLCALC 47 03/10/2023   LDLCALC 54 01/01/2022   LDLCALC 52 04/23/2019   Lab Results  Component Value Date   TRIG 61 03/10/2023   TRIG 63 01/01/2022   TRIG 68 04/23/2019   Lab Results  Component Value Date   CHOLHDL 1.9 03/10/2023   CHOLHDL 2.3 01/01/2022   CHOLHDL 2.4 04/23/2019   No results found for: LDLDIRECT Based on the results of lipid panel his/her cardiovascular risk factor ( using [facility] )  in the next 10 years is: The ASCVD Risk score (Arnett DK, et al., 2019) failed to calculate for the  following reasons:   The 2019 ASCVD risk score is only valid for ages 33 to 81  Glucose:  Glucose, Bld  Date Value Ref Range Status  12/10/2023 87 65 - 99 mg/dL Final    Comment:    .            Fasting reference interval .   03/10/2023 84 65 - 99 mg/dL Final    Comment:    .            Fasting reference interval .   06/27/2022 102 (H) 70 - 99 mg/dL Final    Comment:    Glucose reference range applies only to samples taken after fasting for at least 8 hours.    Advanced Care Planning:  A voluntary discussion about advance care planning including the explanation and discussion of advance directives.     Social History       Social History   Socioeconomic History   Marital status: Single    Spouse name: Not on file   Number of children: 3   Years of education: 13   Highest education level: Not on file  Occupational History   Occupation: laminate and crush foam    Comment: [employer]  Tobacco Use   Smoking status: Never   Smokeless tobacco: Never  Vaping Use   Vaping status: Never Used  Substance and Sexual Activity   Alcohol use: Yes    Alcohol/week: 0.0 standard drinks of alcohol    Comment: occasionally   Drug use: No   Sexual activity: Yes    Partners: Male    Birth control/protection: Surgical  Other Topics Concern   Not on file  Social History Narrative   Not on file   Social Drivers of Health   Financial Resource Strain: Low Risk  (04/08/2024)   Overall Financial Resource Strain (CARDIA)    Difficulty of Paying Living Expenses: Not hard at all  Food Insecurity: No Food Insecurity (04/08/2024)   Hunger Vital Sign    Worried  About Running Out of Food in the Last Year: Never true    Ran Out of Food in the Last Year: Never true  Transportation Needs: No Transportation Needs (04/08/2024)   PRAPARE - Administrator, Civil Service (Medical): No    Lack of Transportation (Non-Medical): No  Physical Activity: Sufficiently Active (04/08/2024)    Exercise Vital Sign    Days of Exercise per Week: 4 days    Minutes of Exercise per Session: 60 min  Stress: No Stress Concern Present (04/08/2024)   [employer] of Occupational Health - Occupational Stress Questionnaire    Feeling of Stress: Only a little  Social Connections: Socially Isolated (04/08/2024)   Social Connection and Isolation Panel    Frequency of Communication with Friends and Family: Once a week    Frequency of Social Gatherings with Friends and Family: Once a week    Attends Religious Services: 1 to 4 times per year    Active Member of [employer] or Organizations: No    Attends Engineer, structural: Never    Marital Status: Never married    Family History        Family History  Problem Relation Age of Onset   Breast cancer Mother 63   Cancer Mother    Liver cancer Maternal Grandfather 43   Cancer Maternal Grandfather    Stomach cancer Paternal Grandmother    Breast cancer Other 72       2018 - neg [facility]   Breast cancer Other 55       2013   Breast cancer Other        1990s   Breast cancer Other     Patient Active Problem List   Diagnosis Date Noted   Moderate asthma with exacerbation 10/10/2023   Gastroesophageal reflux disease 10/10/2023   Genetic testing 05/19/2023   Family history of thyroid disease 03/03/2023   Reactive airway disease 03/17/2020   Chronic midline low back pain without sciatica 03/17/2020   Family history of breast cancer    Seasonal allergic rhinitis 01/19/2019   Family history of breast cancer in mother 01/19/2019   Menorrhagia with irregular cycle 01/19/2019   BMI 34.0-34.9,adult 03/04/2018   Migraine headache 01/23/2018   Occupational exposure to dust 05/23/2015    Past Surgical History:  Procedure Laterality Date   CESAREAN SECTION  07/10/2018   CESAREAN SECTION N/A 03/02/2017   Procedure: CESAREAN SECTION;  Surgeon: Leonce Garnette BIRCH, MD;  Location: [facility] ORS;  Service: Obstetrics;  Laterality: N/A;    CESAREAN SECTION N/A 07/10/2018   Procedure: CESAREAN SECTION;  Surgeon: Leonce Garnette BIRCH, MD;  Location: [facility] ORS;  Service: Obstetrics;  Laterality: N/A;   TUBAL LIGATION Bilateral 07/10/2018   Procedure: BILATERAL TUBAL LIGATION;  Surgeon: Leonce Garnette BIRCH, MD;  Location: [facility] ORS;  Service: Obstetrics;  Laterality: Bilateral;     Current Outpatient Medications:    albuterol  (VENTOLIN  HFA) 108 (90 Base) MCG/ACT inhaler, Inhale 2 puffs into the lungs every 4 (four) hours as needed for wheezing or shortness of breath., Disp: 2 each, Rfl: 2   cetirizine  (ZYRTEC ) 10 MG tablet, Take 1 tablet (10 mg total) by mouth daily., Disp: 30 tablet, Rfl: 11   famotidine  (PEPCID ) 20 MG tablet, Take 1 tablet (20 mg total) by mouth daily., Disp: 30 tablet, Rfl: 1   fluticasone  (FLONASE ) 50 MCG/ACT nasal spray, Place 2 sprays into both nostrils daily., Disp: 16 g, Rfl: 6   Fluticasone -Umeclidin-Vilant (TRELEGY ELLIPTA ) 100-62.5-25 MCG/ACT  AEPB, Inhale 1 puff into the lungs daily., Disp: 3 each, Rfl: 3   ibuprofen  (ADVIL ) 600 MG tablet, TAKE 1 TABLET BY MOUTH EVERY 8 HOURS AS NEEDED FOR HEADACHE, MILD TO MODERATE PAIN OR CRAMPING, Disp: 90 tablet, Rfl: 0   ipratropium (ATROVENT ) 0.06 % nasal spray, Place 2 sprays into both nostrils 4 (four) times daily., Disp: 15 mL, Rfl: 12   ipratropium-albuterol  (DUONEB) 0.5-2.5 (3) MG/3ML SOLN, Take 3 mLs by nebulization 3 (three) times daily as needed., Disp: 360 mL, Rfl: 1   levocetirizine (XYZAL ) 5 MG tablet, Take 1 tablet (5 mg total) by mouth every evening., Disp: 90 tablet, Rfl: 0   montelukast  (SINGULAIR ) 10 MG tablet, Take 1 tablet (10 mg total) by mouth at bedtime., Disp: 90 tablet, Rfl: 0   Olopatadine  HCl 0.2 % SOLN, Use 1 drop in each eye BID, Disp: 2.5 mL, Rfl: 0   ondansetron  (ZOFRAN -ODT) 8 MG disintegrating tablet, Take 1 tablet (8 mg total) by mouth every 8 (eight) hours as needed for nausea or vomiting., Disp: 20 tablet, Rfl: 0   sucralfate  (CARAFATE ) 1 g  tablet, Take 1 tablet (1 g total) by mouth 4 (four) times daily., Disp: 60 tablet, Rfl: 0  Allergies  Allergen Reactions   Metronidazole  Nausea And Vomiting    TID dose caused N/V    Patient Care Team: Josias Tomerlin, PA-C as PCP - General (Family Medicine)   Chart Review: I personally reviewed active problem list, medication list, allergies, family history, social history, health maintenance, notes from last encounter, lab results, imaging with the patient/caregiver today.   Review of Systems  Constitutional: Negative.   HENT: Negative.    Eyes: Negative.   Respiratory: Negative.    Cardiovascular: Negative.   Gastrointestinal: Negative.   Endocrine: Negative.   Genitourinary: Negative.   Musculoskeletal: Negative.   Skin: Negative.   Allergic/Immunologic: Negative.   Neurological: Negative.   Hematological: Negative.   Psychiatric/Behavioral: Negative.    All other systems reviewed and are negative.         Objective:   Vitals:  Vitals:   04/08/24 0837  BP: 132/70  Pulse: (!) 57  Resp: 16  SpO2: 96%  Weight: 202 lb (91.6 kg)  Height: 5' 4 (1.626 m)    Body mass index is 34.67 kg/m.  Physical Exam Vitals and nursing note reviewed.  Constitutional:      General: She is not in acute distress.    Appearance: Normal appearance. She is well-developed. She is obese. She is not ill-appearing, toxic-appearing or diaphoretic.  HENT:     Head: Normocephalic and atraumatic.     Right Ear: Tympanic membrane, ear canal and external ear normal. There is no impacted cerumen.     Left Ear: Tympanic membrane, ear canal and external ear normal. There is no impacted cerumen.     Nose: Nose normal.     Mouth/Throat:     Mouth: Mucous membranes are moist.     Pharynx: Oropharynx is clear. No oropharyngeal exudate or posterior oropharyngeal erythema.  Eyes:     General: No scleral icterus.       Right eye: No discharge.        Left eye: No discharge.      Conjunctiva/sclera: Conjunctivae normal.  Neck:     Trachea: No tracheal deviation.  Cardiovascular:     Rate and Rhythm: Regular rhythm. Bradycardia present.     Pulses: Normal pulses.     Heart sounds: Normal heart sounds.  Pulmonary:     Effort: Pulmonary effort is normal. No respiratory distress.     Breath sounds: Normal breath sounds. No stridor. No wheezing, rhonchi or rales.  Abdominal:     General: Bowel sounds are normal.     Palpations: Abdomen is soft.     Tenderness: There is no right CVA tenderness or left CVA tenderness.  Skin:    General: Skin is warm and dry.     Findings: No rash.  Neurological:     Mental Status: She is alert.     Motor: No abnormal muscle tone.     Coordination: Coordination normal.     Gait: Gait normal.  Psychiatric:        Mood and Affect: Mood normal.        Behavior: Behavior normal.       Fall Risk:    04/08/2024    8:35 AM 06/12/2023    8:11 AM 03/14/2023   10:53 AM 03/10/2023    9:25 AM 03/03/2023    1:15 PM  Fall Risk   Falls in the past year? 0 0 0 0 0  Number falls in past yr: 0 0 0 0 0  Injury with Fall? 0 0 0 0 0  Risk for fall due to : No Fall Risks No Fall Risks No Fall Risks No Fall Risks   Follow up Falls prevention discussed Falls prevention discussed;Education provided;Falls evaluation completed Falls prevention discussed;Education provided;Falls evaluation completed Falls prevention discussed;Education provided;Falls evaluation completed     Functional Status Survey: Is the patient deaf or have difficulty hearing?: No Does the patient have difficulty seeing, even when wearing glasses/contacts?: No Does the patient have difficulty concentrating, remembering, or making decisions?: No Does the patient have difficulty walking or climbing stairs?: No Does the patient have difficulty dressing or bathing?: No Does the patient have difficulty doing errands alone such as visiting a doctor's office or shopping?:  No   Assessment & Plan:    CPE completed today  USPSTF grade A and B recommendations reviewed with patient; age-appropriate recommendations, preventive care, screening tests, etc discussed and encouraged; healthy living encouraged; see AVS for patient education given to patient  Discussed importance of 150 minutes of physical activity weekly, AHA exercise recommendations given to pt in AVS/handout  Discussed importance of healthy diet:  eating lean meats and proteins, avoiding trans fats and saturated fats, avoid simple sugars and excessive carbs in diet, eat 6 servings of fruit/vegetables daily and drink plenty of water and avoid sweet beverages.    Recommended pt to do annual eye exam and routine dental exams/cleanings  Depression, alcohol, fall screening completed as documented above and per flowsheets  Advance Care planning information and packet discussed and offered today, encouraged pt to discuss with family members/spouse/partner/friends and complete Advanced directive packet and bring copy to office   Reviewed Health Maintenance: Health Maintenance  Topic Date Due   COVID-19 Vaccine (5 - 2024-25 season) 04/23/2024 (Originally 05/25/2023)   Pneumococcal Vaccine 27-33 Years old (1 of 2 - PCV) 11/19/2024 (Originally 08/09/2006)   Hepatitis B Vaccines (3 of 3 - 19+ 3-dose series) 04/07/2025 (Originally 08/15/2021)   HPV VACCINES (1 - 3-dose SCDM series) 04/07/2025 (Originally 08/09/2014)   INFLUENZA VACCINE  04/23/2024   Cervical Cancer Screening (HPV/Pap Cotest)  01/02/2027   DTaP/Tdap/Td (2 - Td or Tdap) 07/11/2027   Hepatitis C Screening  Completed   HIV Screening  Completed   Meningococcal B Vaccine  Aged Out  Immunizations: Immunization History  Administered Date(s) Administered   Hepatitis B, ADULT 07/10/2017, 06/20/2021   Influenza,inj,Quad PF,6+ Mos 09/01/2015, 07/09/2019, 06/13/2022   Influenza-Unspecified 07/10/2017, 06/17/2018, 07/09/2019, 06/13/2020,  06/20/2021, 06/13/2022   Moderna Sars-Covid-2 Vaccination 03/22/2020, 04/21/2020, 05/19/2020   Pfizer Covid-19 Vaccine Bivalent Booster 38yrs & up 06/19/2021   Rho (D) Immune Globulin  12/13/2016   Tdap 07/10/2017   Vaccines:  HPV: up to at age 85 , ask insurance if age between 72-45  Pneumonia: RECOMMENDED-  educated and discussed with patient. Flu: recommended annually -0 educated and discussed with patient.     ICD-10-CM   1. Well adult exam  Z00.00 HgB A1c    Comprehensive Metabolic Panel (CMET)    CBC with Differential/Platelet    Lipid Profile    2. Iron deficiency anemia due to chronic blood loss  D50.0 CBC with Differential/Platelet    Fe+TIBC+Fer    Ambulatory referral to Obstetrics / Gynecology   on OTC multivitamin with iron, she previously did not want any intervention with gyn or hematology, worse menses, recheck    3. Prediabetes  R73.03 HgB A1c    Comprehensive Metabolic Panel (CMET)   recheck    4. Menorrhagia with regular cycle  N92.0 Ambulatory referral to Obstetrics / Gynecology    norelgestromin -ethinyl estradiol  (XULANE) 150-35 MCG/24HR transdermal patch   after thorough chart review again of past meds, no contraindication to OCP, she did well with patch previously, continuous patch trial    5. Encounter for screening mammogram for malignant neoplasm of breast  Z12.31 MM 3D SCREENING MAMMOGRAM BILATERAL BREAST    6. Family history of breast cancer in mother  Z40.3 MM 3D SCREENING MAMMOGRAM BILATERAL BREAST   diagnosed at age 63, died at age 80, genetic counselor and eval recommends pt do screening 10 year prior to family member dx    7. Need for pneumococcal 20-valent conjugate vaccination  Z23    declined today says she will get with nurse visit    8. Class 1 obesity with body mass index (BMI) of 34.0 to 34.9 in adult, unspecified obesity type, unspecified whether serious comorbidity present  E66.811    Z68.34       Breast CA screening family members  with early breast CA, sister doing screening every 6 months pt has done past mammo and genetic counseling    OCP/menorrhagia  Past deep dive: Reviewed chart as much as I could - pt and prior notes only state pt was told previously by Dr. Leonce not to do OCP due to blood clots - pt states this referred to large clots during menses - she has not hx of DVT/TVE She had tried OCP in the past for birth control and she took it irregularly, she also previously used [facility] patch and liked it but states she thinks it was recalled and that's why she had to stop use She has mild hx of HA's - she reports no hx of migraines and no problem in the past with HA and OCP She has never tried continuous OCP for menorrhagia/chronic blood loss anemia Prior PCP did address this and instructed pt to f/up with Dr. Leonce to discuss workup and tx for heavy menses    Assessment & Plan Menorrhagia with regular cycle Discussed hormonal options due to severe symptoms. Considered uterine polyps or fibroids. Birth control patch as management option. Explained risks and benefits of hormonal therapy. - Refer to [facility] for evaluation, potential ultrasound, and procedures. - Prescribe birth control patch for  immediate use, continuous for three months. - Monitor for symptom changes or patch side effects.  Iron deficiency anemia due to chronic blood loss Mild anemia with low iron panel. Started vitamins. Plan to reassess iron stores. - Recheck iron stores to evaluate vitamin supplementation effectiveness.  Weight management Discussed weight loss medications and insurance criteria. Emphasized diet and lifestyle changes. Explained medication side effects and need for ongoing lifestyle changes. Engineer, maintenance (IT) insurance for coverage of weight loss medications like Wegovy or Zepbound. - Document weight, BMI, and previous diet and lifestyle changes for insurance approval.  General Health Maintenance Cervical cancer screening current.  Family history of breast cancer. Connected with Dentist. Consider earlier mammogram. Pneumococcal vaccine recommended due to asthma and potential diabetes. - Review genetic counselor's notes and family history for mammogram timing. - Administer pneumococcal vaccine at future or nurse visit.  Recording duration: 27 minutes   Return in about 3 months (around 07/09/2024) for birth control, periods iron deficiency anemia.   Michelene Cower, PA-C 04/08/24 9:00 AM  [facility]

## 2024-04-09 ENCOUNTER — Ambulatory Visit: Payer: Self-pay | Admitting: Family Medicine

## 2024-04-22 ENCOUNTER — Encounter: Payer: Self-pay | Admitting: Family Medicine

## 2024-04-30 MED ORDER — NORETHIN ACE-ETH ESTRAD-FE 1-20 MG-MCG PO TABS: 1.0000 | ORAL_TABLET | Freq: Every day | ORAL | 1 refills | Status: DC

## 2024-04-30 NOTE — Addendum Note (Signed)
 Addended by: Amica Harron on: 04/30/2024 04:35 PM   Modules accepted: Orders

## 2024-05-25 ENCOUNTER — Encounter: Payer: Self-pay | Admitting: Family Medicine

## 2024-05-30 ENCOUNTER — Ambulatory Visit
Admission: EM | Admit: 2024-05-30 | Discharge: 2024-05-30 | Disposition: A | Discharge status: Home / Self Care | Attending: Family Medicine | Admitting: Family Medicine

## 2024-05-30 DIAGNOSIS — J069 Acute upper respiratory infection, unspecified: Secondary | ICD-10-CM | POA: Insufficient documentation

## 2024-05-30 DIAGNOSIS — R001 Bradycardia, unspecified: Secondary | ICD-10-CM | POA: Insufficient documentation

## 2024-05-30 LAB — SARS CORONAVIRUS 2 BY RT PCR: SARS Coronavirus 2 by RT PCR: NEGATIVE

## 2024-05-30 LAB — GROUP A STREP BY PCR: Group A Strep by PCR: NOT DETECTED

## 2024-05-30 MED ORDER — PROMETHAZINE-DM 6.25-15 MG/5ML PO SYRP: 5.0000 mL | ORAL_SOLUTION | Freq: Four times a day (QID) | ORAL | 0 refills | Status: DC | PRN

## 2024-05-30 NOTE — Discharge Instructions (Addendum)
 Loretta Jensen's strep and COVID are all negative. You have a viral respiratory infection that will gradually improve over the next 7-10 days. Cough may last up to 3 weeks.   You can take Tylenol  and/or Ibuprofen  as needed for fever reduction and pain relief.    For cough: honey 1/2 to 1 teaspoon (you can dilute the honey in water or another fluid).  Stop at the pharmacy to pick up your prescription cough medication. You can use a humidifier for chest congestion and cough.  If you don't have a humidifier, you can sit in the bathroom with the hot shower running.      For sore throat: try warm salt water gargles, Mucinex  sore throat cough drops or cepacol lozenges, throat spray, warm tea or water with lemon/honey, popsicles or ice, or OTC cold relief medicine for throat discomfort. You can also purchase chloraseptic spray at the pharmacy or dollar store.   For congestion: take a daily anti-histamine like Zyrtec , Claritin , and a oral decongestant, such as pseudoephedrine .  You can also use Flonase  1-2 sprays in each nostril daily. Afrin is also a good option, if you do not have high blood pressure.    It is important to stay hydrated: drink plenty of fluids (water, gatorade/powerade/pedialyte, juices, or teas) to keep your throat moisturized and help further relieve irritation/discomfort.    Return or go to the Emergency Department if symptoms worsen or do not improve in the next few days

## 2024-05-30 NOTE — ED Provider Notes (Signed)
 [facility]    CSN: [id number] Arrival date & time: 05/30/24  1330      History   Chief Complaint Chief Complaint  Patient presents with   Sore Throat    HPI Skyler Dusing is a 38 y.o. female.   HPI  History obtained from the patient. Ivey presents for sore throat with painful swallowing, bilateral ear pain, headache, cough, diarrhea and belly pain that started 3 days ago. Has been feeling warm. No known fever as she didn't take her temperature. Denies rhinorrhea, nasal congestion and vomiting.  Tried allergy medication and Tylenol . Her daughter had an ear infection. Her kids have been around the sick kids at school.   Has asthma and needed to use her inhaler before work yesterday.  Denies vaping and smoking.     Past Medical History:  Diagnosis Date   Allergy    COVID 10/05/2020   Family history of breast cancer    Headache    History of cesarean delivery 12/04/2016   Obesity affecting pregnancy 03/04/2018   Rh negative state in antepartum period 07/09/2018   Short interval between pregnancies affecting pregnancy, antepartum 01/19/2018   Status post cesarean delivery 03/02/2017   Type O blood, Rh negative     Patient Active Problem List   Diagnosis Date Noted   Prediabetes 04/08/2024   Iron deficiency anemia due to chronic blood loss 04/08/2024   Moderate asthma with exacerbation 10/10/2023   Gastroesophageal reflux disease 10/10/2023   Genetic testing 05/19/2023   Family history of thyroid disease 03/03/2023   Reactive airway disease 03/17/2020   Chronic midline low back pain without sciatica 03/17/2020   Family history of breast cancer    Seasonal allergic rhinitis 01/19/2019   Family history of breast cancer in mother 01/19/2019   Menorrhagia with regular cycle 01/19/2019   Class 1 obesity with body mass index (BMI) of 34.0 to 34.9 in adult 03/04/2018   BMI 34.0-34.9,adult 03/04/2018   Migraine headache 01/23/2018   Occupational exposure  to dust 05/23/2015    Past Surgical History:  Procedure Laterality Date   CESAREAN SECTION  07/10/2018   CESAREAN SECTION N/A 03/02/2017   Procedure: CESAREAN SECTION;  Surgeon: Leonce Garnette BIRCH, MD;  Location: [facility];  Service: Obstetrics;  Laterality: N/A;   CESAREAN SECTION N/A 07/10/2018   Procedure: CESAREAN SECTION;  Surgeon: Leonce Garnette BIRCH, MD;  Location: [facility];  Service: Obstetrics;  Laterality: N/A;   TUBAL LIGATION Bilateral 07/10/2018   Procedure: BILATERAL TUBAL LIGATION;  Surgeon: Leonce Garnette BIRCH, MD;  Location: [facility];  Service: Obstetrics;  Laterality: Bilateral;    OB History     Gravida  3   Para  3   Term  3   Preterm      AB      Living  3      SAB      IAB      Ectopic      Multiple  0   Live Births  3            Home Medications    Prior to Admission medications   Medication Sig Start Date End Date Taking? Authorizing Provider  promethazine -dextromethorphan (PROMETHAZINE -DM) 6.25-15 MG/5ML syrup Take 5 mLs by mouth 4 (four) times daily as needed. 05/30/24  Yes Lynne Righi, DO  albuterol  (VENTOLIN  HFA) 108 (90 Base) MCG/ACT inhaler Inhale 2 puffs into the lungs every 4 (four) hours as needed for wheezing or shortness of breath. 04/05/22  Tapia, Leisa, PA-C  cetirizine  (ZYRTEC ) 10 MG tablet Take 1 tablet (10 mg total) by mouth daily. 11/05/22   Tapia, Leisa, PA-C  famotidine  (PEPCID ) 20 MG tablet Take 1 tablet (20 mg total) by mouth daily. 06/27/22 04/08/24  Floy Roberts, MD  fluticasone  (FLONASE ) 50 MCG/ACT nasal spray Place 2 sprays into both nostrils daily. 01/19/19   Lavina Damien BRAVO, FNP  Fluticasone -Umeclidin-Vilant (TRELEGY ELLIPTA ) 100-62.5-25 MCG/ACT AEPB Inhale 1 puff into the lungs daily. 03/14/23   Tapia, Leisa, PA-C  ibuprofen  (ADVIL ) 600 MG tablet TAKE 1 TABLET BY MOUTH EVERY 8 HOURS AS NEEDED FOR HEADACHE, MILD TO MODERATE PAIN OR CRAMPING 03/11/23   Tapia, Leisa, PA-C  ipratropium (ATROVENT ) 0.06 % nasal spray  Place 2 sprays into both nostrils 4 (four) times daily. 01/07/24   Bernardino Ditch, NP  ipratropium-albuterol  (DUONEB) 0.5-2.5 (3) MG/3ML SOLN Take 3 mLs by nebulization 3 (three) times daily as needed. 12/02/22   Tapia, Leisa, PA-C  levocetirizine (XYZAL ) 5 MG tablet Take 1 tablet (5 mg total) by mouth every evening. 10/10/23   Mecum, Erin E, PA-C  montelukast  (SINGULAIR ) 10 MG tablet Take 1 tablet (10 mg total) by mouth at bedtime. 10/10/23   Mecum, Erin E, PA-C  norethindrone -ethinyl estradiol -FE (JUNEL FE 1/20) 1-20 MG-MCG tablet Take 1 tablet by mouth daily. 04/30/24   Tapia, Leisa, PA-C  Olopatadine  HCl 0.2 % SOLN Use 1 drop in each eye BID 01/18/21   Burnette, Jennifer M, PA-C  ondansetron  (ZOFRAN -ODT) 8 MG disintegrating tablet Take 1 tablet (8 mg total) by mouth every 8 (eight) hours as needed for nausea or vomiting. 01/07/24   Bernardino Ditch, NP  sucralfate  (CARAFATE ) 1 g tablet Take 1 tablet (1 g total) by mouth 4 (four) times daily. 06/27/22   Floy Roberts, MD    Family History Family History  Problem Relation Age of Onset   Breast cancer Mother 67   Cancer Mother    Liver cancer Maternal Grandfather 72   Cancer Maternal Grandfather    Stomach cancer Paternal Grandmother    Breast cancer Other 20       2018 - neg [facility]   Breast cancer Other 55       2013   Breast cancer Other        1990s   Breast cancer Other     Social History Social History   Tobacco Use   Smoking status: Never   Smokeless tobacco: Never  Vaping Use   Vaping status: Never Used  Substance Use Topics   Alcohol use: Yes    Alcohol/week: 0.0 standard drinks of alcohol    Comment: occasionally   Drug use: No     Allergies   Metronidazole    Review of Systems Review of Systems: negative unless otherwise stated in HPI.      Physical Exam Triage Vital Signs ED Triage Vitals  Encounter Vitals Group     BP 05/30/24 1334 134/86     Girls Systolic BP Percentile --      Girls Diastolic BP  Percentile --      Boys Systolic BP Percentile --      Boys Diastolic BP Percentile --      Pulse Rate 05/30/24 1334 (!) 52     Resp 05/30/24 1334 16     Temp 05/30/24 1334 98.4 F (36.9 C)     Temp Source 05/30/24 1334 Oral     SpO2 05/30/24 1334 98 %     Weight 05/30/24 1333 205  lb (93 kg)     Height 05/30/24 1333 5' 4 (1.626 m)     Head Circumference --      Peak Flow --      Pain Score 05/30/24 1337 10     Pain Loc --      Pain Education --      Exclude from Growth Chart --    No data found.  Updated Vital Signs BP 134/86 (BP Location: Right Arm)   Pulse (!) 52   Temp 98.4 F (36.9 C) (Oral)   Resp 16   Ht 5' 4 (1.626 m)   Wt 93 kg   LMP 05/29/2024 (Exact Date)   SpO2 98%   BMI 35.19 kg/m   Visual Acuity Right Eye Distance:   Left Eye Distance:   Bilateral Distance:    Right Eye Near:   Left Eye Near:    Bilateral Near:     Physical Exam GEN:     alert, non-toxic appearing female in no distress    HENT:  mucus membranes moist, oropharyngeal without lesions, mild erythema, no tonsillar hypertrophy or exudates,  clear nasal discharge, bilateral TM normal EYES:   pupils equal and reactive, no scleral injection or discharge NECK:  normal ROM, no lymphadenopathy, no meningismus   RESP:  no increased work of breathing, clear to auscultation bilaterally CVS:   regular rhythm, bradycardia  Skin:   warm and dry, no rash on visible skin    UC Treatments / Results  Labs (all labs ordered are listed, but only abnormal results are displayed) Labs Reviewed  GROUP A STREP BY PCR  SARS CORONAVIRUS 2 BY RT PCR    EKG   Radiology No results found.   Procedures Procedures (including critical care time)  Medications Ordered in UC Medications - No data to display  Initial Impression / Assessment and Plan / UC Course  I have reviewed the triage vital signs and the nursing notes.  Pertinent labs & imaging results that were available during my care of the  patient were reviewed by me and considered in my medical decision making (see chart for details).       Pt is a 38 y.o. female who presents for 3 days of GI and respiratory symptoms. Tymesha is afebrile here. She is bradycardic and per chart review and pt this is new. Recommended EKG but she would rather follow up with her PCP about her heart rate/  Satting well on room air. Overall pt is ill but non-toxic appearing, well hydrated, without respiratory distress. Pulmonary exam is unremarkable.  COVID obtained and was negative. Strep PCR is negative.   Suspect viral respiratory illness. Discussed symptomatic treatment.  Explained lack of efficacy of antibiotics in viral disease.  Promethazine  DM for cough. Typical duration of symptoms discussed.   Return and ED precautions given and voiced understanding. Discussed MDM, treatment plan and plan for follow-up with patient who agrees with plan.     Final Clinical Impressions(s) / UC Diagnoses   Final diagnoses:  Viral URI with cough  Bradycardia     Discharge Instructions      Nahomy Castell's strep and COVID are all negative. You have a viral respiratory infection that will gradually improve over the next 7-10 days. Cough may last up to 3 weeks.   You can take Tylenol  and/or Ibuprofen  as needed for fever reduction and pain relief.    For cough: honey 1/2 to 1 teaspoon (you can dilute the honey in water  or another fluid).  Stop at the pharmacy to pick up your prescription cough medication. You can use a humidifier for chest congestion and cough.  If you don't have a humidifier, you can sit in the bathroom with the hot shower running.      For sore throat: try warm salt water gargles, Mucinex  sore throat cough drops or cepacol lozenges, throat spray, warm tea or water with lemon/honey, popsicles or ice, or OTC cold relief medicine for throat discomfort. You can also purchase chloraseptic spray at the pharmacy or dollar store.   For  congestion: take a daily anti-histamine like Zyrtec , Claritin , and a oral decongestant, such as pseudoephedrine .  You can also use Flonase  1-2 sprays in each nostril daily. Afrin is also a good option, if you do not have high blood pressure.    It is important to stay hydrated: drink plenty of fluids (water, gatorade/powerade/pedialyte, juices, or teas) to keep your throat moisturized and help further relieve irritation/discomfort.    Return or go to the Emergency Department if symptoms worsen or do not improve in the next few days      ED Prescriptions     Medication Sig Dispense Auth. Provider   promethazine -dextromethorphan (PROMETHAZINE -DM) 6.25-15 MG/5ML syrup Take 5 mLs by mouth 4 (four) times daily as needed. 118 mL Leith Hedlund, DO      PDMP not reviewed this encounter.   Doni Widmer, DO 06/02/24 [id number]

## 2024-05-30 NOTE — ED Triage Notes (Signed)
 Pt c/o sore throat & HA x3 days. Has tried tylenol  w/o relief.

## 2024-06-21 ENCOUNTER — Ambulatory Visit (INDEPENDENT_AMBULATORY_CARE_PROVIDER_SITE_OTHER): Admitting: Nurse Practitioner

## 2024-06-21 ENCOUNTER — Encounter: Payer: Self-pay | Admitting: Nurse Practitioner

## 2024-06-21 VITALS — BP 124/72 | HR 82 | Resp 16 | Ht 64.0 in | Wt 204.0 lb

## 2024-06-21 DIAGNOSIS — F419 Anxiety disorder, unspecified: Secondary | ICD-10-CM | POA: Diagnosis not present

## 2024-06-21 DIAGNOSIS — F331 Major depressive disorder, recurrent, moderate: Secondary | ICD-10-CM | POA: Insufficient documentation

## 2024-06-21 MED ORDER — ESCITALOPRAM OXALATE 10 MG PO TABS: 10.0000 mg | ORAL_TABLET | Freq: Every day | ORAL | 0 refills | Status: DC

## 2024-06-21 MED ORDER — BUSPIRONE HCL 5 MG PO TABS: 5.0000 mg | ORAL_TABLET | Freq: Three times a day (TID) | ORAL | 0 refills | Status: AC | PRN

## 2024-06-21 NOTE — Progress Notes (Signed)
 BP 124/72   Pulse 82   Resp 16   Ht 5' 4 (1.626 m)   Wt 204 lb (92.5 kg)   LMP 05/29/2024 (Exact Date)   SpO2 100%   BMI 35.02 kg/m    Subjective:    Patient ID: Loretta Jensen, female    DOB: 1985-12-24, 39 y.o.   MRN: [id number]  HPI: Loretta Jensen is a 38 y.o. female  Chief Complaint  Patient presents with   Headache    On/off the last month, w/o OTC meds lasts all day. Under a lot of stress currently   Dizziness    X 1.5 weeks   Discussed the use of AI scribe software for clinical note transcription with the patient, who gave verbal consent to proceed.  History of Present Illness Loretta Jensen is a 38 year old female who presents with increased frequency of panic attacks and stress-related headaches.  Panic attacks - Increased frequency of panic attacks, occurring almost daily at home and every other day at work - Episodes characterized by a sense of breakdown - Symptoms exacerbated when her child gets into trouble at school - No prior use of mental health medications - Symptoms have persisted for approximately sixteen years, coinciding with the death of her mother  Stress-related headaches - Frequent headaches attributed to stress - Headaches occur alongside panic attacks - Recent increase in headache frequency  Psychosocial stressors - Significant stress related to work environment, particularly due to working with her child's father with whom she is not getting along - Additional stress from challenges of raising an autistic child - Difficulty coping with the loss of her mother, as she cannot call her for support         06/21/2024   10:21 AM 04/08/2024    8:35 AM 06/12/2023    8:11 AM  Depression screen PHQ 2/9  Decreased Interest 2 0 0  Down, Depressed, Hopeless 3 0 0  PHQ - 2 Score 5 0 0  Altered sleeping 3  0  Tired, decreased energy 2  0  Change in appetite 2  0  Feeling bad or failure about yourself  3  0  Trouble concentrating  0  0  Moving slowly or fidgety/restless 3  0  Suicidal thoughts 1  0  PHQ-9 Score 19  0  Difficult doing work/chores Very difficult  Not difficult at all       06/21/2024   10:22 AM  GAD 7 : Generalized Anxiety Score  Nervous, Anxious, on Edge 3  Control/stop worrying 3  Worry too much - different things 3  Trouble relaxing 3  Restless 2  Easily annoyed or irritable 3  Afraid - awful might happen 3  Total GAD 7 Score 20  Anxiety Difficulty Very difficult     Relevant past medical, surgical, family and social history reviewed and updated as indicated. Interim medical history since our last visit reviewed. Allergies and medications reviewed and updated.  Review of Systems  Ten systems reviewed and is negative except as mentioned in HPI      Objective:      BP 124/72   Pulse 82   Resp 16   Ht 5' 4 (1.626 m)   Wt 204 lb (92.5 kg)   LMP 05/29/2024 (Exact Date)   SpO2 100%   BMI 35.02 kg/m    Wt Readings from Last 3 Encounters:  06/21/24 204 lb (92.5 kg)  05/30/24 205 lb (93 kg)  04/08/24 202 lb (91.6  kg)    Physical Exam GENERAL: Alert, cooperative, well developed, no acute distress HEENT: Normocephalic, normal oropharynx, moist mucous membranes CHEST: Clear to auscultation bilaterally, No wheezes, rhonchi, or crackles CARDIOVASCULAR: Normal heart rate and rhythm, S1 and S2 normal without murmurs ABDOMEN: Soft, non-tender, non-distended, without organomegaly, Normal bowel sounds EXTREMITIES: No cyanosis or edema NEUROLOGICAL: Cranial nerves grossly intact, Moves all extremities without gross motor or sensory deficit          Assessment & Plan:   Problem List Items Addressed This Visit       Other   Anxiety - Primary   Relevant Medications   escitalopram (LEXAPRO) 10 MG tablet   busPIRone (BUSPAR) 5 MG tablet   Moderate episode of recurrent major depressive disorder (HCC)   Relevant Medications   escitalopram (LEXAPRO) 10 MG tablet   busPIRone  (BUSPAR) 5 MG tablet     Assessment and Plan Assessment & Plan Generalized anxiety disorder with panic attacks Generalized anxiety disorder with frequent panic attacks, occurring almost daily at home and every other day at work. Symptoms exacerbated by stress related to personal and work situations. No prior mental health medication use. Lexapro is preferred due to fewer side effects and quicker onset (2-4 weeks) compared to other medications (4-6 weeks). Buspirone is chosen for acute symptoms as it does not cause sedation. - Prescribe Lexapro 10 mg daily for anxiety management. - Prescribe Buspirone 5 mg as needed for acute anxiety symptoms. - Administer anxiety and depression screening to establish baseline. - Schedule follow-up appointment in four weeks to assess response to treatment.  Headache associated with stress Headaches likely secondary to stress, occurring frequently in conjunction with anxiety and panic attacks. Anticipated improvement with anxiety management. - Monitor headache frequency and severity in relation to anxiety treatment. - Reassess headache symptoms at follow-up appointment in four weeks.  Depression Positive depression screening. Symptoms likely related to stress and anxiety. No prior mental health medication use. - Reassess depression symptoms at follow-up appointment in four weeks.        Follow up plan: Return in about 4 weeks (around 07/19/2024) for follow up.

## 2024-06-30 ENCOUNTER — Ambulatory Visit

## 2024-06-30 DIAGNOSIS — Z23 Encounter for immunization: Secondary | ICD-10-CM | POA: Diagnosis not present

## 2024-07-09 ENCOUNTER — Ambulatory Visit (INDEPENDENT_AMBULATORY_CARE_PROVIDER_SITE_OTHER): Admitting: Nurse Practitioner

## 2024-07-09 ENCOUNTER — Ambulatory Visit: Admitting: Family Medicine

## 2024-07-09 ENCOUNTER — Encounter: Payer: Self-pay | Admitting: Nurse Practitioner

## 2024-07-09 VITALS — BP 118/82 | HR 60 | Temp 98.0°F | Ht 64.0 in | Wt 203.0 lb

## 2024-07-09 DIAGNOSIS — N920 Excessive and frequent menstruation with regular cycle: Secondary | ICD-10-CM

## 2024-07-09 DIAGNOSIS — D500 Iron deficiency anemia secondary to blood loss (chronic): Secondary | ICD-10-CM | POA: Diagnosis not present

## 2024-07-09 DIAGNOSIS — J4541 Moderate persistent asthma with (acute) exacerbation: Secondary | ICD-10-CM

## 2024-07-09 DIAGNOSIS — F419 Anxiety disorder, unspecified: Secondary | ICD-10-CM | POA: Diagnosis not present

## 2024-07-09 MED ORDER — ESCITALOPRAM OXALATE 10 MG PO TABS: 10.0000 mg | ORAL_TABLET | Freq: Every day | ORAL | 1 refills | Status: AC

## 2024-07-09 MED ORDER — NORETHIN ACE-ETH ESTRAD-FE 1-20 MG-MCG PO TABS: 1.0000 | ORAL_TABLET | Freq: Every day | ORAL | 3 refills | Status: AC

## 2024-07-09 MED ORDER — ALBUTEROL SULFATE HFA 108 (90 BASE) MCG/ACT IN AERS: 2.0000 | INHALATION_SPRAY | RESPIRATORY_TRACT | 2 refills | Status: AC | PRN

## 2024-07-09 MED ORDER — TRELEGY ELLIPTA 100-62.5-25 MCG/ACT IN AEPB: 1.0000 | INHALATION_SPRAY | Freq: Every day | RESPIRATORY_TRACT | 3 refills | Status: AC

## 2024-07-09 NOTE — Assessment & Plan Note (Signed)
 Continue taking a multi-vitamin with iron. Continue taking birth control and following up with OBGYN as needed.

## 2024-07-09 NOTE — Assessment & Plan Note (Signed)
 Refill sent in of Lexapro 10mg 

## 2024-07-09 NOTE — Assessment & Plan Note (Signed)
 Refills sent in for Albuterol  and Trelegy Ellipta  inhaler.

## 2024-07-09 NOTE — Assessment & Plan Note (Signed)
 Refills of Albuterol  and Trelegy Ellipta  sent to pharmacy

## 2024-07-09 NOTE — Progress Notes (Signed)
 BP 118/82   Pulse 60   Temp 98 F (36.7 C)   Ht 5' 4 (1.626 m)   Wt 203 lb (92.1 kg)   SpO2 99%   BMI 34.84 kg/m    Subjective:    Patient ID: Loretta Jensen, female    DOB: 02/04/1986, 38 y.o.   MRN: [id number]  HPI: Loretta Jensen is a 38 y.o. female with a history of menorrhagia with regular cycles, iron deficiency anemia due to chronic blood loss, anxiety, and moderate asthma who presents today for a 3 month follow-up. She has been experiencing heavy menstrual bleeding which has improved since starting a new oral contraceptive pill, norethindrone -ethinyl estradiol -FE 1-20 MG-MCG 3 months ago. She reports she use to saturate 3-4 pads daily and now she is only using about 2 throughtout the day. Her periods have been lasting 7 days. She denies any abnormal cramping. She is followed by OBGYN. At her recent appointment they found a small fibroid on ultrasound, approximately 1.5 cm, located in an area unlikely to cause significant symptoms and no immediate intervention required.  For her anxiety she is tolerating the Lexapro 10 mg daily and is due for a refill. She also reports needing a refill on her albuterol  and trelegy ellipta  for her moderate persistent asthma and moderate persistent reactive airway disease. She denies any recent exacerbations or hospital visits for symptoms.       07/09/2024    9:24 AM 06/21/2024   10:21 AM 04/08/2024    8:35 AM  Depression screen PHQ 2/9  Decreased Interest 2 2 0  Down, Depressed, Hopeless 2 3 0  PHQ - 2 Score 4 5 0  Altered sleeping 3 3   Tired, decreased energy 2 2   Change in appetite 2 2   Feeling bad or failure about yourself  3 3   Trouble concentrating 0 0   Moving slowly or fidgety/restless 2 3   Suicidal thoughts 2 1   PHQ-9 Score 18 19   Difficult doing work/chores Somewhat difficult Very difficult        07/09/2024    9:25 AM 06/21/2024   10:22 AM  GAD 7 : Generalized Anxiety Score  Nervous, Anxious, on Edge 3 3   Control/stop worrying 3 3  Worry too much - different things 3 3  Trouble relaxing 2 3  Restless 2 2  Easily annoyed or irritable 3 3  Afraid - awful might happen 2 3  Total GAD 7 Score 18 20  Anxiety Difficulty Somewhat difficult Very difficult     Relevant past medical, surgical, family and social history reviewed and updated as indicated. Interim medical history since our last visit reviewed. Allergies and medications reviewed and updated.  Review of Systems  Ten systems reviewed and is negative except as mentioned in HPI      Objective:     BP 118/82   Pulse 60   Temp 98 F (36.7 C)   Ht 5' 4 (1.626 m)   Wt 203 lb (92.1 kg)   SpO2 99%   BMI 34.84 kg/m    Wt Readings from Last 3 Encounters:  07/09/24 203 lb (92.1 kg)  06/21/24 204 lb (92.5 kg)  05/30/24 205 lb (93 kg)    Physical Exam Vitals reviewed.  Constitutional:      Appearance: Normal appearance.  HENT:     Head: Normocephalic.  Cardiovascular:     Rate and Rhythm: Normal rate and regular rhythm.  Pulmonary:  Effort: Pulmonary effort is normal.     Breath sounds: Normal breath sounds.  Musculoskeletal:        General: Normal range of motion.  Skin:    General: Skin is warm and dry.  Neurological:     General: No focal deficit present.     Mental Status: She is alert and oriented to person, place, and time. Mental status is at baseline.  Psychiatric:        Mood and Affect: Mood normal.        Behavior: Behavior normal.        Thought Content: Thought content normal.        Judgment: Judgment normal.      Results for orders placed or performed during the hospital encounter of 05/30/24  Group A Strep by PCR   Collection Time: 05/30/24  1:39 PM   Specimen: Throat; Sterile Swab  Result Value Ref Range   Group A Strep by PCR NOT DETECTED NOT DETECTED  SARS Coronavirus 2 by RT PCR (hospital order, performed in [facility] hospital lab) *cepheid single result test* Anterior Nasal Swab    Collection Time: 05/30/24  1:51 PM   Specimen: Anterior Nasal Swab  Result Value Ref Range   SARS Coronavirus 2 by RT PCR NEGATIVE NEGATIVE          Assessment & Plan:   Problem List Items Addressed This Visit       Respiratory   Reactive airway disease   Refills of Albuterol  and Trelegy Ellipta  sent to pharmacy       Relevant Medications   albuterol  (VENTOLIN  HFA) 108 (90 Base) MCG/ACT inhaler   Fluticasone -Umeclidin-Vilant (TRELEGY ELLIPTA ) 100-62.5-25 MCG/ACT AEPB   Moderate asthma with exacerbation   Refills sent in for Albuterol  and Trelegy Ellipta  inhaler.       Relevant Medications   albuterol  (VENTOLIN  HFA) 108 (90 Base) MCG/ACT inhaler   Fluticasone -Umeclidin-Vilant (TRELEGY ELLIPTA ) 100-62.5-25 MCG/ACT AEPB     Other   Menorrhagia with regular cycle - Primary   Continue taking a multi-vitamin with iron. Continue taking birth control and following up with OBGYN as needed.       Relevant Medications   norethindrone -ethinyl estradiol -FE (JUNEL FE 1/20) 1-20 MG-MCG tablet   Iron deficiency anemia due to chronic blood loss   Continue taking a multi-vitamin with iron. Continue taking birth control and following up with OBGYN as needed.       Anxiety   Refill sent in of Lexapro 10mg        Relevant Medications   escitalopram (LEXAPRO) 10 MG tablet    -Continue taking norethindrone -ethinyl estradiol -FE 1-20 MG-MCG birth control. Refill sent in. Follow-up with OBGYN as needed.  -Continue taking Lexapro 10 mg. Refill sent in. PHQ-9 and GAD-7 scores were both 18. Plan to recheck at next appointment or sooner for new/worsening concerns.  -Continue taking Albuterol  and Trelegy Ellipta  inhaler as needed for asthma and reactive airway disease. Advised to avoid triggers. Refills sent in. -Plan to see patient in 6 months for anxiety med recheck.           Follow up plan: Return in about 6 months (around 01/07/2025) for med recheck .   I have reviewed this  encounter including the documentation in this note and/or discussed this patient with the provider, Aislinn Womack, SNP, I am certifying that I agree with the content of this note as supervising/preceptor nurse practitioner.  Mliss Spray, FNP-C [facility] 07/09/2024, 10:27  AM

## 2024-07-15 ENCOUNTER — Ambulatory Visit: Admitting: Nurse Practitioner

## 2024-07-15 NOTE — Progress Notes (Unsigned)
 Not seen

## 2024-07-19 ENCOUNTER — Ambulatory Visit: Admitting: Nurse Practitioner

## 2024-07-29 ENCOUNTER — Encounter: Payer: Self-pay | Admitting: Nurse Practitioner

## 2024-07-29 ENCOUNTER — Other Ambulatory Visit: Payer: Self-pay | Admitting: Nurse Practitioner

## 2024-07-29 DIAGNOSIS — F419 Anxiety disorder, unspecified: Secondary | ICD-10-CM

## 2024-07-30 NOTE — Telephone Encounter (Signed)
 Requested Prescriptions  Pending Prescriptions Disp Refills   busPIRone (BUSPAR) 5 MG tablet [Pharmacy Med Name: BUSPIRONE 5MG  TABLETS] 90 tablet 0    Sig: TAKE 1 TABLET(5 MG) BY MOUTH THREE TIMES DAILY AS NEEDED     Psychiatry: Anxiolytics/Hypnotics - Non-controlled Passed - 07/30/2024  2:06 PM      Passed - Valid encounter within last 12 months    Recent Outpatient Visits           2 weeks ago Erroneous encounter - disregard   [facility] Gareth Mliss FALCON, FNP   3 weeks ago Menorrhagia with regular cycle   [facility] Gareth Mliss FALCON, FNP   1 month ago Anxiety   [facility] Gareth Mliss FALCON, FNP   3 months ago Well adult exam   [facility] Leavy Mole, PA-C   7 months ago Screen for STD (sexually transmitted disease)   [facility] Leavy Mole, PA-C

## 2024-08-06 ENCOUNTER — Encounter: Payer: Self-pay | Admitting: Emergency Medicine

## 2024-08-06 ENCOUNTER — Ambulatory Visit
Admission: EM | Admit: 2024-08-06 | Discharge: 2024-08-06 | Disposition: A | Discharge status: Home / Self Care | Attending: Family Medicine | Admitting: Family Medicine

## 2024-08-06 DIAGNOSIS — J40 Bronchitis, not specified as acute or chronic: Secondary | ICD-10-CM

## 2024-08-06 DIAGNOSIS — J0110 Acute frontal sinusitis, unspecified: Secondary | ICD-10-CM | POA: Diagnosis not present

## 2024-08-06 DIAGNOSIS — J45901 Unspecified asthma with (acute) exacerbation: Secondary | ICD-10-CM

## 2024-08-06 MED ORDER — PROMETHAZINE-DM 6.25-15 MG/5ML PO SYRP: 5.0000 mL | ORAL_SOLUTION | Freq: Four times a day (QID) | ORAL | 0 refills | Status: AC | PRN

## 2024-08-06 MED ORDER — AMOXICILLIN-POT CLAVULANATE 875-125 MG PO TABS: 1.0000 | ORAL_TABLET | Freq: Two times a day (BID) | ORAL | 0 refills | Status: AC

## 2024-08-06 MED ORDER — PREDNISONE 20 MG PO TABS: 40.0000 mg | ORAL_TABLET | Freq: Every day | ORAL | 0 refills | Status: AC

## 2024-08-06 NOTE — Discharge Instructions (Signed)
 Start Augmentin  twice daily for 7 days.  Take prednisone  daily as well to help with your asthma symptoms and continue your inhaler as needed.  Promethazine  DM as needed for cough.  This medication will make you drowsy.  No alcohol or driving while on this medication.  Nasal rinses as tolerated.  Lots of rest and fluids.  Follow-up with your PCP in 2 to 3 days for recheck.  Please go to the ER for any worsening symptoms.  I hope you feel better soon!

## 2024-08-06 NOTE — ED Provider Notes (Signed)
 [facility]    CSN: [id number] Arrival date & time: 08/06/24  0813      History   Chief Complaint Chief Complaint  Patient presents with   Otalgia   Cough    HPI Loretta Jensen is a 38 y.o. female  presents for evaluation of URI symptoms for 6 days. Patient reports associated symptoms of cough, congestion, sinus pressure/pain, wheezing/shortness of breath, ST, subjective fever, posttussive vomiting. Denies diarrhea, body aches. Patient does  have a hx of asthma.  Has an albuterol  inhaler has been using with temporary improvement in symptoms.  Patient is not an active smoker.   Reports  sick contacts via family.  Pt has taken nothing OTC for symptoms. Pt has no other concerns at this time.    Otalgia Associated symptoms: congestion, cough, fever and sore throat   Cough Associated symptoms: ear pain, fever, shortness of breath and sore throat     Past Medical History:  Diagnosis Date   Allergy    COVID 10/05/2020   Family history of breast cancer    Headache    History of cesarean delivery 12/04/2016   Obesity affecting pregnancy 03/04/2018   Rh negative state in antepartum period 07/09/2018   Short interval between pregnancies affecting pregnancy, antepartum 01/19/2018   Status post cesarean delivery 03/02/2017   Type O blood, Rh negative     Patient Active Problem List   Diagnosis Date Noted   Anxiety 06/21/2024   Moderate episode of recurrent major depressive disorder (HCC) 06/21/2024   Prediabetes 04/08/2024   Iron deficiency anemia due to chronic blood loss 04/08/2024   Moderate asthma with exacerbation 10/10/2023   Gastroesophageal reflux disease 10/10/2023   Genetic testing 05/19/2023   Family history of thyroid disease 03/03/2023   Reactive airway disease 03/17/2020   Chronic midline low back pain without sciatica 03/17/2020   Family history of breast cancer    Seasonal allergic rhinitis 01/19/2019   Family history of breast cancer in mother  01/19/2019   Menorrhagia with regular cycle 01/19/2019   Class 1 obesity with body mass index (BMI) of 34.0 to 34.9 in adult 03/04/2018   BMI 34.0-34.9,adult 03/04/2018   Migraine headache 01/23/2018   Occupational exposure to dust 05/23/2015    Past Surgical History:  Procedure Laterality Date   CESAREAN SECTION  07/10/2018   CESAREAN SECTION N/A 03/02/2017   Procedure: CESAREAN SECTION;  Surgeon: Leonce Garnette BIRCH, MD;  Location: [facility] ORS;  Service: Obstetrics;  Laterality: N/A;   CESAREAN SECTION N/A 07/10/2018   Procedure: CESAREAN SECTION;  Surgeon: Leonce Garnette BIRCH, MD;  Location: [facility] OR[facility];  Service: Obstetrics;  Laterality: N/A;   TUBAL LIGATION Bilateral 07/10/2018   Procedure: BILATERAL TUBAL LIGATION;  Surgeon: Leonce Garnette BIRCH, MD;  Location: [facility] OR[facility];  Service: Obstetrics;  Laterality: Bilateral;    OB History     Gravida  3   Para  3   Term  3   Preterm      AB      Living  3      SAB      IAB      Ectopic      Multiple  0   Live Births  3            Home Medications    Prior to Admission medications   Medication Sig Start Date End Date Taking? Authorizing Provider  amoxicillin -clavulanate (AUGMENTIN ) 875-125 MG tablet Take 1 tablet by mouth every 12 (twelve) hours. 08/06/24  Yes Yi Falletta, Jodi R, NP  predniSONE  (DELTASONE ) 20 MG tablet Take 2 tablets (40 mg total) by mouth daily with breakfast for 5 days. 08/06/24 08/11/24 Yes Georjean Toya, Jodi R, NP  promethazine -dextromethorphan (PROMETHAZINE -DM) 6.25-15 MG/5ML syrup Take 5 mLs by mouth 4 (four) times daily as needed for cough. 08/06/24  Yes Loraina Stauffer, Jodi R, NP  albuterol  (VENTOLIN  HFA) 108 (90 Base) MCG/ACT inhaler Inhale 2 puffs into the lungs every 4 (four) hours as needed for wheezing or shortness of breath. 07/09/24   Pender, Julie F, FNP  busPIRone (BUSPAR) 5 MG tablet Take 1 tablet (5 mg total) by mouth 3 (three) times daily as needed. 06/21/24   Gareth Mliss FALCON, FNP  cetirizine  (ZYRTEC ) 10  MG tablet Take 1 tablet (10 mg total) by mouth daily. 11/05/22   Tapia, Leisa, PA-C  escitalopram (LEXAPRO) 10 MG tablet Take 1 tablet (10 mg total) by mouth daily. 07/09/24   Pender, Julie F, FNP  fluticasone  (FLONASE ) 50 MCG/ACT nasal spray Place 2 sprays into both nostrils daily. 01/19/19   Lavina Damien BRAVO, FNP  Fluticasone -Umeclidin-Vilant (TRELEGY ELLIPTA ) 100-62.5-25 MCG/ACT AEPB Inhale 1 puff into the lungs daily. 07/09/24   Pender, Julie F, FNP  ibuprofen  (ADVIL ) 600 MG tablet TAKE 1 TABLET BY MOUTH EVERY 8 HOURS AS NEEDED FOR HEADACHE, MILD TO MODERATE PAIN OR CRAMPING 03/11/23   Tapia, Leisa, PA-C  ipratropium (ATROVENT ) 0.06 % nasal spray Place 2 sprays into both nostrils 4 (four) times daily. 01/07/24   Bernardino Ditch, NP  ipratropium-albuterol  (DUONEB) 0.5-2.5 (3) MG/3ML SOLN Take 3 mLs by nebulization 3 (three) times daily as needed. 12/02/22   Tapia, Leisa, PA-C  levocetirizine (XYZAL ) 5 MG tablet Take 1 tablet (5 mg total) by mouth every evening. 10/10/23   Mecum, Erin E, PA-C  montelukast  (SINGULAIR ) 10 MG tablet Take 1 tablet (10 mg total) by mouth at bedtime. 10/10/23   Mecum, Erin E, PA-C  norethindrone -ethinyl estradiol -FE (JUNEL FE 1/20) 1-20 MG-MCG tablet Take 1 tablet by mouth daily. 07/09/24   Gareth Mliss FALCON, FNP  Olopatadine  HCl 0.2 % SOLN Use 1 drop in each eye BID 01/18/21   Burnette, Jennifer M, PA-C  ondansetron  (ZOFRAN -ODT) 8 MG disintegrating tablet Take 1 tablet (8 mg total) by mouth every 8 (eight) hours as needed for nausea or vomiting. 01/07/24   Bernardino Ditch, NP  sucralfate  (CARAFATE ) 1 g tablet Take 1 tablet (1 g total) by mouth 4 (four) times daily. 06/27/22   Floy Roberts, MD    Family History Family History  Problem Relation Age of Onset   Breast cancer Mother 93   Cancer Mother    Liver cancer Maternal Grandfather 64   Cancer Maternal Grandfather    Stomach cancer Paternal Grandmother    Breast cancer Other 30       2018 - neg [facility]   Breast cancer Other  55       2013   Breast cancer Other        1990s   Breast cancer Other     Social History Social History   Tobacco Use   Smoking status: Never   Smokeless tobacco: Never  Vaping Use   Vaping status: Never Used  Substance Use Topics   Alcohol use: Yes    Alcohol/week: 0.0 standard drinks of alcohol    Comment: occasionally   Drug use: No     Allergies   Metronidazole    Review of Systems Review of Systems  Constitutional:  Positive for fever.  HENT:  Positive for congestion, ear pain and sore throat.   Respiratory:  Positive for cough and shortness of breath.      Physical Exam Triage Vital Signs ED Triage Vitals  Encounter Vitals Group     BP 08/06/24 0821 108/69     Girls Systolic BP Percentile --      Girls Diastolic BP Percentile --      Boys Systolic BP Percentile --      Boys Diastolic BP Percentile --      Pulse Rate 08/06/24 0821 88     Resp 08/06/24 0821 16     Temp 08/06/24 0821 99.1 F (37.3 C)     Temp Source 08/06/24 0821 Oral     SpO2 08/06/24 0821 97 %     Weight 08/06/24 0820 203 lb 0.7 oz (92.1 kg)     Height 08/06/24 0820 5' 4 (1.626 m)     Head Circumference --      Peak Flow --      Pain Score 08/06/24 0820 10     Pain Loc --      Pain Education --      Exclude from Growth Chart --    No data found.  Updated Vital Signs BP 108/69 (BP Location: Right Arm)   Pulse 88   Temp 99.1 F (37.3 C) (Oral)   Resp 16   Ht 5' 4 (1.626 m)   Wt 203 lb 0.7 oz (92.1 kg)   SpO2 97%   BMI 34.85 kg/m   Visual Acuity Right Eye Distance:   Left Eye Distance:   Bilateral Distance:    Right Eye Near:   Left Eye Near:    Bilateral Near:     Physical Exam Vitals and nursing note reviewed.  Constitutional:      General: She is not in acute distress.    Appearance: She is well-developed. She is not ill-appearing.  HENT:     Head: Normocephalic and atraumatic.     Right Ear: Tympanic membrane and ear canal normal.     Left Ear: Tympanic  membrane and ear canal normal.     Nose: Congestion present.     Right Turbinates: Swollen and pale.     Left Turbinates: Swollen and pale.     Right Sinus: Frontal sinus tenderness present. No maxillary sinus tenderness.     Left Sinus: Frontal sinus tenderness present. No maxillary sinus tenderness.     Mouth/Throat:     Mouth: Mucous membranes are moist.     Pharynx: Oropharynx is clear. Uvula midline. No oropharyngeal exudate or posterior oropharyngeal erythema.     Tonsils: No tonsillar exudate or tonsillar abscesses.  Eyes:     Conjunctiva/sclera: Conjunctivae normal.     Pupils: Pupils are equal, round, and reactive to light.  Cardiovascular:     Rate and Rhythm: Normal rate and regular rhythm.     Heart sounds: Normal heart sounds.  Pulmonary:     Effort: Pulmonary effort is normal.     Breath sounds: Normal breath sounds. No wheezing or rhonchi.  Musculoskeletal:     Cervical back: Normal range of motion and neck supple.  Lymphadenopathy:     Cervical: No cervical adenopathy.  Skin:    General: Skin is warm and dry.  Neurological:     General: No focal deficit present.     Mental Status: She is alert and oriented to person, place, and time.  Psychiatric:  Mood and Affect: Mood normal.        Behavior: Behavior normal.      UC Treatments / Results  Labs (all labs ordered are listed, but only abnormal results are displayed) Labs Reviewed - No data to display  EKG   Radiology No results found.  Procedures Procedures (including critical care time)  Medications Ordered in UC Medications - No data to display  Initial Impression / Assessment and Plan / UC Course  I have reviewed the triage vital signs and the nursing notes.  Pertinent labs & imaging results that were available during my care of the patient were reviewed by me and considered in my medical decision making (see chart for details).     Discussed sinusitis/bronchitis with asthma  exacerbation.  Will do prednisone  daily for 5 days.  Start Augmentin  twice daily for 7 days.  Promethazine  DM as needed for cough, side effect profile reviewed.  Encouraged rest fluids and PCP follow-up 2 to 3 days for recheck.  ER precautions reviewed. Final Clinical Impressions(s) / UC Diagnoses   Final diagnoses:  Acute frontal sinusitis, recurrence not specified  Bronchitis  Moderate asthma with exacerbation, unspecified whether persistent     Discharge Instructions      Start Augmentin  twice daily for 7 days.  Take prednisone  daily as well to help with your asthma symptoms and continue your inhaler as needed.  Promethazine  DM as needed for cough.  This medication will make you drowsy.  No alcohol or driving while on this medication.  Nasal rinses as tolerated.  Lots of rest and fluids.  Follow-up with your PCP in 2 to 3 days for recheck.  Please go to the ER for any worsening symptoms.  I hope you feel better soon!    ED Prescriptions     Medication Sig Dispense Auth. Provider   amoxicillin -clavulanate (AUGMENTIN ) 875-125 MG tablet Take 1 tablet by mouth every 12 (twelve) hours. 14 tablet Frank Pilger, Jodi R, NP   predniSONE  (DELTASONE ) 20 MG tablet Take 2 tablets (40 mg total) by mouth daily with breakfast for 5 days. 10 tablet Jelani Trueba, Jodi R, NP   promethazine -dextromethorphan (PROMETHAZINE -DM) 6.25-15 MG/5ML syrup Take 5 mLs by mouth 4 (four) times daily as needed for cough. 118 mL Creedon Danielski, Jodi R, NP      PDMP not reviewed this encounter.   Loreda Myla SAUNDERS, NP 08/06/24 [phone]

## 2024-08-06 NOTE — ED Triage Notes (Signed)
 Pt c/o cough, bilateral ear pain, hoarseness, and subjective fever. Started about 6 days ago.
